# Patient Record
Sex: Female | Born: 1950 | Race: White | Hispanic: No | Marital: Married | Smoking: Never smoker
Health system: Southern US, Community
[De-identification: ages and names within clinical notes are randomized; demographics above are authoritative.]

## PROBLEM LIST (undated history)

## (undated) DIAGNOSIS — Z973 Presence of spectacles and contact lenses: Secondary | ICD-10-CM

## (undated) DIAGNOSIS — Z8489 Family history of other specified conditions: Secondary | ICD-10-CM

## (undated) DIAGNOSIS — M2550 Pain in unspecified joint: Secondary | ICD-10-CM

## (undated) DIAGNOSIS — R0989 Other specified symptoms and signs involving the circulatory and respiratory systems: Secondary | ICD-10-CM

## (undated) DIAGNOSIS — C73 Malignant neoplasm of thyroid gland: Secondary | ICD-10-CM

## (undated) DIAGNOSIS — H269 Unspecified cataract: Secondary | ICD-10-CM

## (undated) DIAGNOSIS — I10 Essential (primary) hypertension: Secondary | ICD-10-CM

## (undated) DIAGNOSIS — E079 Disorder of thyroid, unspecified: Secondary | ICD-10-CM

## (undated) DIAGNOSIS — C55 Malignant neoplasm of uterus, part unspecified: Secondary | ICD-10-CM

## (undated) DIAGNOSIS — E669 Obesity, unspecified: Secondary | ICD-10-CM

## (undated) DIAGNOSIS — K5900 Constipation, unspecified: Secondary | ICD-10-CM

## (undated) DIAGNOSIS — R197 Diarrhea, unspecified: Secondary | ICD-10-CM

## (undated) DIAGNOSIS — R109 Unspecified abdominal pain: Secondary | ICD-10-CM

## (undated) DIAGNOSIS — E785 Hyperlipidemia, unspecified: Secondary | ICD-10-CM

## (undated) DIAGNOSIS — G4730 Sleep apnea, unspecified: Secondary | ICD-10-CM

## (undated) DIAGNOSIS — Z8249 Family history of ischemic heart disease and other diseases of the circulatory system: Secondary | ICD-10-CM

## (undated) DIAGNOSIS — K649 Unspecified hemorrhoids: Secondary | ICD-10-CM

## (undated) DIAGNOSIS — K219 Gastro-esophageal reflux disease without esophagitis: Secondary | ICD-10-CM

## (undated) DIAGNOSIS — Z86718 Personal history of other venous thrombosis and embolism: Secondary | ICD-10-CM

## (undated) HISTORY — DX: Unspecified abdominal pain: R10.9

## (undated) HISTORY — DX: Hyperlipidemia, unspecified: E78.5

## (undated) HISTORY — DX: Unspecified cataract: H26.9

## (undated) HISTORY — PX: OTHER SURGICAL HISTORY: SHX169

## (undated) HISTORY — DX: Unspecified hemorrhoids: K64.9

## (undated) HISTORY — DX: Essential (primary) hypertension: I10

## (undated) HISTORY — DX: Malignant neoplasm of thyroid gland: C73

## (undated) HISTORY — DX: Presence of spectacles and contact lenses: Z97.3

## (undated) HISTORY — DX: Personal history of other venous thrombosis and embolism: Z86.718

## (undated) HISTORY — DX: Constipation, unspecified: K59.00

## (undated) HISTORY — DX: Malignant neoplasm of uterus, part unspecified: C55

## (undated) HISTORY — DX: Family history of ischemic heart disease and other diseases of the circulatory system: Z82.49

## (undated) HISTORY — PX: CHOLECYSTECTOMY: SHX55

## (undated) HISTORY — PX: APPENDECTOMY: SHX54

## (undated) HISTORY — DX: Obesity, unspecified: E66.9

## (undated) HISTORY — PX: HERNIA REPAIR: SHX51

## (undated) HISTORY — DX: Pain in unspecified joint: M25.50

## (undated) HISTORY — DX: Sleep apnea, unspecified: G47.30

## (undated) HISTORY — DX: Disorder of thyroid, unspecified: E07.9

## (undated) HISTORY — DX: Diarrhea, unspecified: R19.7

## (undated) HISTORY — DX: Other specified symptoms and signs involving the circulatory and respiratory systems: R09.89

## (undated) HISTORY — DX: Gastro-esophageal reflux disease without esophagitis: K21.9

## (undated) HISTORY — PX: TONSILLECTOMY AND ADENOIDECTOMY: SUR1326

## (undated) HISTORY — PX: ABDOMINAL HYSTERECTOMY: SHX81

---

## 1997-08-30 ENCOUNTER — Ambulatory Visit: Admission: RE | Admit: 1997-08-30 | Discharge: 1997-08-30 | Payer: Self-pay | Admitting: Internal Medicine

## 1998-08-29 ENCOUNTER — Other Ambulatory Visit: Admission: RE | Admit: 1998-08-29 | Discharge: 1998-08-29 | Payer: Self-pay | Admitting: Obstetrics and Gynecology

## 1999-10-22 ENCOUNTER — Other Ambulatory Visit: Admission: RE | Admit: 1999-10-22 | Discharge: 1999-10-22 | Payer: Self-pay | Admitting: Obstetrics and Gynecology

## 2000-11-11 ENCOUNTER — Other Ambulatory Visit: Admission: RE | Admit: 2000-11-11 | Discharge: 2000-11-11 | Payer: Self-pay | Admitting: Obstetrics and Gynecology

## 2001-02-13 ENCOUNTER — Ambulatory Visit (HOSPITAL_BASED_OUTPATIENT_CLINIC_OR_DEPARTMENT_OTHER): Admission: RE | Admit: 2001-02-13 | Discharge: 2001-02-13 | Payer: Self-pay | Admitting: Internal Medicine

## 2001-02-13 ENCOUNTER — Encounter: Payer: Self-pay | Admitting: Internal Medicine

## 2001-03-01 ENCOUNTER — Ambulatory Visit (HOSPITAL_BASED_OUTPATIENT_CLINIC_OR_DEPARTMENT_OTHER): Admission: RE | Admit: 2001-03-01 | Discharge: 2001-03-01 | Payer: Self-pay | Admitting: Orthopedic Surgery

## 2001-03-01 ENCOUNTER — Encounter (INDEPENDENT_AMBULATORY_CARE_PROVIDER_SITE_OTHER): Payer: Self-pay | Admitting: *Deleted

## 2002-01-11 ENCOUNTER — Other Ambulatory Visit: Admission: RE | Admit: 2002-01-11 | Discharge: 2002-01-11 | Payer: Self-pay | Admitting: Obstetrics and Gynecology

## 2002-01-16 ENCOUNTER — Encounter: Payer: Self-pay | Admitting: Obstetrics and Gynecology

## 2002-01-16 ENCOUNTER — Ambulatory Visit (HOSPITAL_COMMUNITY): Admission: RE | Admit: 2002-01-16 | Discharge: 2002-01-16 | Payer: Self-pay | Admitting: Obstetrics and Gynecology

## 2002-02-13 ENCOUNTER — Ambulatory Visit (HOSPITAL_COMMUNITY): Admission: RE | Admit: 2002-02-13 | Discharge: 2002-02-13 | Payer: Self-pay | Admitting: General Surgery

## 2002-02-13 ENCOUNTER — Encounter: Payer: Self-pay | Admitting: General Surgery

## 2002-04-20 ENCOUNTER — Ambulatory Visit (HOSPITAL_COMMUNITY): Admission: RE | Admit: 2002-04-20 | Discharge: 2002-04-20 | Payer: Self-pay | Admitting: Internal Medicine

## 2002-06-21 ENCOUNTER — Ambulatory Visit (HOSPITAL_COMMUNITY): Admission: RE | Admit: 2002-06-21 | Discharge: 2002-06-21 | Payer: Self-pay | Admitting: Specialist

## 2002-08-29 ENCOUNTER — Ambulatory Visit (HOSPITAL_COMMUNITY): Admission: RE | Admit: 2002-08-29 | Discharge: 2002-08-29 | Payer: Self-pay | Admitting: Specialist

## 2002-12-28 ENCOUNTER — Encounter: Payer: Self-pay | Admitting: Endocrinology

## 2002-12-28 ENCOUNTER — Ambulatory Visit (HOSPITAL_COMMUNITY): Admission: RE | Admit: 2002-12-28 | Discharge: 2002-12-28 | Payer: Self-pay | Admitting: Endocrinology

## 2003-01-04 ENCOUNTER — Encounter: Payer: Self-pay | Admitting: Obstetrics and Gynecology

## 2003-01-04 ENCOUNTER — Encounter: Admission: RE | Admit: 2003-01-04 | Discharge: 2003-01-04 | Payer: Self-pay | Admitting: Obstetrics and Gynecology

## 2003-02-22 ENCOUNTER — Other Ambulatory Visit: Admission: RE | Admit: 2003-02-22 | Discharge: 2003-02-22 | Payer: Self-pay | Admitting: Obstetrics and Gynecology

## 2003-05-03 ENCOUNTER — Encounter: Admission: RE | Admit: 2003-05-03 | Discharge: 2003-05-03 | Payer: Self-pay | Admitting: Surgery

## 2003-05-14 ENCOUNTER — Encounter: Admission: RE | Admit: 2003-05-14 | Discharge: 2003-08-12 | Payer: Self-pay | Admitting: Surgery

## 2003-05-24 ENCOUNTER — Ambulatory Visit (HOSPITAL_COMMUNITY): Admission: RE | Admit: 2003-05-24 | Discharge: 2003-05-24 | Payer: Self-pay | Admitting: Surgery

## 2003-08-13 ENCOUNTER — Encounter: Admission: RE | Admit: 2003-08-13 | Discharge: 2003-11-11 | Payer: Self-pay | Admitting: Surgery

## 2003-10-25 ENCOUNTER — Emergency Department (HOSPITAL_COMMUNITY): Admission: EM | Admit: 2003-10-25 | Discharge: 2003-10-25 | Payer: Self-pay | Admitting: Family Medicine

## 2003-11-11 ENCOUNTER — Encounter: Admission: RE | Admit: 2003-11-11 | Discharge: 2003-11-11 | Payer: Self-pay | Admitting: Surgery

## 2004-01-03 ENCOUNTER — Ambulatory Visit (HOSPITAL_COMMUNITY): Admission: RE | Admit: 2004-01-03 | Discharge: 2004-01-03 | Payer: Self-pay | Admitting: Endocrinology

## 2004-01-23 ENCOUNTER — Ambulatory Visit (HOSPITAL_COMMUNITY): Admission: RE | Admit: 2004-01-23 | Discharge: 2004-01-23 | Payer: Self-pay | Admitting: Endocrinology

## 2004-01-23 ENCOUNTER — Encounter (INDEPENDENT_AMBULATORY_CARE_PROVIDER_SITE_OTHER): Payer: Self-pay | Admitting: Specialist

## 2004-03-13 ENCOUNTER — Encounter: Admission: RE | Admit: 2004-03-13 | Discharge: 2004-03-13 | Payer: Self-pay | Admitting: Obstetrics and Gynecology

## 2004-05-04 ENCOUNTER — Other Ambulatory Visit: Admission: RE | Admit: 2004-05-04 | Discharge: 2004-05-04 | Payer: Self-pay | Admitting: Obstetrics and Gynecology

## 2004-11-06 ENCOUNTER — Ambulatory Visit (HOSPITAL_COMMUNITY): Admission: RE | Admit: 2004-11-06 | Discharge: 2004-11-06 | Payer: Self-pay | Admitting: Endocrinology

## 2004-11-27 ENCOUNTER — Encounter (INDEPENDENT_AMBULATORY_CARE_PROVIDER_SITE_OTHER): Payer: Self-pay | Admitting: *Deleted

## 2004-11-27 ENCOUNTER — Ambulatory Visit (HOSPITAL_COMMUNITY): Admission: RE | Admit: 2004-11-27 | Discharge: 2004-11-27 | Payer: Self-pay | Admitting: Interventional Cardiology

## 2005-04-13 ENCOUNTER — Encounter: Admission: RE | Admit: 2005-04-13 | Discharge: 2005-04-13 | Payer: Self-pay | Admitting: Obstetrics and Gynecology

## 2005-05-23 ENCOUNTER — Encounter: Admission: RE | Admit: 2005-05-23 | Discharge: 2005-05-23 | Payer: Self-pay | Admitting: Specialist

## 2005-08-06 ENCOUNTER — Inpatient Hospital Stay (HOSPITAL_COMMUNITY): Admission: EM | Admit: 2005-08-06 | Discharge: 2005-08-17 | Payer: Self-pay | Admitting: Emergency Medicine

## 2005-08-25 ENCOUNTER — Encounter: Admission: RE | Admit: 2005-08-25 | Discharge: 2005-08-25 | Payer: Self-pay | Admitting: General Surgery

## 2005-08-26 ENCOUNTER — Inpatient Hospital Stay (HOSPITAL_COMMUNITY): Admission: EM | Admit: 2005-08-26 | Discharge: 2005-09-04 | Payer: Self-pay | Admitting: General Surgery

## 2006-02-07 ENCOUNTER — Other Ambulatory Visit: Admission: RE | Admit: 2006-02-07 | Discharge: 2006-02-07 | Payer: Self-pay | Admitting: Obstetrics & Gynecology

## 2006-02-23 ENCOUNTER — Ambulatory Visit: Admission: RE | Admit: 2006-02-23 | Discharge: 2006-02-23 | Payer: Self-pay | Admitting: Gynecologic Oncology

## 2006-03-01 ENCOUNTER — Ambulatory Visit: Admission: RE | Admit: 2006-03-01 | Discharge: 2006-05-30 | Payer: Self-pay | Admitting: Radiation Oncology

## 2006-03-02 ENCOUNTER — Ambulatory Visit (HOSPITAL_COMMUNITY): Admission: RE | Admit: 2006-03-02 | Discharge: 2006-03-02 | Payer: Self-pay | Admitting: Gynecologic Oncology

## 2006-04-15 ENCOUNTER — Encounter: Admission: RE | Admit: 2006-04-15 | Discharge: 2006-04-15 | Payer: Self-pay | Admitting: Obstetrics & Gynecology

## 2006-04-26 ENCOUNTER — Encounter: Payer: Self-pay | Admitting: Internal Medicine

## 2006-05-04 ENCOUNTER — Ambulatory Visit: Payer: Self-pay | Admitting: Internal Medicine

## 2006-05-19 ENCOUNTER — Ambulatory Visit: Payer: Self-pay

## 2006-05-19 ENCOUNTER — Encounter: Payer: Self-pay | Admitting: Cardiology

## 2006-05-20 ENCOUNTER — Ambulatory Visit: Payer: Self-pay

## 2006-05-23 ENCOUNTER — Ambulatory Visit: Payer: Self-pay | Admitting: Internal Medicine

## 2006-05-24 ENCOUNTER — Ambulatory Visit (HOSPITAL_COMMUNITY): Admission: RE | Admit: 2006-05-24 | Discharge: 2006-05-24 | Payer: Self-pay | Admitting: Gynecologic Oncology

## 2006-05-25 ENCOUNTER — Encounter (INDEPENDENT_AMBULATORY_CARE_PROVIDER_SITE_OTHER): Payer: Self-pay | Admitting: Specialist

## 2006-05-25 ENCOUNTER — Ambulatory Visit (HOSPITAL_COMMUNITY): Admission: RE | Admit: 2006-05-25 | Discharge: 2006-05-25 | Payer: Self-pay | Admitting: Gynecologic Oncology

## 2006-05-26 ENCOUNTER — Ambulatory Visit (HOSPITAL_COMMUNITY): Admission: RE | Admit: 2006-05-26 | Discharge: 2006-05-26 | Payer: Self-pay | Admitting: Gynecologic Oncology

## 2006-06-07 ENCOUNTER — Ambulatory Visit (HOSPITAL_COMMUNITY): Admission: RE | Admit: 2006-06-07 | Discharge: 2006-06-07 | Payer: Self-pay | Admitting: Endocrinology

## 2006-06-07 ENCOUNTER — Ambulatory Visit: Payer: Self-pay | Admitting: Vascular Surgery

## 2006-07-05 ENCOUNTER — Inpatient Hospital Stay (HOSPITAL_COMMUNITY): Admission: RE | Admit: 2006-07-05 | Discharge: 2006-07-07 | Payer: Self-pay | Admitting: General Surgery

## 2006-07-05 ENCOUNTER — Encounter (INDEPENDENT_AMBULATORY_CARE_PROVIDER_SITE_OTHER): Payer: Self-pay | Admitting: *Deleted

## 2006-08-30 ENCOUNTER — Ambulatory Visit: Admission: RE | Admit: 2006-08-30 | Discharge: 2006-08-30 | Payer: Self-pay | Admitting: Gynecologic Oncology

## 2006-12-13 ENCOUNTER — Ambulatory Visit (HOSPITAL_COMMUNITY): Admission: RE | Admit: 2006-12-13 | Discharge: 2006-12-13 | Payer: Self-pay | Admitting: General Surgery

## 2007-01-05 ENCOUNTER — Encounter: Admission: RE | Admit: 2007-01-05 | Discharge: 2007-04-05 | Payer: Self-pay | Admitting: General Surgery

## 2007-02-22 ENCOUNTER — Ambulatory Visit: Admission: RE | Admit: 2007-02-22 | Discharge: 2007-02-22 | Payer: Self-pay | Admitting: Gynecologic Oncology

## 2007-02-22 ENCOUNTER — Other Ambulatory Visit: Admission: RE | Admit: 2007-02-22 | Discharge: 2007-02-22 | Payer: Self-pay | Admitting: Gynecologic Oncology

## 2007-02-22 ENCOUNTER — Encounter: Payer: Self-pay | Admitting: Gynecologic Oncology

## 2007-03-20 ENCOUNTER — Ambulatory Visit (HOSPITAL_COMMUNITY): Admission: RE | Admit: 2007-03-20 | Discharge: 2007-03-20 | Payer: Self-pay | Admitting: General Surgery

## 2007-04-04 ENCOUNTER — Ambulatory Visit (HOSPITAL_COMMUNITY): Admission: RE | Admit: 2007-04-04 | Discharge: 2007-04-05 | Payer: Self-pay | Admitting: General Surgery

## 2007-04-04 HISTORY — PX: GASTRIC RESTRICTION SURGERY: SHX653

## 2007-04-18 ENCOUNTER — Encounter: Admission: RE | Admit: 2007-04-18 | Discharge: 2007-04-18 | Payer: Self-pay | Admitting: General Surgery

## 2007-04-19 ENCOUNTER — Encounter: Admission: RE | Admit: 2007-04-19 | Discharge: 2007-04-19 | Payer: Self-pay | Admitting: Obstetrics & Gynecology

## 2007-04-25 ENCOUNTER — Encounter: Admission: RE | Admit: 2007-04-25 | Discharge: 2007-04-25 | Payer: Self-pay | Admitting: Obstetrics & Gynecology

## 2007-07-04 ENCOUNTER — Other Ambulatory Visit: Admission: RE | Admit: 2007-07-04 | Discharge: 2007-07-04 | Payer: Self-pay | Admitting: Obstetrics and Gynecology

## 2007-07-04 ENCOUNTER — Encounter: Admission: RE | Admit: 2007-07-04 | Discharge: 2007-07-04 | Payer: Self-pay | Admitting: General Surgery

## 2007-08-16 ENCOUNTER — Encounter: Admission: RE | Admit: 2007-08-16 | Discharge: 2007-10-03 | Payer: Self-pay | Admitting: General Surgery

## 2007-10-24 ENCOUNTER — Encounter: Payer: Self-pay | Admitting: Gynecologic Oncology

## 2007-10-24 ENCOUNTER — Ambulatory Visit: Admission: RE | Admit: 2007-10-24 | Discharge: 2007-10-24 | Payer: Self-pay | Admitting: Gynecologic Oncology

## 2007-10-24 ENCOUNTER — Other Ambulatory Visit: Admission: RE | Admit: 2007-10-24 | Discharge: 2007-10-24 | Payer: Self-pay | Admitting: Gynecologic Oncology

## 2007-11-23 ENCOUNTER — Encounter: Admission: RE | Admit: 2007-11-23 | Discharge: 2007-11-23 | Payer: Self-pay | Admitting: Endocrinology

## 2008-03-15 ENCOUNTER — Encounter: Admission: RE | Admit: 2008-03-15 | Discharge: 2008-03-15 | Payer: Self-pay | Admitting: General Surgery

## 2008-03-26 ENCOUNTER — Other Ambulatory Visit: Admission: RE | Admit: 2008-03-26 | Discharge: 2008-03-26 | Payer: Self-pay | Admitting: Gynecologic Oncology

## 2008-03-26 ENCOUNTER — Ambulatory Visit: Admission: RE | Admit: 2008-03-26 | Discharge: 2008-03-26 | Payer: Self-pay | Admitting: Gynecologic Oncology

## 2008-05-15 ENCOUNTER — Encounter: Admission: RE | Admit: 2008-05-15 | Discharge: 2008-05-15 | Payer: Self-pay | Admitting: Gynecologic Oncology

## 2008-10-16 ENCOUNTER — Other Ambulatory Visit: Admission: RE | Admit: 2008-10-16 | Discharge: 2008-10-16 | Payer: Self-pay | Admitting: Gynecologic Oncology

## 2008-10-16 ENCOUNTER — Encounter: Payer: Self-pay | Admitting: Gynecologic Oncology

## 2008-10-16 ENCOUNTER — Ambulatory Visit: Admission: RE | Admit: 2008-10-16 | Discharge: 2008-10-16 | Payer: Self-pay | Admitting: Gynecologic Oncology

## 2008-10-16 ENCOUNTER — Encounter: Payer: Self-pay | Admitting: Internal Medicine

## 2009-04-30 ENCOUNTER — Ambulatory Visit: Admission: RE | Admit: 2009-04-30 | Discharge: 2009-04-30 | Payer: Self-pay | Admitting: Gynecologic Oncology

## 2009-04-30 ENCOUNTER — Other Ambulatory Visit: Admission: RE | Admit: 2009-04-30 | Discharge: 2009-04-30 | Payer: Self-pay | Admitting: Gynecologic Oncology

## 2009-04-30 ENCOUNTER — Encounter: Payer: Self-pay | Admitting: Internal Medicine

## 2009-05-27 ENCOUNTER — Encounter: Admission: RE | Admit: 2009-05-27 | Discharge: 2009-05-27 | Payer: Self-pay | Admitting: Gynecologic Oncology

## 2009-06-05 ENCOUNTER — Encounter: Admission: RE | Admit: 2009-06-05 | Discharge: 2009-06-05 | Payer: Self-pay | Admitting: Gynecologic Oncology

## 2009-09-16 ENCOUNTER — Ambulatory Visit (HOSPITAL_COMMUNITY): Admission: RE | Admit: 2009-09-16 | Discharge: 2009-09-16 | Payer: Self-pay | Admitting: General Surgery

## 2009-12-16 ENCOUNTER — Ambulatory Visit: Payer: Self-pay | Admitting: Family Medicine

## 2009-12-16 ENCOUNTER — Encounter: Admission: RE | Admit: 2009-12-16 | Discharge: 2009-12-16 | Payer: Self-pay | Admitting: Gynecologic Oncology

## 2010-01-04 ENCOUNTER — Telehealth: Payer: Self-pay | Admitting: Internal Medicine

## 2010-01-04 DIAGNOSIS — G4733 Obstructive sleep apnea (adult) (pediatric): Secondary | ICD-10-CM | POA: Insufficient documentation

## 2010-01-15 ENCOUNTER — Telehealth (INDEPENDENT_AMBULATORY_CARE_PROVIDER_SITE_OTHER): Payer: Self-pay | Admitting: *Deleted

## 2010-01-29 ENCOUNTER — Encounter: Payer: Self-pay | Admitting: Internal Medicine

## 2010-02-22 ENCOUNTER — Encounter: Payer: Self-pay | Admitting: Internal Medicine

## 2010-03-19 ENCOUNTER — Encounter: Payer: Self-pay | Admitting: Internal Medicine

## 2010-03-20 ENCOUNTER — Ambulatory Visit: Payer: Self-pay | Admitting: Internal Medicine

## 2010-03-26 DIAGNOSIS — E785 Hyperlipidemia, unspecified: Secondary | ICD-10-CM | POA: Insufficient documentation

## 2010-03-26 DIAGNOSIS — E7800 Pure hypercholesterolemia, unspecified: Secondary | ICD-10-CM | POA: Insufficient documentation

## 2010-03-26 DIAGNOSIS — E119 Type 2 diabetes mellitus without complications: Secondary | ICD-10-CM | POA: Insufficient documentation

## 2010-04-25 ENCOUNTER — Encounter: Payer: Self-pay | Admitting: Surgery

## 2010-04-26 ENCOUNTER — Encounter: Payer: Self-pay | Admitting: Gynecologic Oncology

## 2010-04-26 ENCOUNTER — Encounter: Payer: Self-pay | Admitting: General Surgery

## 2010-04-26 ENCOUNTER — Encounter: Payer: Self-pay | Admitting: Endocrinology

## 2010-04-29 ENCOUNTER — Other Ambulatory Visit: Payer: Self-pay | Admitting: Endocrinology

## 2010-04-29 DIAGNOSIS — N6310 Unspecified lump in the right breast, unspecified quadrant: Secondary | ICD-10-CM

## 2010-05-05 NOTE — Progress Notes (Signed)
Summary: Asks replacement CPAP mask; c/o sleepy.  Phone Note Other Incoming   Summary of Call: Mrs Whicker was dx'd with OSA by NPSG 02/13/01. Sh writes now reporting mask is worn out and asking replacement. She has been stil using at pressure 12, but notes that she has been more tired in the afternoon and evening. I will order new mask, suggest autotitration for pressure check, and ask her to make an appointment here so we have a way to follow and help her with this.  Initial call taken by: Waymon Budge MD,  January 04, 2010 4:07 PM  Follow-up for Phone Call        Order sent to [facility] for a replacement cpap mask and supplies. Obtain chart from [facility]. Per Dr Maple Hudson, pt will need to schedule an appt to get established here as a patient.  Called pt to schedule an appt. LMOAM for pt to return my call. Rhonda Cobb  January 05, 2010 9:29 AM  Pt returned my call and advised pt that order has been faxed to [facility] to provide cpap mask, supplies, filters  Pt also scheduled an appt to be seen here 02/18/10 at 9:00. Alfonso Ramus  January 05, 2010 5:18 PM   New Problems: OBSTRUCTIVE SLEEP APNEA (ICD-327.23)   New Problems: OBSTRUCTIVE SLEEP APNEA (ICD-327.23) New/Updated Medications: * CPAP 12 ADVANCED  * REPLACEMENT CPAP MASK OF CHOICE AND SUPPLIES Advanced

## 2010-05-05 NOTE — Progress Notes (Signed)
Summary: cpap not working  Phone Note Call from Patient   Caller: Patient Call For: Dr. Maple Hudson Summary of Call: [phone] Pt scheduled appt to re-establish care here on 02/18/10. Pt went by [facility] to get new cpap mask and supplies. Pt took her cpap in with her. Pt has an old s6 device.  Buford Dresser with [facility] called me as well stating that pt's s6 device was loosing pressure when new cpap mask was applied and machine was turned on, pressure was dropping.  Pt is aware of appt and does plan on keeping but requesting an order for a new machine. Please advise. Initial call taken by: Alfonso Ramus,  January 15, 2010 5:10 PM     Appended Document: cpap not working Order faxed to [facility] to provide pt a replacement CPAP. Reminded to keep upcoming appt in Nov.

## 2010-05-05 NOTE — Consult Note (Signed)
[facility]   Imported By: Roderic Ovens 11/18/2008 12:27:17  _____________________________________________________________________  External Attachment:    Type:   Image     Comment:   External Document

## 2010-05-05 NOTE — Consult Note (Signed)
[facility]   Imported By: Roderic Ovens 05/02/2009 14:03:13  _____________________________________________________________________  External Attachment:    Type:   Image     Comment:   External Document

## 2010-05-05 NOTE — Miscellaneous (Signed)
Summary: CPAP Set Up Info/[facility]  CPAP Set Up Info/[facility]   Imported By: Sherian Rein 02/05/2010 13:47:49  _____________________________________________________________________  External Attachment:    Type:   Image     Comment:   External Document

## 2010-05-05 NOTE — Letter (Signed)
Summary: [facility]  [facility]   Imported By: Lester Carlisle 03/02/2010 09:36:47  _____________________________________________________________________  External Attachment:    Type:   Image     Comment:   External Document

## 2010-05-07 ENCOUNTER — Ambulatory Visit: Payer: Commercial Managed Care - PPO | Admitting: Family Medicine

## 2010-05-07 ENCOUNTER — Encounter: Payer: Self-pay | Admitting: Family Medicine

## 2010-05-07 ENCOUNTER — Ambulatory Visit: Payer: Self-pay | Admitting: Family Medicine

## 2010-05-07 NOTE — Assessment & Plan Note (Signed)
Summary: RE-ESTABLISH/OSA/LAST SEEN 2004/[facility]   Primary Provider/Referring Provider:  Ardyth Harps, MD  CC:  Reestablish sleep apnea; former patient; CPAP machine [facility])..  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 OSA dxd 02/13/01 with excessive somnolence. NPSG RDI 26. Very compliant since then w/ CPAP all night, very night at 12. Much improved snoring and tiredness. This machine is one month old, used w/ nasal mask. Chooses not to use humidifier. Still sometimes tired in boring meetings. She feels more tired lately than usual, w/onoing any specific change. Bedtime 11 PM, short latency, up 630, sleeps late on Saturdays, no waking during night.  We reviewed her old chart, medical hx and meds updated with conversion to EMR.  Preventive Screening-Counseling & Management  Alcohol-Tobacco     Smoking Status: never  Current Medications (verified): 1)  Cpap 12 Advanced 2)  Replacement Cpap Mask of Choice and Supplies .... Advanced 3)  Synthroid 200 Mcg Tabs (Levothyroxine Sodium) .... Take 1 By Mouth Once Daily 4)  Ramipril 10 Mg Caps (Ramipril) .... 2 By Mouth Once Daily 5)  Clonidine Hcl 0.2 Mg Tabs (Clonidine Hcl) .... Take 1po Once Daily 6)  Protonix 40 Mg Solr (Pantoprazole Sodium) .... Take  1po Once Daily 7)  Aspirin 81 Mg Tbec (Aspirin) .... Take 1 By Mouth Once Daily 8)  Lantus Solostar 100 Unit/ml Soln (Insulin Glargine) .Marland Kitchen.. 10-25 Units As Needed 9)  Crestor 20 Mg Tabs (Rosuvastatin Calcium) .... Take 1 By Mouth Once Daily  Allergies (verified): 1)  ! Ampicillin 2)  ! Codeine 3)  ! 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  The patient denies shortness of breath with activity, shortness of breath at rest, productive cough, non-productive cough, coughing up blood, chest pain, irregular heartbeats, indigestion, loss of appetite, weight change, abdominal pain, difficulty swallowing, sore throat, tooth/dental problems, headaches, nasal congestion/difficulty breathing through nose, sneezing, ear ache, anxiety, depression, joint stiffness or pain, change in color of mucus, and fever.    Vital Signs:  Patient profile:   60 year old female Height:      68 inches Weight:      375.50 pounds BMI:     57.30 Cuff size:   large  Vitals Entered By: Reynaldo Minium CMA (March 20, 2010 4:11 PM)  O2 Flow:  Room air CC: Reestablish sleep apnea; former patient; CPAP machine [facility]).   Physical Exam  Additional Exam:  General: A/Ox3; pleasant and cooperative, NAD, alert intelligent, morbidly overweight SKIN: no rash, lesions NODES: no lymphadenopathy HEENT: [place]/AT, EOM- WNL, Conjuctivae- clear,  PERRLA, TM-WNL, Nose- clear, Throat- clear and wnl, Mallampati  III NECK: Supple w/ fair ROM, JVD- none, normal carotid impulses w/o bruits Thyroid- normal to palpation CHEST: Clear to P&A HEART: RRR, no m/g/r  heard ABDOMEN: significant pannus EAV:WUJW, nl pulses, no edema  NEURO: Grossly intact to observation       Impression & Recommendations:  Problem # 1:  OBSTRUCTIVE SLEEP APNEA (ICD-327.23)  Great compliance and control Not clear why she seems to be feeling sleepier in the mornings now despite apparently enough sleep. We did suggest she consider caffeine tab occasionally. She doesnt feel she needs more help than that. Nap when able. We discussed markers that might call for sleep testing.   Medications Added to Medication List This Visit: 1)  Synthroid 200 Mcg Tabs (Levothyroxine sodium) .... Take 1 by mouth once daily 2)  Ramipril 10 Mg Caps (Ramipril) .... 2 by mouth once daily 3)  Clonidine Hcl 0.2 Mg Tabs (Clonidine hcl) .... Take 1po once daily 4)  Protonix 40 Mg Solr (Pantoprazole sodium) .... Take  1po once daily 5)  Aspirin 81 Mg Tbec (Aspirin) .... Take 1 by mouth once daily 6)  Lantus Solostar 100 Unit/ml Soln (Insulin glargine) .Marland Kitchen.. 10-25 units as needed 7)  Crestor 20 Mg Tabs (Rosuvastatin calcium) .... Take 1 by mouth once daily  Other Orders: Est. Patient Level IV (11914)  Patient Instructions: 1)  Please schedule a follow-up appointment in 1 year. 2)  Continue CPAP at 12. Please call as needed if anything is uncomfortable.

## 2010-05-13 NOTE — Assessment & Plan Note (Signed)
Summary: nutr. appt/eo   Vital Signs:  Patient profile:   60 year old female Height:      68 inches  Vitals Entered By: Wyona Almas PHD (May 07, 2010 4:16 PM)  Primary Care Provider:  Ardyth Harps, MD   History of Present Illness: Assessment:  Spent 60 min w/ pt.   No wt chk today per pt request.  24-hr recall suggests intake of 1300-1400 kcal: B (7 AM)- 8-11 oz Advantage Shake (17 pro, 1 CHO); Snk (AM)- 4 c coffee w/ Splenda, each w/ 3 tsp half & half; L (PM)- 1 1/2  c crm of broc soup, 3 club crackers; Snk (3 PM)- 1 banana, 4:30- small butterfinger; napped 7-8:30; D (PM)- 1 c chili, 3 saltines, 1 small Frosty; 1 more Advantage shake before bed.  FBG has been running 120s to 140. Bedtime (2-3 hr post-prand) BG has usually been  ~150.  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  She usually does not have problems with F & Vs or with carb's.  [name] continues to walk a bit more, but has not been tracking progress on this or other goals.    Nutrition Diagnosis:  Physical inactivity (NB-2.1) related to poor fitness and SOB as evidenced by exercise limited to walking from parking lot at work and occasional other short walks.  Inappropriate intake of types of carbohydrate (NI-53.3) related to sweets as evidenced by  ~daily intake of sweets.    Intervention:  See Patient Instructions.    Monitoring/Eval:  Dietary intake, body weight, and exercise at 2-4-wk F/U.    Allergies: 1)  ! Ampicillin 2)  ! Codeine 3)  ! Daypro   Complete Medication List: 1)  Cpap 12 Advanced  2)  Replacement Cpap Mask of Choice and Supplies  .... Advanced 3)  Synthroid 200 Mcg Tabs (Levothyroxine sodium) .... Take 1 by mouth once daily 4)  Ramipril 10 Mg Caps  (Ramipril) .... 2 by mouth once daily 5)  Clonidine Hcl 0.2 Mg Tabs (Clonidine hcl) .... Take 1po once daily 6)  Protonix 40 Mg Solr (Pantoprazole sodium) .... Take  1po once daily 7)  Aspirin 81 Mg Tbec (Aspirin) .... Take 1 by mouth once daily 8)  Lantus Solostar 100 Unit/ml Soln (Insulin glargine) .Marland Kitchen.. 10-25 units as needed 9)  Crestor 20 Mg Tabs (Rosuvastatin calcium) .... Take 1 by mouth once daily  Other Orders: Inital Assessment Each - [facility] [phone])  Patient Instructions: 1)  Cereal:  Choose ones with at least 5 grams of fiber per serving.  And have lots of milk with your cereal.   2)  Track breakfast:  what, how much, and what time; and write down time at which you first notice hunger.   3)  Advance planning:  Protein at each meal.  Veg's 2 X day.   4)  Sweets:  Having alternatives on hand will be essential, i.e., fruit, Advantage shake, string cheese, homemade trail mix.   5)  Check out [place].com re. diet for diabetes.   6)  Explore further [employer] [facility] [employer].   7)  Journal on each of these goals discussed today.  Bring to follow-up appt.     Orders Added: 1)  Inital Assessment  Each [facility] [[id number]]

## 2010-05-28 ENCOUNTER — Ambulatory Visit (INDEPENDENT_AMBULATORY_CARE_PROVIDER_SITE_OTHER): Payer: Commercial Managed Care - PPO | Admitting: Family Medicine

## 2010-05-28 ENCOUNTER — Other Ambulatory Visit: Payer: Self-pay | Admitting: Endocrinology

## 2010-05-28 ENCOUNTER — Ambulatory Visit
Admission: RE | Admit: 2010-05-28 | Discharge: 2010-05-28 | Disposition: A | Discharge status: Home / Self Care | Payer: Commercial Managed Care - PPO | Source: Ambulatory Visit | Attending: Endocrinology | Admitting: Endocrinology

## 2010-05-28 DIAGNOSIS — E119 Type 2 diabetes mellitus without complications: Secondary | ICD-10-CM

## 2010-05-28 DIAGNOSIS — N6310 Unspecified lump in the right breast, unspecified quadrant: Secondary | ICD-10-CM

## 2010-05-28 NOTE — Progress Notes (Signed)
Medical Nutrition Therapy:  Appt start time: 1600 end time:  1700.  Assessment:  Primary concerns today: Weight management and Blood sugar control. We spoke at length today re. Chaniyah's need for weight loss, and obstacles to making progress in this effort. Identified obstacles include the perception that her weight loss needs are so great as to be overwhelming, possibly a lack of vision of being successful in these efforts, and poor support at home (i.e., family members not on board) and at work (frequent baked goods provided at work), and not making self-care a high priority (caring for others tends to come ahead of self).  Shalona has looked into [employer] at [employer], but has not explored possibility of insurance coverage, etc.    Progress Towards Goal(s):  In progress.   Nutrition Diagnosis: Physical inactivity (NB-2.1) related to poor fitness and SOB as evidenced by exercise limited to walking from parking lot at work and occasional other short walks. Inappropriate intake of types of carbohydrate (NI-53.3) related to sweets as evidenced by ~daily intake of sweets.     Intervention:  Nutrition counseling.   Monitoring/Evaluation:  Dietary intake in 3 week(s).

## 2010-05-28 NOTE — Patient Instructions (Addendum)
-   Focus on increments of 50-lb weight loss.   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   - Google Lafonda Mosses and check out the site I email you for a Learnitlive session.   - Mission, Vision, Values statements for YOU, along with a plan.   - Email [name] your plan no later than Monday.  (Consider sugar-related goal.)

## 2010-06-11 ENCOUNTER — Ambulatory Visit: Payer: Commercial Managed Care - PPO | Admitting: Family Medicine

## 2010-06-16 ENCOUNTER — Ambulatory Visit (INDEPENDENT_AMBULATORY_CARE_PROVIDER_SITE_OTHER): Payer: Commercial Managed Care - PPO | Admitting: Family Medicine

## 2010-06-16 DIAGNOSIS — E119 Type 2 diabetes mellitus without complications: Secondary | ICD-10-CM

## 2010-06-16 NOTE — Patient Instructions (Addendum)
-   Look up theory re. "wheat belly" AND investigate Lafonda Mosses (listen to one of his videos). - Sports nutrition protein drinks without wheat (I think) or whey:  Regen ([place]); ProStat 101.  - Put to use your new diet and activity journal, recording # minutes of walking as well as food and beverages daily.   - Plan ahead for days when you have meetings with meals; bring your own/part of the meal (fruit or veg's).  Also bring snacks to work.   - Develop a list of foods that predictably work well for you AND that meet your nutritional needs.  - Experiment with the Eat Right for Your Type diet.   - Reminder:  Adequate protein is going to be essential to your appetite control: Protein at every meal.

## 2010-06-16 NOTE — Progress Notes (Signed)
Medical Nutrition Therapy: Appt start time: 0900 end time: 1000.   Assessment: Primary concerns today: Blood sugar control.  [name] emailed me her Mission, Vision, Values strategies, outlining behavioral goals she feels will help her to stay on track. Unfortunately, her husband has not been especially supportive in this process, but [name] realizes she needs to do this for herself. She has not paid close attn to her goals so far, and I think more frequent accountability will help in that. [name] has been doing more research into dietary strategies and treatment regimens. She is intrigued by the Eat Right 4 Your Type diet, which indicates that she (Type O) should avoid basically all the foods she currently eats and likes, i.e., sugar, refined starches, eggs, dairy, and pork.  [name] has been walking further from the parking lot, and walking in the hospital more.   Nutrition Diagnosis: Some progress on physical inactivity (NB-2.1) related to poor fitness and SOB as evidenced by increased walking at work.  No change in inappropriate intake of types of carbohydrate (NI-53.3) related to sweets as evidenced by ~daily intake of sweets.   Intervention: Nutrition counseling.   Monitoring/Evaluation: Dietary intake in 3 week(s).

## 2010-07-09 ENCOUNTER — Ambulatory Visit (INDEPENDENT_AMBULATORY_CARE_PROVIDER_SITE_OTHER): Payer: Commercial Managed Care - PPO | Admitting: Family Medicine

## 2010-07-09 DIAGNOSIS — E119 Type 2 diabetes mellitus without complications: Secondary | ICD-10-CM

## 2010-07-09 NOTE — Progress Notes (Signed)
Medical Nutrition Therapy: Appt start time: 1600 end time: 1700.   Assessment: Primary concerns today: Blood sugar control.  Changes [name] has made:  She's continued to walk more at work and also sometimes shopping in the evenings; has been doing better at getting protein, but still not at every meal; still relying on Advantage protein drink ~2 X day; eating more fruit & veg's; has not kept a food record.  [name] has come to realize the value of self-acceptance to her behavior change efforts.  She has not yet researched information on sugar and [name], as suggested at last appt, but [name] said she has become much more aware of some of her choices and thinking, and for the first time in a long time, she is optimistic about making changes she needs to make.  She is going to  have her lap band checked, which probably won't happen till July b/c of the need to be at work for [employer] prior to then.  [name] is just now getting fully into contemplation stage in her readiness to change in terms of diet.  I would like to see her do some more exploration of feelings and motivation behind her food choices before recommending specific food goals.    Nutrition Diagnosis:  Stable progress on physical inactivity (NB-2.1) related to poor fitness and SOB as evidenced by increased walking at work.  No change in inappropriate intake of types of carbohydrate (NI-53.3) related to sweets as evidenced by ~daily intake of sweets.   Intervention: Nutrition counseling.   Monitoring/Evaluation: Dietary intake in 4 weeks.

## 2010-07-09 NOTE — Patient Instructions (Addendum)
-   Call to check on insurance coverage for getting your lap band checked.   - Google [name] and his info on sugar.   - Conscious food decisions:  Think ahead about your options, i.e., should I bring cake for Easter?  - Three Qs of a good food decision:  What am I in the mood for?; How hungry am I?; What's good for me? - Journal re. food decisions:  What are the antecedents to poor or good food choices?  Look for trends.   - Also include in your journal:  What are the obstacles to continuing to journal. - Continue to do as much walking as you can.

## 2010-08-04 ENCOUNTER — Ambulatory Visit: Payer: Commercial Managed Care - PPO | Admitting: Family Medicine

## 2010-08-18 ENCOUNTER — Ambulatory Visit (INDEPENDENT_AMBULATORY_CARE_PROVIDER_SITE_OTHER): Payer: Commercial Managed Care - PPO | Admitting: Family Medicine

## 2010-08-18 DIAGNOSIS — E119 Type 2 diabetes mellitus without complications: Secondary | ICD-10-CM

## 2010-08-18 NOTE — Progress Notes (Signed)
Medical Nutrition Therapy: Appt start time: 1600 end time: 1700.   Assessment: Primary concerns today: Blood sugar control.  Dareth said she has thought about all of these recommendations "constantly," but has not managed to make changes other than walking more.  Tyreanna is drinking an Atkins protein shake for breakfast (17 g protein) and at bedtime, and sometimes at mid-morning.  We talked about whether or not Saraiah really believes success at weight loss is possible, and that this sense of empowerment is likely crucial to success.  In addition, I brought up the possibility that fear of failure has been keeping Ritta from getting started on these changes in a meaningful way.  To address both of these concerns, I suggested Jasiah focus more narrowly on changes, which will make all of this effort less daunting, and Aviendha will start keeping a success journal.   Nutrition Diagnosis:  Stable progress on physical inactivity (NB-2.1) related to poor fitness and SOB as evidenced by increased walking at work.  No change in inappropriate intake of types of carbohydrate (NI-53.3) related to sweets as evidenced by ~daily intake of sweets.   Intervention: Nutrition counseling.   Monitoring/Evaluation: Dietary intake in 4 weeks.

## 2010-08-18 NOTE — Consult Note (Signed)
NAMEDORISANN, [name]                ACCOUNT NO.:  [id number]   MEDICAL RECORD NO.:  [id number]          PATIENT TYPE:  OUT   LOCATION:  GYN                          FACILITY[facility]  [facility]   PHYSICIAN:  Paola A. Duard Brady, MD    DATE OF BIRTH:  02-13-1951   DATE OF CONSULTATION:  10/24/2007  DATE OF DISCHARGE:                                 CONSULTATION   [name] is a very pleasant 60 year old with a history of a stage IA,  grade 1 endometrial carcinoma who underwent exploratory laparotomy, TAH-  BSO and partial ventral hernia repair November 04, 2006.  She had been on  progestational agents which failed.  Her pathology did reveal a grade 1  endometrial carcinoma with no invasion.  Cervix and bilateral adnexa  were negative.  Lymph nodes were not performed secondary to physical  limitations.  She has been doing well and has had no evidence of  recurrent disease since that time.  She did undergo gastric bypass  surgery consisting of lap band in December 2008 has lost approximately  40 pounds since that time.  She does admit to not being as compliant as  she could be as she will eat some sweets and desserts, but she is  overall feeling much better and is walking.  She recently had a  mammogram that showed questionable breast cancer involving lymph nodes  in the bilateral axillae.  She was seen by Dr. Isaiah Serge and had biopsies  on the left side of the axillary lymph nodes which were negative.  It is  unclear what the followup is going to be for that.  She states that she  is due for repeat mammography.  The only change in her medication is  that she is now on vitamin D 5000 units per week, and her vitamin D  level has risen from the mid 20s to 65.  From a gynecologic standpoint,  she denies any bleeding, any change in bowel or bladder habits.  She  does complain of some urinary incontinence.  She denies any nausea,  vomiting, fevers, chills, headaches or visual changes.   PHYSICAL EXAMINATION:   VITAL SIGNS:  Weight 338 pounds which is down  from 374 pounds in November 2008.  GENERAL:  A well-nourished, well-developed female in no acute distress.  NECK:  Supple.  There is no lymphadenopathy, no thyromegaly.  LUNGS:  Clear to auscultation with distant breath sounds.  CARDIOVASCULAR:  Regular rate and rhythm.  ABDOMEN:  Notable for a large pannus.  There is a large midline incision  with multiple hernias palpable.  There is some eschar overlying the  incision.  There is no purulence.  There is no erythema.  Groins are  negative for adenopathy, but the exam is limited.  EXTREMITIES:  She has  1+ pitting edema equal bilaterally with some chronic venous stasis  changes.  PELVIC:  External genitalia is within normal limits.  The vagina is  markedly atrophic.  The vaginal cuff is visualized.  There are no  visible lesions.  ThinPrep Pap smear was submitted without difficulty.  Bimanual  examination is limited by the patient's habitus.  However, on  rectovaginal examination there is no nodularity or masses.  Abdominal  examination is noncontributory.   ASSESSMENT:  A 60 year old with clinical stage IA, grade 1 endometrial  carcinoma diagnosed and treated in April 2008 with no evidence of  recurrent disease.   PLAN:  1. We will follow up on the results of her Pap smear from today.  She      will see Dr. Hyacinth Meeker in 4 months return to      see [place] in 8 months.  2. She will take the responsibility of following up with her mammogram      to elucidate the axillary lymph nodes.  3. She was encouraged and congratulated on her weight loss efforts.      Paola A. Duard Brady, MD  Electronically Signed     PAG/MEDQ  D:  10/24/2007  T:  10/24/2007  Job:  [id number]   cc:   M. Leda Quail, MD  Fax: [phone]   Telford Nab, R.N.  [address]  [place], [place] [address]   Bevelyn Buckles. Bensimhon, MD  [address], [place] [address]   Lorne Skeens. Hoxworth, M.D.  [address], [address]  [place]  [place] [address]   Tera Mater. Evlyn Kanner, M.D.  Fax: [phone]

## 2010-08-18 NOTE — Consult Note (Signed)
NAMEJADIA, [name]                ACCOUNT NO.:  [id number]   MEDICAL RECORD NO.:  [id number]          PATIENT TYPE:  OUT   LOCATION:  GYN                          FACILITY:  [facility]   PHYSICIAN:  Paola A. Duard Brady, MD    DATE OF BIRTH:  1950/08/13   DATE OF CONSULTATION:  02/22/2007  DATE OF DISCHARGE:                                 CONSULTATION   [name] is a 60 year old with history of stage IA, grade 1  endometrial carcinoma who underwent exploratory laparotomy, TAH-BSO, and  partial ventral hernia repair July 05, 2006.  The patient had been on  progestational therapy which she had failed.  Her pathology did reveal a  well-differentiated carcinoma with therapy-related changes, marked  decidualized endometrial stromal, consistent with exogenous progestin.  There was no invasion.  Cervix, bilateral tubes, and ovaries were  negative.  Lymph nodes were not performed, secondary to physical  limitations.  She has been doing fairly well, since that time.  She has  been following up with Dr. Johna Sheriff fairly regularly.  She does have  persistent hernias, but attempts at repair will be deferred until she  undergoes hopefully lap band surgery and significant weight loss.  From  a gynecologic oncology standpoint, she really has no complaint.  She  denies any bleeding, any significant change in bowel or bladder habits.  She has been found to be anemic.  She has been having issues taking iron  in terms of bowel-related complications from iron therapy, so they may  be considering some IV iron for her.  She follows up with Dr. Evlyn Zavala  fairly regularly.  She is occasionally lightheaded but attributes that  more to her blood sugars.  She does have some poor exercise tolerance,  and it is difficult for her to ascertain whether it is due to her  obesity or her anemia.   PHYSICAL EXAMINATION:  VITAL SIGNS:  Weight 374 pounds, which is up 6  pounds from her last visit.  GENERAL:  Well-nourished,  well-developed female in no acute distress.  NECK:  Supple.  There is no lymphadenopathy, no thyromegaly.  LUNGS:  Clear to auscultation with distant breath sounds.  CARDIOVASCULAR:  Regular rate and rhythm with distant heart sounds.  ABDOMEN:  She has a vertical midline incision with some flaking skin and  white lesions on it that is difficult to ascertain, if it is scaly skin  versus suture material.  There are palpable hernias.  There is a large  overhanging pannus.  Groins are negative for adenopathy but exam is  somewhat limited.  EXTREMITIES:  She has 2+ pitting edema equal bilaterally.  PELVIC:  External genitalia is within normal limits.  The vagina is  markedly atrophic.  The vaginal cuff is seen with some difficulty, due  to the redundant vaginal tissue.  There is no gross visible lesions.  Thin prep pap was somewhat without difficulty.  Bimanual examination  reveals no masses or nodularity.  However, it is difficult for the  examiner's hand to reach the top of the vaginal cuff.  RECTOVAGINAL:  Exam is somewhat  limited, but to the extent of the examining fingers,  there is no masses or nodularity.   ASSESSMENT:  A 60 year old with stage IA, grade 1 endometrioid  adenocarcinoma, with no evidence of recurrent disease.   PLAN:  1. We will get her back on a routine follow-up schedule.  She will see      Dr. Leda Zavala in 4 months and return      to see [place] in 8 months.  She will call Dr. Hyacinth Meeker and schedule that      appointment.  2. She will keep up-to-date with her routine mammography.  3. We wish her the best of luck with regards to her potential lap band      surgery and weight loss efforts.      Paola A. Duard Brady, MD  Electronically Signed     PAG/MEDQ  D:  02/22/2007  T:  02/22/2007  Job:  [id number]   cc:   [name], R.N.  [address]  [place], [place] [address]   Victoria Buckles. Bensimhon, MD  [address], [place] [address]   Victoria Skeens.  Zavala, VictoriaD.  [address]  [place]  [place] [address]   [name], VictoriaD.  Fax: [phone]   M. Leda Quail, MD  Fax: [phone]

## 2010-08-18 NOTE — Consult Note (Signed)
NAMESTAPHANIE, [name]                ACCOUNT NO.:  [id number]   MEDICAL RECORD NO.:  [id number]          PATIENT TYPE:  OUT   LOCATION:  GYN                          FACILITY:  [facility]   PHYSICIAN:  Paola A. Duard Brady, MD    DATE OF BIRTH:  1951-01-22   DATE OF CONSULTATION:  10/16/2008  DATE OF DISCHARGE:                                 CONSULTATION   HISTORY:  The patient is a 60 year old with history of 1A grade 1  endometrial carcinoma who underwent exploratory laparotomy, TAH-BSO and  removal of ventral hernia with partial repair in August 2008 with myself  and Dr. Johna Sheriff.  I most recently saw her in December 2009 at which  time her exam was unremarkable and her Pap smear was negative.  She  comes in today for followup.  She is overall doing fairly well.  She is  complaining of some vulvar itching in the more superior aspect.  She has  scratched herself at night to the point of seeing some blood.  She does  not believe that there has been any vaginal bleeding.  She denies any  change in her bowel or bladder habits.  She does continue to have pain  in her right breast.  She most recently had a mammogram at the 68-month  interval which was a digital diagnostic in February 2010 that was  negative by RADS category 1.  She states her weight has been holding  steady.  She is going to be starting a liquid diet in an attempt to lose  50 pounds to see if she could ultimately get her hernia repair as well  as consideration for Roux-en-Y under the care of Dr. Johna Sheriff.   MEDICATIONS:  List is reviewed and is unchanged.   PHYSICAL EXAMINATION:  VITAL SIGNS:  Blood pressure 140/70, pulse 80.  GENERAL:  Well-nourished, well-developed female in no acute distress.  NECK:  Supple.  There is no lymphadenopathy, no thyromegaly.  LUNGS:  Distant heart sounds, but otherwise clear.  CARDIOVASCULAR:  Regular rate and rhythm.  ABDOMEN:  Morbidly obese with a midline incision.  There are some  scabbed areas  over the midline incision where the skin has broken down.  There are no masses.  However, exam is significantly limited by the  patient's habitus as well as the large hernias.  The pannus is clean.  There is no Candida in the intertriginous zones.  Groins are negative  for adenopathy, though exam is again limited.  EXTREMITIES:  She has 1+ pitting edema equal bilaterally with some  chronic  venostasis changes.  PELVIC:  External genitalia has some erythema around the labia minora  consistent with a monilial infection.  The vagina is without lesions or  discharge.  The vaginal cuff is visualized.  There are no visible  lesions.  ThinPrep Pap smear was submitted without difficulty.  Bimanual  examination is limited by habitus.  However, there are no masses or  nodularity.  Rectal confirms.   ASSESSMENT:  A 60 year old with stage 1A grade 1 endometrioid  adenocarcinoma diagnosed and treated almost 2 years  ago who clinically  has no evidence of recurrent disease.   PLAN:  We will follow up the results of her Pap smear from today.  She  will return to see [place] in 6 months.  She will continue to follow up with  Dr. Johna Sheriff per her weight loss.      Paola A. Duard Brady, MD  Electronically Signed     PAG/MEDQ  D:  10/16/2008  T:  10/16/2008  Job:  [id number]   cc:   M. Leda Quail, MD  Fax: [phone]   Telford Nab, R.N.  [address]  [place], [place] [address]   Bevelyn Buckles. Bensimhon, MD  [address], [place] [address]   Lorne Skeens. Hoxworth, M.D.  [address], [address]  [place]  [place] [address]   Tera Mater. Evlyn Kanner, M.D.  Fax: [phone]

## 2010-08-18 NOTE — Op Note (Signed)
[name], [name]                ACCOUNT NO.:  [id number]   MEDICAL RECORD NO.:  [id number]          PATIENT TYPE:  OIB   LOCATION:  1225                         FACILITY:  [facility]   PHYSICIAN:  Sharlet Salina T. Hoxworth, M.D.DATE OF BIRTH:  Feb 14, 1951   DATE OF PROCEDURE:  04/04/2007  DATE OF DISCHARGE:                               OPERATIVE REPORT   PREOPERATIVE DIAGNOSES:  Morbid obesity.   POSTOPERATIVE DIAGNOSES:  Morbid obesity.   SURGICAL PROCEDURE:  Placement of laparoscopic adjustable gastric band.   SURGEON:  Dr. Johna Sheriff   ASSISTANT:  Dr. Luretha Murphy.   ANESTHESIA:  General.   BRIEF HISTORY:  [name] is a 60 year old female with a long history  of morbid obesity unresponsive to medical management and multiple  comorbidities including hypertension, diabetes and sleep apnea.  After  an extensive discussion and workup preoperatively detailed elsewhere, we  have elected to proceed with placement of laparoscopic adjustable  gastric band.   DESCRIPTION OF OPERATION:  The patient was brought to the operating  room, placed in supine position on the operating table and general  endotracheal anesthesia was induced.  She had received Lovenox  subcutaneously and preoperative IV antibiotics.  PAS were placed.  The  abdomen was widely sterilely prepped and draped.  The correct patient  and procedure were verified.  Abdominal access was obtained in the left  subcostal space mid clavicular line with an OptiVu trocar and  pneumoperitoneum established. The patient had had extensive previous  abdominal surgery mostly in the mid to lower abdomen.  The left upper  quadrant was free of adhesions.  There were adhesions of the stomach and  omentum up around the falciform ligament.  Under direct vision, a 5-mm  trocar was placed in the left flank.  Through this using the harmonic  scalpel, I then took down completely the falciform ligament and  adhesions to the anterior abdominal wall  in the upper abdomen creating  plenty of room for trocar placement.  The liver was mildly enlarged with  some fatty infiltration but not markedly so.  Following this under  direct vision, an 11-mm trocar was placed above and just to the left the  umbilicus for the camera port.  A 12-mm trocar placed in the right  subcostal position and an 11-mm trocar in the right upper mid abdomen.  Through a 5-mm site the patient in steep reverse Trendelenburg, the  [facility] liver retractor was replaced and the left lobe of the liver  elevated with good exposure of the hiatus and upper stomach.  The angle  of hiss was exposed and the peritoneum incised over the left crus and  dissection carried back down along the left crus toward the retrogastric  space bluntly with the finger dissector.  Following this, the pars  flaccid was opened and an avascular place.  This was a somewhat thick  fatty structure.  It was divided with  the harmonic scalpel up toward  the hiatus and then the right crus partially obscured by fat was  identified and the anterior border traced down to the area of  crossing  fat for the point of dissection.  The peritoneum here was incised with  the harmonic scalpel.  As the patient had had a small sliding hiatal  hernia seen on her upper GI series., we then placed the sizing catheter  into the stomach and the balloon was inflated to 15 mL and then pulled  back snugly against the hiatus and there was not seen to be any  significant hernia.  The sizing tube was then pulled back into the  esophagus.  The finger dissector was then passed into the dissected area  anterior to the right crus, passed without difficulty up to the angle of  hiss and then deployed up through here with no difficulty.  There was a  lot of fat again around the EG junction and pars flaccid and I elected  to use an AP large band.  The flushed band was then inserted into the  abdomen and the tubing placed in the finger  dissector which was then  brought back around retrogastric dissection and the band brought back  around posteriorly without difficulty.  The sizing tube was reintroduced  in the stomach.  The tubing was threaded and then the band clipped into  place without difficulty.  The sizing tube was removed. The fundus  beginning up near the angle of hiss was then imbricated up over the band  to the small gastric pouch with interrupted 2-0 Ethibond sutures.  The  band lay in a nice position.  Complete hemostasis was assured.  The  liver retractor was removed and the tubing brought out through the right  mid abdominal trocar site and then all CO2 evacuated and trocars  removed.  This wound to the right mid abdomen was enlarged slightly for  port placement.  At this point, I noted that the incision was directly  over and area of incisional hernia in the right upper quadrant and I did  not want to place the port here.  I therefore dissected out some intact  anterior fascia below the incision in more inferior pocket and four 2-0  Prolene stay sutures were placed in the fascia.  The tubing was trimmed  and attached to the port and then the port sutured to the anterior  fascia with the previously placed sutures.  It appeared to be well  anchored and below the area of incisional hernia.  This placed the port  about 2 cm below the lateral end of the incision and the tubing is  directed superiorly toward the incision so port should be approached  from its inferior aspect. Following this, port site subcu was closed  with running 2-0 Vicryl and skin incisions were closed with running  subcuticular 4-0 Monocryl and Dermabond. Sponge, needle and instrument  counts correct.  The patient was taken to recovery in good condition.      Lorne Skeens. Hoxworth, M.D.  Electronically Signed     BTH/MEDQ  D:  04/04/2007  T:  04/05/2007  Job:  [id number]   cc:   Tera Mater. Evlyn Kanner, M.D.  Fax: [phone]

## 2010-08-18 NOTE — Consult Note (Signed)
NAMEJOLIN, [name]                ACCOUNT NO.:  [id number]   MEDICAL RECORD NO.:  [id number]          PATIENT TYPE:  OUT   LOCATION:  GYN                          FACILITY:  [facility]   PHYSICIAN:  Paola A. Duard Brady, MD    DATE OF BIRTH:  Jan 23, 1951   DATE OF CONSULTATION:  08/30/2006  DATE OF DISCHARGE:                                 CONSULTATION   [name] is a 60 year old with multiple medical problems who  underwent a D&C in February 2008 that revealed grade 1 endometrioid  adenocarcinoma.  After extensive discussion with [place] as well as Dr.  Johna Sheriff, her primary general surgeon, we opted for exploratory  laparotomy, TAH-BSO and partial ventral hernia repair by Dr. Johna Sheriff.  Final pathology was consistent with a stage I-A. grade 1 endometrioid  adenocarcinoma.  She had a well-differentiated endometrioid  adenocarcinoma with therapy-related changes.  She had decidualized  endometrial stroma consistent with exogenous progestin.  There is no  invasion noted.  She had extensive adenomyosis with no cancer.  Cervix,  bilateral tubes and ovaries were negative, as were her washings.  She  comes in today for a postoperative check.  She has been seen by Dr.  Johna Sheriff several times for management of a small superficial wound  separation.  She is due to see him back in July for discussion regarding  lap band surgery.  She is overall doing quite well.  Since she no longer  has any malignancy, per her primary care physician she could stop her  Coumadin, which she is very happy with.  She otherwise denies any  significant complaints.  She denies any pain.  She has had no vaginal  bleeding.   PHYSICAL EXAMINATION:  VITAL SIGNS:  Weight 368 pounds.  GENERAL:  Well-nourished, alert female in no acute distress.  ABDOMEN:  She has a very well-healing vertical midline incision.  She  does have a large palpable hernia, which is reducible and nontender.  She has been wearing an abdominal binder.  PELVIC:  External genitalia is within normal limits.  The vagina is  atrophic.  The vaginal cuff is visualized.  It is well-healed.  Bimanual  examination reveals no masses.  No nodularity.  Exam is limited by  habitus.   ASSESSMENT:  A 60 year old with stage I-A, grade 1 endometrioid  adenocarcinoma, who is doing well from a postoperative standpoint.   PLAN:  She will continue her follow-up with Dr. Johna Sheriff as previously  scheduled as well as with her other primary physicians, of Dr. Evlyn Kanner and  Dr. Gala Romney.  She return to see me in 6 months for cancer  surveillance.  She is very pleased with her pathology report.      Paola A. Duard Brady, MD  Electronically Signed     PAG/MEDQ  D:  08/30/2006  T:  08/30/2006  Job:  [id number]   cc:   Telford Nab, R.N.  [address]  [place], [place] [address]   Bevelyn Buckles. Bensimhon, MD  [address], [place] [address]   Lorne Skeens. Hoxworth, M.D.  [address]  [place]  [place] [address]   Tera Mater. Evlyn Kanner, M.D.  Fax: [phone]   M. Leda Quail, MD  Fax: [phone]

## 2010-08-18 NOTE — Patient Instructions (Addendum)
-   Appt with Dr. Cheryle Horsfall for evaluation.   - Insurance questions:  (a) lap band review and (b) [employer] coverage?  - [employer] late-afternoon snacking:  Substitute a protein shake for other snacks, to be consumed no later than 4:30 PM.   - Write in your journal your successes ONLY.   - Follow-up appt is at 8:30 on June 5 (not 9 AM).

## 2010-08-18 NOTE — Consult Note (Signed)
[name], [name]                ACCOUNT NO.:  [id number]   MEDICAL RECORD NO.:  [id number]          PATIENT TYPE:  OUT   LOCATION:  GYN                          FACILITY:  [facility]   PHYSICIAN:  Paola A. Duard Brady, MD    DATE OF BIRTH:  [birth year]   DATE OF CONSULTATION:  03/26/2008  DATE OF DISCHARGE:                                 CONSULTATION   The patient is a very pleasant 60 year old with history of a stage IA,  grade 1 endometrial carcinoma who underwent exploratory laparotomy,  total abdominal hysterectomy/bilateral salpingo-oophorectomy, partial  ventral hernia in August 2008.  She had been on progestational agent  that failed.  I most recently saw her in July 2009, at which time her  exam was unremarkable, and her Pap smear was negative.  She comes in  today for followup.  At that time, her weight was down to 338 pounds  which is down from 374 pounds.  She states that her lap band was  released somewhat and is not as tight as it was.  As a result of that,  she has gained weight and is up to 352 pounds.  She is overall doing  quite well.  She has not seen Dr. Hyacinth Meeker since we last saw her.  She  states she will see her in the next 6 months.  She has had recurrent  mammograms, most recently having had one in August 2005 which revealed  some benign findings on bilateral breast ultrasound, and they  recommended followup mammography in January 2010; she has an appointment  for April 10, 2008.   REVIEW OF SYSTEMS:  She denies any bleeding or any chest pain, shortness  of breath, nausea, vomiting, fevers, chills, headaches, change in bowel  or bladder habits.   PHYSICAL EXAMINATION:  VITAL SIGNS:  Weight 352 pounds which is up from  338 pounds, blood pressure 144/82.  GENERAL:  Well-nourished, well-developed female in no acute distress.  NECK:  Supple.  There is no lymphadenopathy, no thyromegaly.  LUNGS:  Clear to auscultation with distant breath sounds.  CARDIOVASCULAR:   Regular rate and rhythm.  ABDOMEN:  Morbidly obese.  There is a large midline hernia.  There are  no palpable masses.  There is no hepatosplenomegaly, although exam is  limited by habitus.  Groins are negative for adenopathy.  EXTREMITIES:  She has 1-2+ nonpitting edema equal bilaterally.  PELVIC:  External genitalia are within normal limits.  The vagina is  slightly atrophic.  The vaginal cuff is visualized.  There are no  visible lesions.  Exam is somewhat limited by habitus.  ThinPrep Pap was  submitted without difficulty.  Bimanual examination again was limited,  but there are no obvious masses or nodularity.  Rectal confirms.   ASSESSMENT:  A 60 year old with stage IA, grade 1 endometrial  adenocarcinoma who is 1 year out from her diagnosis with no evidence of  recurrent disease.   PLAN:  1. Will follow up on results of her Pap smear from today.  She will      return to see [place] in  1 year.  In the interim, she      will see Dr. Hyacinth Meeker in 6 months.  2. She will keep her mammogram appointment.  3. She has an appointment to see Dr. Johna Sheriff tomorrow to discuss her      gastric band.      Paola A. Duard Brady, MD  Electronically Signed     PAG/MEDQ  D:  03/26/2008  T:  03/26/2008  Job:  [id number]   cc:   M. Leda Quail, MD  Fax: [phone]   Telford Nab, R.N.  [address]  [place], [place] [address]   Bevelyn Buckles. Bensimhon, MD  [address], [place] [address]   Lorne Skeens. Hoxworth, M.D.  [address], [address]  [place]  [place] [address]   Tera Mater. Evlyn Kanner, M.D.  Fax: [phone]

## 2010-08-21 NOTE — Assessment & Plan Note (Signed)
[facility]                            CARDIOLOGY OFFICE NOTE   [name], [name]                       MRN:          [id number]  DATE:05/23/2006                            DOB:          10-05-1950    Primary care physician:  Tera Mater. Evlyn Kanner, M.D.   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  I  recently saw her in consultation about 2 weeks ago for chronic  exertional dyspnea, which was somewhat progressive.  Since that time she  has undergone a 2 D echocardiogram which showed an EF of 60% with no  significant valvular disease.  There was mild LVH.  There was no comment  about the diastolic dysfunction.  She also had an adenosine Myoview  which showed an EF of 70% with no ischemia or infarct.  She continues to  have some mild chronic shortness of breath but no chest pain.  She feels  that this is mostly her weight and is quite relieved with the results of  her testing.  She is pending a D&C for endometrial carcinoma later this  week.  We have cleared her for that.   CURRENT MEDICATIONS:  1. Synthroid 200 mg a day.  2. Altace 20 mg a day.  3. Crestor 10 mg a day.  4. Lantus 55 units b.i.d.  5. Protonix 40 mg a day.  6. Megace.  7. Warfarin.  8. Zinc.  9. Iron.  10.Multivitamin.  11.Clonidine patch 3 TTS.   Also a history of a right upper extremity DVT.   PHYSICAL EXAMINATION:  GENERAL:  She ambulates around the clinic without  any respiratory difficulty.  VITAL SIGNS:  Blood pressure is 194/60, which she attributes to her  nerves, and heart rate is 140.  HEENT:  Sclerae anicteric, EOMI.  There are no xanthelasmas.  Mucous  membranes are moist.  Oropharynx is clear.  NECK:  Supple with no JVD.  Carotids are 2+ without any bruits.  There  is no lymphadenopathy or thyromegaly.  CARDIAC:  She is mildly tachycardic with a 2/6 systolic ejection murmur  along the LV outflow tract.  There is no rub or gallop.  LUNGS:  Clear.  ABDOMEN:  Obese.  Nontender.  Nondistended.  I am unable to appreciate  any hepatosplenomegaly.  There are no bruits or masses.  EXTREMITIES:  Warm with no cyanosis, clubbing or edema.  No rashes.  NEUROLOGIC:  Alert and oriented x3.  Cranial nerves II-XII intact.  Moves all four extremities without difficulty.  Affect is bright.   ASSESSMENT AND PLAN:  Chronic exertional dyspnea.  I suspect this is  mostly related to her weight.  We had a talk about this and we reviewed  her studies in depth.  There is no evidence of pulmonary hypertension on  her echocardiogram as well.  At this point I have suggested that she  just proceed with a weight loss program.  Should her symptoms continue  after successful weight loss, then I think she would need further  evaluation and a possible invasive workup.  We will see her back on a  p.r.n. basis.  I wish her the best of luck with her weight loss program  and also her upcoming D&C.     Bevelyn Buckles. Bensimhon, MD  Electronically Signed    DRB/MedQ  DD: 05/23/2006  DT: 05/24/2006  Job #: [id number]   cc:   Jeannett Senior A. Evlyn Kanner, M.D.

## 2010-08-21 NOTE — H&P (Signed)
NAME:  [name], [name]                ACCOUNT NO.:  [id number]   MEDICAL RECORD NO.:  [id number]          PATIENT TYPE:  OUT   LOCATION:  VASC                         FACILITY:  [facility]   PHYSICIAN:  Paola A. Duard Brady, MD    DATE OF BIRTH:  25-Mar-1951   DATE OF ADMISSION:  06/07/2006  DATE OF DISCHARGE:  06/07/2006                              HISTORY & PHYSICAL   DATE OF SERVICE:  June 21, 2006.   DATE OF SURGERY:  Tentative date of surgery July 05, 2006.   HISTORY OF PRESENT ILLNESS:  The patient is a 60 year old morbidly obese  female with multiple other medical co-morbidities that underwent an  endometrial biopsy November 2007 that revealed grade 1 endometrioid  adenocarcinoma.  She was placed on Megace 40 mg daily to decrease her  bleeding.  She was seen by radiation oncology and was not deemed an  appropriate radiation candidate; given her exam they felt that  brachytherapy, which would be an integral component of her treatment,  would be difficult based on her clinical examination.  Therefore she was  maintained on Megace and subsequently underwent an endometrial biopsy on  May 25, 2006.  Biopsy revealed focal residual grade 1 endometrioid  adenocarcinoma associated with abundant decidualized endometrium.  The  patient wished to have definitive surgery and at this point is  tentatively scheduled for exploratory laparotomy, TAH, BSO and complex  wound closure on July 05, 2006 by our service with the assistance of Dr.  Johna Sheriff.  She has a history of chronic exertional dyspnea and has  undergone an echocardiogram which showed an ejection fraction of 60%  with no significant valvular disease.  She had an adenosine Myoview  which showed an ejection fraction of 70% with no ischemia or infarction.   This is an H&P being dictated for surgery on July 05, 2006.   MEDICATIONS:  Medication include Synthroid 200 mcg a day, Altace 20 mg a  day, Crestor 10 mg daily, Lantus 55 units  b.i.d., Protonix 40 mg a day,  Coumadin.   PHYSICAL EXAMINATION:  GENERAL APPEARANCE:  She is an obese female in no  acute distress.  ABDOMEN:  Is notable for multiple surgical incisions.  PELVIC:  External genitalia is within normal limits.  The cervix was  difficult to visualize but the anterior lip of the cervix had no gross  visible lesions.  Bimanual examination was limited by habitus.   ASSESSMENT:  Patient is a 60 year old morbidly obese female with  multiple medical problems with endometrial carcinoma.  She is not a  candidate for vaginal hysterectomy and similarly she is not a candidate  for radiation therapy secondary to her morbid obesity.  She is scheduled  for exploratory laparotomy, TAH-BSO with complex wound  closure on July 05, 2006.  Risks of surgery including bleeding,  infection, injury to surrounding organs, the risks of deep venous  thromboses have been discussed with the patient.  She wished to proceed.  Surgery will be done in conjunction with Dr. Johna Sheriff and this has been  arranged on his schedule as well.  Paola A. Duard Brady, MD  Electronically Signed     PAG/MEDQ  D:  06/21/2006  T:  06/21/2006  Job:  [id number]   cc:   Lorne Skeens. Hoxworth, M.D.  [address], [address]  [place]  [place] [address]   Telford Nab, R.N.  [phone] [address]  [place], [place] [address]   Tera Mater. Evlyn Kanner, M.D.  Fax: [phone]   Bevelyn Buckles. Bensimhon, MD  [address], [place] [address]   M. Leda Quail, MD  Fax: [phone]

## 2010-08-21 NOTE — Discharge Summary (Signed)
NAMESYNIA, DOUGLASS                ACCOUNT NO.:  [id number]   MEDICAL RECORD NO.:  [id number]          PATIENT TYPE:  INP   LOCATION:  1616                         FACILITY:  [facility]   PHYSICIAN:  Sharlet Salina T. Hoxworth, M.D.DATE OF BIRTH:  [birth year]   DATE OF ADMISSION:  07/05/2006  DATE OF DISCHARGE:  07/07/2006                               DISCHARGE SUMMARY   DISCHARGE DIAGNOSIS:  Well-differentiated endometrial carcinoma, grade  1, without invasion.   OPERATIONS AND PROCEDURES:  Total abdominal hysterectomy-bilateral  salpingo-oophorectomy performed by Rejeana Brock A. Duard Brady, MD, with abdominal  access and management, Dr. Johna Sheriff.   HISTORY OF PRESENT ILLNESS:  [name] is a 60 year old female with  morbid obesity and multiple comorbidities, who on D&C has been found to  have a grade 1 endometrial adenocarcinoma.  She has not responded to  Megace, and TAH-BSO has been recommended and accepted.  She has a  history of multiple previous ventral hernias with recurrence and mesh in  her abdomen and morbid obesity.  Abdominal axis is a concern.  She was  admitted to my service for her hysterectomy with Dr. Duard Brady and we have  been asked to manage abdominal access and wound issues.   PAST MEDICAL HISTORY:  1. Morbid obesity.  2. Hyperthyroidism.  3. GERD.  4. Hypercholesterolemia.  5. Sleep apnea.  6. Adult-onset diabetes mellitus.   PAST SURGERIES:  1. C-section.  2. Ventral hernia repair with mesh in 1999.  3. Recurrent hernia repair in 2021m Dr. Abbey Chatters.  4. Laparotomy in May 2007 for incarcerated hernia with small bowel      obstruction, by me, with mesh placement and subsequent wound      infection, recurrent hernia.  5. Has a history of DVT in her right arm.   MEDICATIONS:  Synthroid, Altace, Colace, Protonix, vitamin K, Coumadin  held for surgery, and NovoLog, Crestor.   ALLERGIES:  AMPICILLIN, CODEINE, DAYPRO,   PHYSICAL EXAMINATION:  VITAL SIGNS:  She is  afebrile.  Vital signs  within normal limits.  GENERAL:  Morbidly obese.  HEENT: Negative.  CHEST:  Clear.  CARDIAC:  Regular rate and rhythm.  ABDOMEN:  Obese.  Large midline incision extending to just below the  umbilicus.  Large pannus and upper abdominal and midabdominal hernia.   HOSPITAL COURSE:  The patient was admitted on the morning of her  procedure and through a low midline incision underwent a TAH-BSO.  Access was not unusually difficult.  The incision was closed using a  piece of absorbable Surgisis mesh intraperitoneally to reinforce the  upper part of the wound, which was essentially the lower part of a large  upper abdominal hernia, which was not addressed at this time.  Her  postoperative course was quite smooth.  She was up and ambulatory on the  day of surgery.  Tolerated diet, which was advanced.  Her wound is  healing primarily without complication.  Lajoyce Corners is in place.  She is  discharged home on July 07, 2006.  Follow-up is to be in my office in 1  week.      Sharlet Salina  Lurlean Leyden, M.D.  Electronically Signed     BTH/MEDQ  D:  08/01/2006  T:  08/01/2006  Job:  [id number]   cc:   Bevelyn Buckles. Bensimhon, MD  [address] [place] [address]   M. Leda Quail, MD  Fax: [phone]   Rejeana Brock A. Duard Brady, MD  [address]  [place], [place] [address]   Tera Mater. Evlyn Kanner, M.D.  Fax: [phone]

## 2010-09-08 ENCOUNTER — Ambulatory Visit (INDEPENDENT_AMBULATORY_CARE_PROVIDER_SITE_OTHER): Payer: Commercial Managed Care - PPO | Admitting: Family Medicine

## 2010-09-08 DIAGNOSIS — E119 Type 2 diabetes mellitus without complications: Secondary | ICD-10-CM

## 2010-09-08 NOTE — Patient Instructions (Addendum)
-   Success Journal!  Consider this a week-long experiment.  Review daily.  Write about how you feel as you look over your successes.   - Food goals:    1. Plan grocery list for the week, and shop.   2. At least 3 home-cooked meals per week.   3. Fruit snacks at least once daily.  - Continue walking as you've been doing.   - Call insurance company:  Lap band repair, etc.  Also ask re. residential treatment [employer]).   - Scientist, research (physical sciences) re. insurance company.

## 2010-09-08 NOTE — Progress Notes (Signed)
Medical Nutrition Therapy: Appt start time: 1600 end time: 1700.   Assessment: Primary concerns today: Blood sugar control.  Daney said she didn't write down any of her successes b/c she didn't feel that avoiding ice cream on some days or better BG levels were significant enough to write down.  As we talked, it became clear tht there were several choices she made that represented significant victories, i.e., cleaning up after her aunt's b'day party, and not bringing home any of the desserts.  24-hr recall suggests intake of ~1900 kcal: B (7 AM)- protein shake (EAS Advantage; 17 g pro, 1.5 g CHO), 2 c coffee w/ 3 creams & 3-4 Splenda/c; L (12 PM)- potato bacon soup, 8 crackers, Jell-O; Snk (6 PM)- 1 c coffee w/ 3 creams & 3-4 Splenda/c, Klondike Bar; D (7:30 PM)- lasagna, 1 bread w/ olive oil, 24 oz unswt tea, 1 c ice cream.  Yesterday was [place]'s b'day.  Her son and husband gave her Klondike bars for her b'day, and although she'd not planned on dessert at dinner, the waitress brought a complimentary dish of ice cream.  We talked of the importance of Madisin's gaining some confidence that she can make these changes, and I pointed out to her that she has lost ~15 lb since Dec, which she was unaware of.    Nutrition Diagnosis:  Stable progress on physical inactivity (NB-2.1) related to poor fitness and SOB as evidenced by continued frequent walking at work.  No change in inappropriate intake of types of carbohydrate (NI-53.3) related to sweets as evidenced by near-daily intake of sweets.   Intervention: Nutrition counseling.   Monitoring/Evaluation: Dietary intake in 4 weeks.

## 2010-09-29 ENCOUNTER — Ambulatory Visit (INDEPENDENT_AMBULATORY_CARE_PROVIDER_SITE_OTHER): Payer: Commercial Managed Care - PPO | Admitting: Family Medicine

## 2010-09-29 DIAGNOSIS — E119 Type 2 diabetes mellitus without complications: Secondary | ICD-10-CM

## 2010-09-29 NOTE — Progress Notes (Signed)
Medical Nutrition Therapy: Appt start time: 0830 end time: 0915.   Assessment: Primary concerns today: Blood sugar control.  [name] again did not write down any "successes," saying she didn't feel she had any successes worth noting.  Upon talking with her, it was apparent, however, that she has made some good choices, i.e.,  she has rarely stopped for ice cream on the way home from work; lunch has usually been from the cafeteria; soup & veg or entree or just veg's.  [name] has been on her feet a lot more b/c of changes in work responsibilities, usually working 8 AM to 6 PM.  Dinner is often 8 or 8:30 now b/c she naps when getting home from work, and she has frequently eaten just [place] w/ 1% milk rather than cooking.  She is usually getting 2-3 protein drinks (17 g protein) per day.  FBG have been low-100s (up to 130).     Nutrition Diagnosis:  Stable progress on physical inactivity (NB-2.1) related to poor fitness and SOB as evidenced by continued frequent walking at work.  Progress noted in inappropriate intake of types of carbohydrate (NI-53.3) related to sweets as evidenced by sweets consumed 3-4 X wk.   Intervention: Nutrition counseling.   Monitoring/Evaluation: Dietary intake in late July.

## 2010-09-29 NOTE — Patient Instructions (Signed)
-   Success journal:  Practice this as you would brainstorming; no judgment.   - Food goal:  Significant protein source at lunch; bring a protein shake as back-up if no good food options.   - Give some thought to how you can navigate the lounge without falling prey to sweets in there.   - Develop this vision of going thru the lounge without eating sweets, and WRITE about it.   - Call [employer] to ask if they have had success with getting reimbursed from [facility].

## 2010-10-09 ENCOUNTER — Ambulatory Visit (INDEPENDENT_AMBULATORY_CARE_PROVIDER_SITE_OTHER): Payer: Self-pay | Admitting: Surgery

## 2010-10-30 ENCOUNTER — Encounter (INDEPENDENT_AMBULATORY_CARE_PROVIDER_SITE_OTHER): Payer: Self-pay

## 2010-10-30 ENCOUNTER — Ambulatory Visit (INDEPENDENT_AMBULATORY_CARE_PROVIDER_SITE_OTHER): Payer: Commercial Managed Care - PPO | Admitting: Physician Assistant

## 2010-10-30 VITALS — BP 140/80 | Ht 68.0 in | Wt 365.8 lb

## 2010-10-30 DIAGNOSIS — Z4651 Encounter for fitting and adjustment of gastric lap band: Secondary | ICD-10-CM

## 2010-10-30 NOTE — Patient Instructions (Signed)
Follow-up as needed. Return if you have persistent over-restrictive symptoms.

## 2010-10-30 NOTE — Progress Notes (Signed)
  HISTORY: [name] is a 60 y.o.female who received an AP-Large lap-band in December 2008 by Dr. Johna Sheriff. She was last seen in September 2011 and since then has been having persistent issues with swallowing solid foods. She continues to rely on soups and ice cream as a mainstay of her diet and is frustrated with not being able to eat other foods. She voices overall disappointment with her weight loss with the band and wants fluid removed so that she can stop reaching for sweet foods.  VITAL SIGNS: Filed Vitals:   10/30/10 1111  BP: 140/80    PHYSICAL EXAM: Physical exam reveals a very well-appearing 60 y.o.female in no apparent distress Neurologic: Awake, alert, oriented Psych: Bright affect, conversant Respiratory: Breathing even and unlabored. No stridor or wheezing Abdomen: Soft, nontender, nondistended to palpation. Incisions well-healed. No incisional hernias. Port easily palpated. Extremities: Atraumatic, good range of motion.  ASSESMENT: 60 y.o.  female  s/p AP-Large lap-band. We will remove some fluid today.  PLAN: The port was accessed with ease and 2.0 mL of clear fluid was removed leaving 7 mL total. She will return PRN>

## 2010-11-02 ENCOUNTER — Ambulatory Visit: Payer: Commercial Managed Care - PPO | Admitting: Family Medicine

## 2010-12-03 ENCOUNTER — Ambulatory Visit (INDEPENDENT_AMBULATORY_CARE_PROVIDER_SITE_OTHER): Payer: Commercial Managed Care - PPO | Admitting: General Surgery

## 2011-01-08 LAB — DIFFERENTIAL
Basophils Absolute: 0
Basophils Absolute: 0
Basophils Relative: 0
Basophils Relative: 0
Eosinophils Absolute: 0.1
Eosinophils Absolute: 0.2
Eosinophils Relative: 1
Eosinophils Relative: 3
Lymphocytes Relative: 17
Lymphocytes Relative: 20
Lymphs Abs: 1.7
Lymphs Abs: 2.2
Monocytes Absolute: 0.5
Monocytes Absolute: 0.8
Monocytes Relative: 6
Monocytes Relative: 6
Neutro Abs: 6.1
Neutro Abs: 9.9 — ABNORMAL HIGH
Neutrophils Relative %: 72
Neutrophils Relative %: 76

## 2011-01-08 LAB — BASIC METABOLIC PANEL
BUN: 16
CO2: 29
Calcium: 9.1
Chloride: 103
Creatinine, Ser: 0.81
GFR calc Af Amer: >60
GFR calc non Af Amer: >60
Glucose, Bld: 124 — ABNORMAL HIGH
Potassium: 4.1
Sodium: 140

## 2011-01-08 LAB — CBC
HCT: 34.9 — ABNORMAL LOW
HCT: 35.1 — ABNORMAL LOW
Hemoglobin: 11.1 — ABNORMAL LOW
Hemoglobin: 11.1 — ABNORMAL LOW
MCHC: 31.6
MCHC: 31.9
MCV: 64.6 — ABNORMAL LOW
MCV: 65.3 — ABNORMAL LOW
Platelets: 304
Platelets: 350
RBC: 5.38 — ABNORMAL HIGH
RBC: 5.39 — ABNORMAL HIGH
RDW: 19.8 — ABNORMAL HIGH
RDW: 19.9 — ABNORMAL HIGH
WBC: 13 — ABNORMAL HIGH
WBC: 8.5

## 2011-01-08 LAB — URINALYSIS, ROUTINE W REFLEX MICROSCOPIC
Bilirubin Urine: NEGATIVE
Glucose, UA: NEGATIVE
Hgb urine dipstick: NEGATIVE
Ketones, ur: NEGATIVE
Nitrite: NEGATIVE
Protein, ur: NEGATIVE
Specific Gravity, Urine: 1.009
Urobilinogen, UA: 0.2
pH: 6

## 2011-01-14 ENCOUNTER — Ambulatory Visit (INDEPENDENT_AMBULATORY_CARE_PROVIDER_SITE_OTHER): Payer: Commercial Managed Care - PPO | Admitting: General Surgery

## 2011-01-14 ENCOUNTER — Encounter (INDEPENDENT_AMBULATORY_CARE_PROVIDER_SITE_OTHER): Payer: Self-pay | Admitting: General Surgery

## 2011-01-14 DIAGNOSIS — K469 Unspecified abdominal hernia without obstruction or gangrene: Secondary | ICD-10-CM

## 2011-01-14 DIAGNOSIS — K432 Incisional hernia without obstruction or gangrene: Secondary | ICD-10-CM

## 2011-01-14 NOTE — Progress Notes (Signed)
History: Patient returns to the office for followup of her morbid obesity and lap band as well as her large recurrent ventral incisional hernia. She is status post lap band placement April 04, 2007. She unfortunately has had essentially no weight loss despite regular followup and adjustments. She has been unable to avoid sweet liquids. When her band gets tight enough to result in significant restriction she tends to fall back to ice cream. She had 2 cc removed in July to try to lower teeth solid food and she was unable to tolerate anything but liquids and now she is eating large amounts of solid food and has gained about 13 pounds from 365 the current 378.  She feels that her large ventral hernia has gotten gradually a little bit bigger but she has not had any pain or significant GI symptoms. She has a history of ventral hernia repair with mesh in 1999, repair of a recurrent hernia with mesh in 2003 by Dr. Abbey Chatters, emergency laparotomy for incarcerated ventral hernia with small bowel obstruction by me in 2007 at which time she had replacement of her mesh but subsequent wound infection and recurrent hernia. She had a prolonged open wound which eventually healed appear  Physical exam General: Morbidly obese female in no acute distress Abdomen: Burt exam is limited to the abdomen. There is a massive ventral hernia encompassing essentially the entire abdomen. She had a small amount of granulation with a small amount of exposed mesh in the midline which I debrided. Her abdomen is soft and nontender.  Assessment and plan: #1 morbid obesity status post lap band with no weight loss. She like to continue to work with this as much as possible. She has minimal restriction at this point and we added one cc to her band to try to get her restricted but able to eat solid foods. #2 massive multiply recurrent ventral incisional hernia. I believe that her weight elective repair is very likely to result in complications  and very unlikely to have a long-term successful result. We briefly discussed the possibility of converting her to sleeve gastrectomy. With her multiple severe small bowel adhesions I do not believe a gastric bypass is a reasonable choice. We discussed a second opinion and I'm going to ask for opinion from Dr. Marylene Land to see if he has any other thoughts. She return in 3 months.

## 2011-01-14 NOTE — Patient Instructions (Signed)
Dr. Cresenciano Genre office and should call you for appointment.

## 2011-03-02 ENCOUNTER — Other Ambulatory Visit (HOSPITAL_COMMUNITY): Payer: Self-pay | Admitting: Surgery

## 2011-03-03 ENCOUNTER — Other Ambulatory Visit (HOSPITAL_COMMUNITY): Payer: Self-pay | Admitting: Surgery

## 2011-03-03 DIAGNOSIS — K439 Ventral hernia without obstruction or gangrene: Secondary | ICD-10-CM

## 2011-03-05 ENCOUNTER — Ambulatory Visit (HOSPITAL_COMMUNITY)
Admission: RE | Admit: 2011-03-05 | Discharge: 2011-03-05 | Disposition: A | Discharge status: Home / Self Care | Payer: 59 | Source: Ambulatory Visit | Attending: Surgery | Admitting: Surgery

## 2011-03-05 DIAGNOSIS — N200 Calculus of kidney: Secondary | ICD-10-CM | POA: Insufficient documentation

## 2011-03-05 DIAGNOSIS — R109 Unspecified abdominal pain: Secondary | ICD-10-CM | POA: Insufficient documentation

## 2011-03-05 DIAGNOSIS — K439 Ventral hernia without obstruction or gangrene: Secondary | ICD-10-CM | POA: Insufficient documentation

## 2011-03-05 DIAGNOSIS — Q619 Cystic kidney disease, unspecified: Secondary | ICD-10-CM | POA: Insufficient documentation

## 2011-03-18 ENCOUNTER — Encounter (INDEPENDENT_AMBULATORY_CARE_PROVIDER_SITE_OTHER): Payer: Self-pay | Admitting: General Surgery

## 2011-03-18 ENCOUNTER — Encounter: Payer: Self-pay | Admitting: Internal Medicine

## 2011-03-19 ENCOUNTER — Ambulatory Visit: Payer: Self-pay | Admitting: Internal Medicine

## 2011-04-27 ENCOUNTER — Ambulatory Visit: Payer: Self-pay | Admitting: Internal Medicine

## 2011-04-29 ENCOUNTER — Ambulatory Visit: Payer: Self-pay | Admitting: Internal Medicine

## 2011-05-13 ENCOUNTER — Telehealth: Payer: Self-pay | Admitting: Internal Medicine

## 2011-05-13 DIAGNOSIS — Z1211 Encounter for screening for malignant neoplasm of colon: Secondary | ICD-10-CM

## 2011-05-13 NOTE — Telephone Encounter (Signed)
Left a message for patient to call me. 

## 2011-05-13 NOTE — Telephone Encounter (Signed)
Spoke with patient (cell- [phone]) and she is meeting with a surgeon tomorrow about having gastric sleeve/hernia repair. She has been told she needs a screening colonoscopy. Her recall is due 04/2012. She wants to know if she can have the colonoscopy early. Please, advise.

## 2011-05-13 NOTE — Telephone Encounter (Signed)
Yes we can schedule colonoscopy as per surgeon's request.

## 2011-05-14 ENCOUNTER — Ambulatory Visit (INDEPENDENT_AMBULATORY_CARE_PROVIDER_SITE_OTHER): Payer: Self-pay | Admitting: General Surgery

## 2011-05-14 ENCOUNTER — Encounter (INDEPENDENT_AMBULATORY_CARE_PROVIDER_SITE_OTHER): Payer: Self-pay | Admitting: General Surgery

## 2011-05-14 DIAGNOSIS — K439 Ventral hernia without obstruction or gangrene: Secondary | ICD-10-CM | POA: Insufficient documentation

## 2011-05-14 NOTE — Progress Notes (Signed)
History: Patient returns for followup for her morbid obesity status post lap band placement and for her large ventral incisional hernia. Since her last visit she has seen Dr. Anda Kraft in [place] and also Dr. Cheryle Horsfall in [place]. They have both indicated that sleeve gastrectomy would be the next logical step for and I certainly agree with this. We discussed that we have just begun to offer sleeve gastrectomy here and that with her particular set of very complex problems that I believe she would be best served at a Center with a longer experience.She is planning to retire she feels she is just not able to be on her feet and be as mobile as is necessary to do her job.  At her last visit we had re\re added 1 cc to her band. She really has not felt much restriction but her weight is down 10 pounds to 369. She feels her hernia is sagging somewhat lower and she feels some occasional pulling or tearing sensations in her flanks.  Exam:  Abdomen: She has had some worsening of her hernia with some increased weakness in the left flank. It is massive but the skin remains intact and her abdomen is soft and nontender.  As she has had some weight loss with reentering fluid and is not feeling significant restriction we went ahead and added a half cc to her band. This brings her to within a half cc of where she felt she was over restrictive and vomiting. Hopefully this can produce at least a little bit of weight loss heading into her sleeve surgery.  She would like to go ahead and pursue sleeve gastrectomy in [place] which I believe would be her safest and best course. We will await this plan

## 2011-05-14 NOTE — Telephone Encounter (Signed)
Left a message for patient to call me. 

## 2011-05-17 ENCOUNTER — Other Ambulatory Visit: Payer: Self-pay | Admitting: *Deleted

## 2011-05-17 NOTE — Telephone Encounter (Signed)
Patient will need Colonoscopy to be done at hospital due to weight. Dr. Juanda Chance, can you do it next Monday AM or on 06/07/11? Please, advise.

## 2011-05-17 NOTE — Telephone Encounter (Signed)
Scheduled colonoscopy at [facility] endo([place]) on 05/24/11 at 10:30 with 9:30 AM arrival. Case number [id number]. Scheduled pre visit on 05/19/11 at 4:30 PM. Patient aware.

## 2011-05-17 NOTE — Telephone Encounter (Signed)
I will be happy to do it next Monday 2/?18/2013 at 10.30 am

## 2011-05-19 ENCOUNTER — Encounter (INDEPENDENT_AMBULATORY_CARE_PROVIDER_SITE_OTHER): Payer: Self-pay | Admitting: General Surgery

## 2011-05-19 ENCOUNTER — Telehealth: Payer: Self-pay | Admitting: Internal Medicine

## 2011-05-19 NOTE — Telephone Encounter (Signed)
Left a message for patient to call me. 

## 2011-05-19 NOTE — Telephone Encounter (Signed)
OK 

## 2011-05-19 NOTE — Telephone Encounter (Signed)
Patient called to cancel pre visit and colonoscopy that was scheduled on 05/24/11. States her sister has been admitted to the hospital out of state and it is not looking good so she must go out of town. Cancelled appointments,

## 2011-05-24 ENCOUNTER — Ambulatory Visit (HOSPITAL_COMMUNITY): Admission: RE | Admit: 2011-05-24 | Payer: 59 | Source: Ambulatory Visit | Admitting: Internal Medicine

## 2011-05-24 ENCOUNTER — Encounter (HOSPITAL_COMMUNITY): Admission: RE | Payer: Self-pay | Source: Ambulatory Visit

## 2011-05-24 SURGERY — COLONOSCOPY
Anesthesia: Moderate Sedation

## 2011-05-26 ENCOUNTER — Encounter: Payer: Self-pay | Admitting: Gynecologic Oncology

## 2011-05-27 ENCOUNTER — Other Ambulatory Visit (HOSPITAL_COMMUNITY)
Admission: RE | Admit: 2011-05-27 | Discharge: 2011-05-27 | Disposition: A | Discharge status: Home / Self Care | Payer: 59 | Source: Ambulatory Visit | Attending: Gynecologic Oncology | Admitting: Gynecologic Oncology

## 2011-05-27 ENCOUNTER — Ambulatory Visit (INDEPENDENT_AMBULATORY_CARE_PROVIDER_SITE_OTHER): Payer: 59 | Admitting: Internal Medicine

## 2011-05-27 ENCOUNTER — Encounter: Payer: Self-pay | Admitting: Internal Medicine

## 2011-05-27 ENCOUNTER — Ambulatory Visit: Payer: 59 | Attending: Gynecologic Oncology | Admitting: Gynecologic Oncology

## 2011-05-27 ENCOUNTER — Encounter: Payer: Self-pay | Admitting: Gynecologic Oncology

## 2011-05-27 ENCOUNTER — Ambulatory Visit: Payer: 59 | Admitting: Internal Medicine

## 2011-05-27 VITALS — BP 130/72 | HR 92 | Ht 68.0 in | Wt 364.8 lb

## 2011-05-27 DIAGNOSIS — Z888 Allergy status to other drugs, medicaments and biological substances status: Secondary | ICD-10-CM | POA: Insufficient documentation

## 2011-05-27 DIAGNOSIS — Z124 Encounter for screening for malignant neoplasm of cervix: Secondary | ICD-10-CM | POA: Insufficient documentation

## 2011-05-27 DIAGNOSIS — Z9089 Acquired absence of other organs: Secondary | ICD-10-CM | POA: Insufficient documentation

## 2011-05-27 DIAGNOSIS — Z885 Allergy status to narcotic agent status: Secondary | ICD-10-CM | POA: Insufficient documentation

## 2011-05-27 DIAGNOSIS — E079 Disorder of thyroid, unspecified: Secondary | ICD-10-CM | POA: Insufficient documentation

## 2011-05-27 DIAGNOSIS — E119 Type 2 diabetes mellitus without complications: Secondary | ICD-10-CM | POA: Insufficient documentation

## 2011-05-27 DIAGNOSIS — Z90710 Acquired absence of both cervix and uterus: Secondary | ICD-10-CM | POA: Insufficient documentation

## 2011-05-27 DIAGNOSIS — E785 Hyperlipidemia, unspecified: Secondary | ICD-10-CM | POA: Insufficient documentation

## 2011-05-27 DIAGNOSIS — Z794 Long term (current) use of insulin: Secondary | ICD-10-CM | POA: Insufficient documentation

## 2011-05-27 DIAGNOSIS — Z881 Allergy status to other antibiotic agents status: Secondary | ICD-10-CM | POA: Insufficient documentation

## 2011-05-27 DIAGNOSIS — Z01419 Encounter for gynecological examination (general) (routine) without abnormal findings: Secondary | ICD-10-CM | POA: Insufficient documentation

## 2011-05-27 DIAGNOSIS — Z7982 Long term (current) use of aspirin: Secondary | ICD-10-CM | POA: Insufficient documentation

## 2011-05-27 DIAGNOSIS — K458 Other specified abdominal hernia without obstruction or gangrene: Secondary | ICD-10-CM | POA: Insufficient documentation

## 2011-05-27 DIAGNOSIS — Z9884 Bariatric surgery status: Secondary | ICD-10-CM | POA: Insufficient documentation

## 2011-05-27 DIAGNOSIS — I10 Essential (primary) hypertension: Secondary | ICD-10-CM | POA: Insufficient documentation

## 2011-05-27 DIAGNOSIS — G4733 Obstructive sleep apnea (adult) (pediatric): Secondary | ICD-10-CM

## 2011-05-27 DIAGNOSIS — C541 Malignant neoplasm of endometrium: Secondary | ICD-10-CM

## 2011-05-27 DIAGNOSIS — C801 Malignant (primary) neoplasm, unspecified: Secondary | ICD-10-CM | POA: Insufficient documentation

## 2011-05-27 DIAGNOSIS — Z8542 Personal history of malignant neoplasm of other parts of uterus: Secondary | ICD-10-CM | POA: Insufficient documentation

## 2011-05-27 NOTE — Progress Notes (Signed)
Consult Note: Gyn-Onc  [name] 61 y.o. female  CC:  Chief Complaint  Patient presents with  . Cancer    HPI: 61 year old with a history of a stage IA grade 1 endometrial carcinoma who underwent exploratory laparotomy TAH/BSO and repair of ventral hernia in August of 2008 a with myself and Dr. Johna Sheriff. I last saw her in January of 2011 which time her exam was limited but was negative and her Pap smear was negative.  She has been following with Dr. Johna Sheriff with regards to her lap band. She has had additional fluid added to it. She has lost about 12 pounds in the last 5 weeks. She is going to be retiring March 6 secondary to her health. She feels very much that she needs to have her hernias repaired and she's been seen by specialist in [place]. The specialist in [place] recommended that she loose weight and she has also had a gastric sleeve in [place]. She does have occasional emesis with her lap band. It's better when she does not eat quickly and when she limits her fluid intake around the time of eating. She denies any vaginal bleeding any change in her bowel bladder habits. She has occasional abdominal pain but does not requiring medications. She denies any headaches. She fell off a chair at work and occasionally has some low back pain secondary to this. There's no numbness with regards to this and the pain does not radiate.  She is due for a mammogram. She is also due for colonoscopy in 2014. She had a CT scan of the abdomen and pelvis November 30 of year 2012. Reveals no evidence of recurrent disease. She is a large anterior abdominal wall hernia containing small bowel and colon but no evidence of an obstruction.  Interval History:  As above  Review of Systems: As above. Otherwise 10 point review of systems is negative.  Current Meds:  Outpatient Encounter Prescriptions as of 05/27/2011  Medication Sig Dispense Refill  . aspirin 81 MG tablet Take 81 mg by mouth daily.          . Calcium Carbonate-Vitamin D (CALCIUM-VITAMIN D) 500-200 MG-UNIT per tablet Take 1 tablet by mouth 2 (two) times daily with a meal.        . cloNIDine (CATAPRES) 0.1 MG tablet Take 0.1 mg by mouth daily.      Marland Kitchen docusate sodium (COLACE) 100 MG capsule Take 100 mg by mouth 2 (two) times daily.       . insulin aspart (NOVOLOG) 100 UNIT/ML injection Inject into the skin as needed.        . insulin glargine (LANTUS) 100 UNIT/ML injection Inject 80 Units into the skin AC breakfast.        . levothyroxine (SYNTHROID, LEVOTHROID) 200 MCG tablet Take 200 mcg by mouth daily.        . metFORMIN (GLUCOPHAGE) 500 MG tablet Take 500 mg by mouth 2 (two) times daily with a meal.        . olmesartan-hydrochlorothiazide (BENICAR HCT) 40-25 MG per tablet Take 1 tablet by mouth daily.      . rosuvastatin (CRESTOR) 20 MG tablet Take 20 mg by mouth daily.          Allergy:  Allergies  Allergen Reactions  . Oxaprozin Other (See Comments)    Causes skin to slough off.  . Ampicillin Itching and Rash    Starts on torso and hands. Spreads up to neck.  . Codeine Itching and Rash  Starts at trunk and hands. Spreads up to the neck.    Social Hx:   History   Social History  . Marital Status: Married    Spouse Name: N/A    Number of Children: N/A  . Years of Education: N/A   Occupational History  . Not on file.   Social History Main Topics  . Smoking status: Never Smoker   . Smokeless tobacco: Never Used  . Alcohol Use: No  . Drug Use: No  . Sexually Active: Not on file   Other Topics Concern  . Not on file   Social History Narrative  . No narrative on file    Past Surgical Hx:  Past Surgical History  Procedure Date  . Appendectomy   . Cesarean section   . Hernia repair   . Abdominal hysterectomy   . Gastric restriction surgery 04/04/2007    lap band  . Tonsillectomy and adenoidectomy     Past Medical Hx:  Past Medical History  Diagnosis Date  . Hypertension   . Thyroid disease    . Diabetes mellitus   . Hyperlipidemia   . Uterine cancer   . Sinus complaint   . Wears glasses   . Joint pain   . Obesity     Family Hx:  Family History  Problem Relation Age of Onset  . Heart disease Mother   . COPD Mother   . Prostate cancer Father   . Diabetes Sister   . Asthma Sister   . Fibromyalgia Sister     Vitals:  Blood pressure 164/70, pulse 88, temperature 97.8 F (36.6 C), temperature source Oral, resp. rate 22, height 5\' 7"  (1.702 m), weight 364 lb (165.109 kg).  Physical Exam: Well-nourished well-developed female in no acute distress.  Neck: Supple no lymphadenopathy no thyromegaly.   Lungs: Clear to auscultation bilaterally   Cardiovascular: Regular rate and rhythm.  Abdomen: Morbidly obese. Very large hernia that precludes any type of abdominal examination for her recurrent disease. Her abdomen and pannus extending over the anterior thigh. Movement of the abdominal wall reveals that the area is clean. There is evidence of some midline breakdown which is healed. There is no obvious groin adenopathy.  Extremities: 1+ nonpitting edema equal bilaterally. Chronic venous stasis changes.  Pelvic: External genitalia within normal limits. Vagina is atrophic. The exam is limited by habitus. The vaginal cuff cannot be completely seen. ThinPrep Pap was submitted without difficulty. Bimanual examination was limited however there is no palpable mass at the vaginal cuff.  Assessment/Plan: 61 year old with stage IA grade 1 endometrioid adenocarcinoma diagnosed in August of 2008 with no evidence of recurrent disease.  Plan, we followup in results of the Pap smear notify her of the results. She will take responsibility for scheduling her mammogram. She'll need to return to see [place] in one year or when necessary.  Cleda Mccreedy A., MD 05/27/2011, 4:27 PM

## 2011-05-27 NOTE — Patient Instructions (Signed)
Order- [facility]- increase CPAP to 13. DX- OSA  Consider the otc caffeine caplets for occasional use.

## 2011-05-27 NOTE — Patient Instructions (Signed)
Return in one year.

## 2011-05-27 NOTE — Progress Notes (Signed)
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 Has noticed she gets sleepy during the day(in hot room). She remains fully compliant with CPAP 12 CWP/Advanced, nasal mask. She has had lap band surgery and is working on weight loss so that she can have abdominal hernia repairs by a Careers adviser in [place]. She is planning to retire so that she can devote full attention to her weight loss effort. 2 cups of coffee in the morning. Admits sleepiness sitting in warm room.  ROS-see HPI Constitutional:   Limited  weight loss, No- night sweats, fevers, chills, fatigue, lassitude. HEENT:   No-  headaches, difficulty swallowing, tooth/dental problems, sore throat,       No-  sneezing, itching, ear ache, nasal congestion, post nasal drip,  CV:  No-   chest pain, orthopnea, PND, swelling in lower extremities, anasarca,  dizziness, palpitations Resp: No- acute  shortness of breath with exertion or at rest.              No-   productive cough,  No non-productive cough,  No- coughing up of blood.              No-   change in color of mucus.  No- wheezing.   Skin: No-   rash or lesions. GI:  No-   heartburn, indigestion, abdominal pain, nausea, vomiting, diarrhea,                 change in bowel habits, loss of appetite GU: MS:  No-   joint pain or swelling.  No- decreased range of motion.  No- back pain. Neuro-     nothing unusual Psych:  No- change in mood or affect. No depression or anxiety.  No memory loss.  OBJ- Physical Exam General- Alert, Oriented, Affect-appropriate, Distress- none acute, morbid obesity Skin- rash-none, lesions- none, excoriation- none Lymphadenopathy- none Head- atraumatic            Eyes- Gross vision intact, PERRLA, conjunctivae and secretions clear            Ears- Hearing, canals-normal            Nose- Clear, no-Septal dev, + clear mucus, No-polyps, erosion,  perforation             Throat- Mallampati IV , mucosa clear , drainage- none, tonsils- atrophic Neck- flexible , trachea midline, no stridor , thyroid nl, carotid no bruit Chest - symmetrical excursion , unlabored           Heart/CV- RRR , 1/6 AS murmur , no gallop  , no rub, nl s1 s2                           - JVD- none , edema- none, stasis changes- none, varices- none           Lung- clear to P&A, wheeze- none, cough- none , dullness-none, rub- none           Chest wall-  Abd-  Br/ Gen/ Rectal- Not done, not indicated Extrem- cyanosis- none, clubbing, none, atrophy- none, strength- nl Neuro- grossly intact to observation

## 2011-05-30 NOTE — Assessment & Plan Note (Signed)
Good compliance and control with CPAP. Notices some daytime sleepiness sitting quietly in a warm room. We discussed increasing pressure one step for observation.

## 2011-06-01 ENCOUNTER — Encounter: Payer: Self-pay | Admitting: Family Medicine

## 2011-06-01 ENCOUNTER — Ambulatory Visit (INDEPENDENT_AMBULATORY_CARE_PROVIDER_SITE_OTHER): Payer: 59 | Admitting: Family Medicine

## 2011-06-01 VITALS — Ht 67.25 in | Wt 359.1 lb

## 2011-06-01 DIAGNOSIS — E119 Type 2 diabetes mellitus without complications: Secondary | ICD-10-CM

## 2011-06-01 NOTE — Patient Instructions (Signed)
-   Explore pool options for water exercise, and bring information to follow-up appt.  Check out:  [facility] [employer], [employer], [employer], [employer], [employer] ([employer]   - Food goals:  - Liquids separate from eating.   - Protein with each meal.    - Non-starchy vegetables with both lunch and dinner.    - At least 2 fruits a day.  (When you eat bananas, choose 1/2 at a time or a junior size.)  - Eat at least 3 meals and 1-2 snacks per day.  Aim for no more than 5 hours between eating. - Weight Watchers membership.

## 2011-06-01 NOTE — Progress Notes (Signed)
Medical Nutrition Therapy:  Appt start time: 1000 end time:  1100.  Assessment:  Primary concerns today: Weight management and Blood sugar control.  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  It has been a difficult decision for her, but she wants to focus on her health.  She is waiting to hear back from a physician in [place] re. gastric sleeve surgery, and she is now very committed to losing weight.  She had another cc of saline in her lapband recently, and is instructed to not drink at the same time as eating.   24-hr recall: B (7 AM)- Advantage protein shake (170 kcal); Snk (9 AM)- Advantage protein shake, (170 kcal), 3 c coffee; L (12 PM)- 5-10 bites of: bbq, pork & beans, mac & cheese; Snk (3 PM)- 3 cupcakes; D (8:30 PM)- 2 c pasta w/ meat sauce; Snk (10 PM)- Splenda tea; Snk (11 PM)- Advantage protein shake (170 kcal).  Recent physical activity includes walking from the parking lot to work.  Exercise is still limited b/c of lower back pain.    Progress Towards Goal(s):  In progress.   Nutritional Diagnosis:  NB-2.1 Physical inactivity As related to bakc pain and deconditioning.  As evidenced by very limited PA right now. NI-5.8.3 Inappropriate intake of types of carbohydrates (specify): sugars As related to sweets/desserts.  As evidenced by cupcakes consumed yesterday, in part resulting from inadequate lunch (over-hungry).    Intervention:  Nutrition counseling.  Monitoring/Evaluation:  Dietary intake, exercise, BG, and body weight in 2 weeks.

## 2011-06-10 ENCOUNTER — Telehealth: Payer: Self-pay | Admitting: Gynecologic Oncology

## 2011-06-10 NOTE — Telephone Encounter (Signed)
Spoke with patient about pap results.  No questions or concerns voiced.

## 2011-06-14 ENCOUNTER — Encounter: Payer: Self-pay | Admitting: Family Medicine

## 2011-06-14 ENCOUNTER — Ambulatory Visit (INDEPENDENT_AMBULATORY_CARE_PROVIDER_SITE_OTHER): Payer: 59 | Admitting: Family Medicine

## 2011-06-14 VITALS — Ht 67.25 in | Wt 357.5 lb

## 2011-06-14 DIAGNOSIS — E119 Type 2 diabetes mellitus without complications: Secondary | ICD-10-CM

## 2011-06-14 NOTE — Progress Notes (Signed)
Medical Nutrition Therapy:  Appt start time: 0900 end time:  1000.  Assessment:  Primary concerns today: Weight management and Blood sugar control.  Rilynne has been trying to follow recommendations from last visit.  She still finds it challenging to eat adequate protein with each meal; sometimes it is just 5 bites.  Another especially challenging aspect of appetite mgmt is limiting sweets, but Anmol has done pretty well with this in the past couple of weeks.  She has kept sweets out of the house.  Keller has been eating more veg's, and she is using fruit for snacks usually.  She has completed paperwork for the exercise program at [facility], BELT, and hopes to be able to start that this week.  Emmalee said FBG has been more consistently closer to 120.    Progress Towards Goal(s):  In progress.   Nutritional Diagnosis:  NB-2.1 Physical inactivity as related to back pain and deconditioning as evidenced by very limited PA right now.  Some progress noted on NI-5.8.3 Inappropriate intake of types of carbohydrates (sugars) as related to sweets/desserts as evidenced by self-report of reduced consumption of such foods.    Intervention:  Nutrition counseling.  Monitoring/Evaluation:  Dietary intake, exercise, BG, and body weight in 2 weeks.

## 2011-06-14 NOTE — Patient Instructions (Addendum)
-   Google "Lafonda Mosses and sugar" and read article sent by email re. blood sugar and self-discipline.  - Let me know what the medication Dr. Evlyn Kanner is recommending for you.   - Daily food records in a dedicated notebook.  Also use this notebook for exercise logging (keep track of progress).   - Last visit's recommendations: - Explore pool options for water exercise, and bring information to follow-up appt. Check out: [employer], [employer] (Look on [employer] for info on this church pool), [employer], [employer], [employer] ([employer]  - Food goals:   - Liquids separate from eating.   - Protein with each meal.   - Non-starchy vegetables with both lunch and dinner.   - At least 2 fruits a day. (When you eat bananas, choose 1/2 at a time or a junior size.)   - Eat at least 3 meals and 1-2 snacks per day. Aim for no more than 5 hours between eating.  - Weight Watchers membership.

## 2011-06-24 ENCOUNTER — Encounter: Payer: Self-pay | Admitting: Family Medicine

## 2011-06-24 ENCOUNTER — Ambulatory Visit: Payer: 59 | Admitting: Family Medicine

## 2011-06-24 NOTE — Progress Notes (Signed)
Medical Nutrition Therapy:  Appt start time: 0900 end time:  0930.  Assessment:  Primary concerns today: Weight management and Blood sugar control.  [name] has been working on her dietary goals, and was surprised to see her weight go up, which may be related to a high-Na meal last night ([place]).  On Wed she started with [facility]'s BELT exercise program for wt mgmt, for which she is eligible for 12 weeks.  She was disappointed to find that she was able to walk only 9 minutes on the TM, but knows she will see improvement if she sticks with it.  There was another participant she met who had lost >100 lb, which was encouraging for [name].  Food record started on Tue showed some lunches/dinners that did not include veg's.  [name] has been more consistent in getting protein at each meal, however, and she is doing well separating foods and liquids.  She also has viewed some of the content on [name] and sugar on the internet, and she has researched water exercise in town, and will be going to the [employer] today.    Progress Towards Goal(s):  In progress.   Nutritional Diagnosis:  NB-2.1 Physical inactivity as related to back pain and deconditioning as evidenced by very limited PA right now.  Some progress noted on NI-5.8.3 Inappropriate intake of types of carbohydrates (sugars) as related to sweets/desserts as evidenced by self-report of reduced consumption of such foods.    Intervention:  Nutrition counseling.  Monitoring/Evaluation:  Dietary intake, exercise, BG, and body weight in 2 weeks.

## 2011-06-24 NOTE — Patient Instructions (Addendum)
-   Vegetables with both lunch and dinner.   - Check for Weight Watchers coupon, and consider this membership.  - Continue to record intake, and review daily.   - Based on your review, set a goal for the next day and/or a self-affirmation.   - Write your goal/affirmation in your purple book.   - Congrat's on getting started with the [employer] exercise and [facility] pool use!    - AT F/U LET'S BE SURE TO REVIEW RECENT BG READINGS.

## 2011-06-28 ENCOUNTER — Encounter: Payer: Self-pay | Admitting: Family Medicine

## 2011-06-28 ENCOUNTER — Ambulatory Visit (INDEPENDENT_AMBULATORY_CARE_PROVIDER_SITE_OTHER): Payer: 59 | Admitting: Family Medicine

## 2011-06-28 DIAGNOSIS — E119 Type 2 diabetes mellitus without complications: Secondary | ICD-10-CM

## 2011-06-28 NOTE — Progress Notes (Signed)
Medical Nutrition Therapy:  Appt start time: 0900 end time:  0930.  Assessment:  Primary concerns today: Weight management and Blood sugar control.  [name] went the [facility] for her first class of water aerobics, which she enjoyed.  She plans to go to the water ex class 4 X wk, and the BELT class at [facility] MWF.  We talked of two incidents last week when [name] ate 2 chocolate bars.  Analyzing these behaviors, she realized it was a response to feeling anxious, as well as simple habit, and there was an element of feeling like this was something she chose to do rather than adhering to what she's told to do.  [name]'s husband likes dessert each night, so [name] has been experimenting with Splenda in homemade desserts.   BG have been lower; highest FBG has been 134; more often 120's.    Progress Towards Goal(s):  In progress.   Nutritional Diagnosis:  NB-2.1 Significant progress on Physical inactivity as related to back pain and deconditioning as evidenced by attendance in water aerobics class once last week, and [facility] BELT class 3 X wk.  Additional progress noted on NI-5.8.3 Inappropriate intake of types of carbohydrates (sugars) as related to sweets/desserts as evidenced by food record showing reduced consumption of such foods.    Intervention:  Nutrition counseling.  Monitoring/Evaluation:  Dietary intake, exercise, BG, and body weight in 1 week.

## 2011-06-28 NOTE — Patient Instructions (Signed)
-   Increase NON-starchy veg's to twice a day.   - Candy:  Journal about what's behind those choices AND consider alternatives to food for achieving the same objectives (rewards).   - Keep up the exercise.  Great job! - Daily food records with daily review.    - SET NEXT DAY'S GOAL AND/OR AFFIRMATION.

## 2011-07-05 ENCOUNTER — Encounter: Payer: Self-pay | Admitting: Family Medicine

## 2011-07-05 ENCOUNTER — Ambulatory Visit (INDEPENDENT_AMBULATORY_CARE_PROVIDER_SITE_OTHER): Payer: 59 | Admitting: Family Medicine

## 2011-07-05 DIAGNOSIS — E119 Type 2 diabetes mellitus without complications: Secondary | ICD-10-CM

## 2011-07-05 NOTE — Patient Instructions (Addendum)
-   Check out [facility] for water exercise [facility]).   - Cereal:  Limit serving size to <1 cup; consider eating with yogurt instead of milk.   - High-quality fats:  XV olive oil, avocado, unsalted nuts & seeds (1-2 tbsp = serving), fish  (1 cup of most nuts = at least 800 kcal.) - Continue keeping a food & exercise record with daily goal and/or affirmation (envision). - Food goal:  Non-starchy veg's twice a day.  (Beans are starchy; peas are "half-starchy," green beans are NOT starchy.) - Check out [employer]in [employer]:  Find non-candy check-out lanes.  Write this in your journal.

## 2011-07-05 NOTE — Progress Notes (Signed)
Medical Nutrition Therapy:  Appt start time: 0900 end time:  1000.  Assessment:  Primary concerns today: Weight management and Blood sugar control.  [name] did water exercise four days last week, and she is continuing BELT program at [employer].  She did well recording food intake, exercise, and BG.   FBG this morning was 118.  Highest FBG was 154 last week, probably related to Novalog dosing the night before.     24-hr recall included Easter dinner at her mother-in-law's.  Estimated intake was ~1300-1400 kcal:  B (10 AM)-  Advantage shake 170 kcal, 17 g pro, 1 g CHO) Snk (11 AM)-  12 oz Coke, peanuts, unsweet tea L (1:30 PM)-  1 c butter beans, 1-2 oz ham, 1 deviled egg, 1 c sweet potato casserole, 1/2 pc choc pie  Snk ( PM)-  None D ( PM)-  Advantage shake, 2 oz 1 % milk Snk ( PM)-  None  [name] said she will consider diet drinks in future.   [name] was dizzy today after her exercise class, and upon leaving.  She plans to take her insulin post-exercise next time.    Progress Towards Goal(s):  In progress.   Nutritional Diagnosis:  NB-2.1 Significant progress on Physical inactivity as related to back pain and deconditioning as evidenced by attendance in water aerobics class 4 X last week, and [facility] BELT class 3 X wk.  Stable progress noted on NI-5.8.3 Inappropriate intake of types of carbohydrates (sugars) as related to sweets/desserts as evidenced by food record showing reduced consumption of such foods.    Intervention:  Nutrition counseling.  Monitoring/Evaluation:  Dietary intake, exercise, BG, and body weight in 2 weeks.

## 2011-07-09 ENCOUNTER — Ambulatory Visit (INDEPENDENT_AMBULATORY_CARE_PROVIDER_SITE_OTHER): Payer: Commercial Managed Care - PPO | Admitting: General Surgery

## 2011-07-09 ENCOUNTER — Encounter (INDEPENDENT_AMBULATORY_CARE_PROVIDER_SITE_OTHER): Payer: Self-pay | Admitting: General Surgery

## 2011-07-09 DIAGNOSIS — C801 Malignant (primary) neoplasm, unspecified: Secondary | ICD-10-CM

## 2011-07-09 NOTE — Progress Notes (Signed)
Chief complaint: Followup lap band and ventral hernia  History: [name] returns for followup of her morbid obesity status post lap band and large recurrent ventral hernia. She is now retired and is dedicating herself to getting regular exercise. She is in the BELT program at [facility]. Also doing water aerobics other days. Estimated previously she had seen Dr. Anda Kraft in [place] he felt she needed weight loss before attempting hernia repair and had recommended a sleeve gastrectomy. Unfortunately her [employer] insurance does not cover sleeve apparently anywhere and this is very discouraging both of [place]. We did a small fill of her lap band at her last visit and she feels she is experiencing appropriate restriction being able to eat very little in the last solid food at breakfast and lunch and more appropriate amounts later in the day. She is staying away from suite liquids which had been a problem for her previously. Her weight overall is down slightly since November but up 3 pounds since her last visit in December.   BP 118/74  Pulse 76  Temp(Src) 97.3 F (36.3 C) (Temporal)  Resp 16  Ht 5\' 8"  (1.727 m)  Wt 353 lb 6.4 oz (160.301 kg)  BMI 53.73 kg/m2 Total weight loss 19 pounds Assessment plan: Mark morbid obesity and extremely large multiply recurrent ventral hernia. She is really trying hard with exercise. We both feel her LAP-BAND is adjusted appropriately. She is also starting on a medication for obesity prescribed by Dr. Evlyn Kanner. She will investigate what the cash price for her sleeve would be in [place]. Will return here for recheck in 2-3 months.

## 2011-07-09 NOTE — Progress Notes (Deleted)
Chief complaint: Followup lap band  History: Patient returns for follow up of lab and placed January 05, 2011. We lasted is still about 6 weeks ago. He may have noted some slight increase in restriction but still feels hungry all the time and is eating almost unlimited. His weight is however down 3-1/2 pounds from his last visit.  Exam: BP 118/74  Pulse 76  Temp(Src) 97.3 F (36.3 C) (Temporal)  Resp 16  Ht 5' 8" (1.727 m)  Wt 353 lb 6.4 oz (160.301 kg)  BMI 53.73 kg/m2 Total weight  Loss 28 pounds General: Appears well Abdomen: Soft nontender port site looks fine.  With this information we went ahead with a 1 cc fill to bring up to 7.5 cc. He was able to tolerate water well. Will return in 5 or 6 weeks. 

## 2011-07-20 ENCOUNTER — Encounter: Payer: Self-pay | Admitting: Family Medicine

## 2011-07-20 ENCOUNTER — Ambulatory Visit (INDEPENDENT_AMBULATORY_CARE_PROVIDER_SITE_OTHER): Payer: 59 | Admitting: Family Medicine

## 2011-07-20 DIAGNOSIS — E119 Type 2 diabetes mellitus without complications: Secondary | ICD-10-CM

## 2011-07-20 NOTE — Patient Instructions (Addendum)
-   Check out [employer] (Food and [employer] [facility]).   - Label leftovers you want for lunch the next day.   - Carry a water bottle with you most of the time.   - Try some cereal with yogurt.  - Affirmations are not directives, but statements, i.e., "I prepare my foods the night before, which helps me make healthy choices the next day." - Google Amy Cuddy and body language TED talk  - Food goals:    1. Veg's twice a day.   2. Protein with each meal.    1. Make a list of protein foods to keep on hand.     2. LABEL those foods that are to be saved for YOU, not for other family members.    3. Reduce cream in coffee to 5; each month, decrease by 1.    4. ANY snacks:  Put on a plate or napkin or in a bowl.

## 2011-07-20 NOTE — Progress Notes (Signed)
Medical Nutrition Therapy:  Appt start time: 0900 end time:  1000.  Assessment:  Primary concerns today: Weight management and Blood sugar control.  [name] tried the elliptical machine at the [employer] yesterday.  She only stayed on a minute b/c she was afraid of falling.  She also purchased a TM for home use, which she has been using at home on weekends for a few minutes at a time.  [name] has still been going to the BELT program each MWF, and to water exercise M thru [place].  She walked away from buying both a drink and candy at the checkout yesterday.  [name] started Qsymia (phentermine and topiramate) ~10 days ago, which she feels has helped manage her appetite.  She will ask Dr. Evlyn Kanner if she can change this to generic phentermine 15, topiramate 25 to save a significant amount of money.  FBG 134 yesterday; usually 126-134 (two @ 118).   An obstacle to better lunch choices is having foods readily available when [name] is tired and hungry after AM exercise.  Her son and husband have usually consumed leftovers from the previous night's dinner, sometimes even when [name] has asked that they save some for her.    24-hr recall:  B (6 AM)-  protein shake (17 g pro, 170 kcal) 6:30 AM-  [facility] BELT program  (8:15 AM) 16 oz [employer] coffee w/ 7 Spenda & 7 creams     (9:30) 1/2 pack graham cracker rectangles Snk (2 PM)-  Protein shake L ( PM)-  none Snk (5 PM)-  Protein shake D (9 PM)-  2 oz pork chops, 1 c peeled sweet potato w/ Splenda, 3 baby carrots Snk ( PM)-  9 PM to bed   Progress Towards Goal(s):  In progress.   Nutritional Diagnosis:  NB-2.1 Significant progress on Physical inactivity as related to back pain and deconditioning as evidenced by attendance in water aerobics class 4 X last week, and [facility] BELT class 3 X wk.  Stable progress noted on NI-5.8.3 Inappropriate intake of types of carbohydrates (sugars) as related to sweets/desserts as evidenced by food record showing reduced consumption of such  foods.    Intervention:  Nutrition counseling.  Monitoring/Evaluation:  Dietary intake, exercise, BG, and body weight in 1 week.

## 2011-07-26 ENCOUNTER — Ambulatory Visit (INDEPENDENT_AMBULATORY_CARE_PROVIDER_SITE_OTHER): Payer: 59 | Admitting: Family Medicine

## 2011-07-26 ENCOUNTER — Encounter: Payer: Self-pay | Admitting: Family Medicine

## 2011-07-26 ENCOUNTER — Ambulatory Visit: Payer: 59 | Admitting: Family Medicine

## 2011-07-26 DIAGNOSIS — E119 Type 2 diabetes mellitus without complications: Secondary | ICD-10-CM

## 2011-07-26 NOTE — Progress Notes (Signed)
Medical Nutrition Therapy:  Appt start time: 1430 end time:  1530.  Assessment:  Primary concerns today: Weight management and Blood sugar control.  [name] continues her BELT program MWF, and water exercise a total of 5 X last week.  Dr. Reine Just of the BELT program suggested [name] skip her water class on Wed due to knee pain, which has improved, but she still went to the pool on Thur, Fri, and Sat.  [name] said food is still the biggest struggle.  She has cut down to 5 creamers in her coffee.  We talked about ways in which [name] could have improved yesterday's intake (add veg's).  She will be accompanying her sister to [place] next week (Sun thru Sat) for a medical procedure, which will mean restaurant meals all week.  She anticipates significant walking, and will plan to use the hotel's pool as well.    24-hr recall suggests intake of ~1000 kcal:  B (10:30 AM)-  Protein shake (170 kcal, 17 g pro) Snk (11:15)-  12 oz coffee, 2 T cream L (2 PM)-  2 hot dogs w/ 2 T chili (no bun) Snk (8 PM)-  Diet sweet tea 3-8 PM-  napped D (8:30 PM)-  1 1/2 chx crescent roll: chx, butter, cream cheese, S&P in crescent roll, strawberries, Redi-Whip Snk ( PM)-  none  Progress Towards Goal(s):  In progress.   Nutritional Diagnosis:  NB-2.1 Significant progress on Physical inactivity as related to back pain and deconditioning as evidenced by attendance in water aerobics class 4 X last week, and [facility] BELT class 3 X wk.  Stable progress noted on NI-5.8.3 Inappropriate intake of types of carbohydrates (sugars) as related to sweets/desserts as evidenced by food record showing reduced consumption of such foods.    Intervention:  Nutrition counseling.  Monitoring/Evaluation:  Dietary intake, exercise, BG, and body weight in 1 week.

## 2011-07-26 NOTE — Patient Instructions (Addendum)
-   Meals that include at least 3 components are more satisfying than those with fewer.    - Think about how you can add to a one-component meal without adding a lot of kcal.  - Reminder:  ALL dried beans and peas as well as lentils are starchy.   - Travel next week:    - Breakfast in room, i.e., yogurt, protein shake, fruit, protein bars, string cheese.   - Call to ask if there's a microwave and refrigerator in the room, and ask re. yogurt and fresh fruit availability.      - Attention to your GOALS SHEET & JOURNAL.    - Come up with a reminder phrase to go to [place] with you, and email it to [place] no later than Friday; read your phrase once daily while away.   - Bring along your "reminder shell" as well.   NOTE:  YOUR APPT ON MAY 13 IS REALLY AT 12 (NOT 11).

## 2011-08-02 ENCOUNTER — Ambulatory Visit: Payer: 59 | Admitting: Family Medicine

## 2011-08-09 ENCOUNTER — Ambulatory Visit: Payer: 59 | Admitting: Family Medicine

## 2011-08-16 ENCOUNTER — Ambulatory Visit: Payer: 59 | Admitting: Family Medicine

## 2011-08-17 ENCOUNTER — Ambulatory Visit: Payer: 59 | Admitting: Family Medicine

## 2011-08-23 ENCOUNTER — Ambulatory Visit: Payer: 59 | Admitting: Family Medicine

## 2011-09-06 ENCOUNTER — Ambulatory Visit (INDEPENDENT_AMBULATORY_CARE_PROVIDER_SITE_OTHER): Payer: 59 | Admitting: Family Medicine

## 2011-09-06 ENCOUNTER — Encounter: Payer: Self-pay | Admitting: Family Medicine

## 2011-09-06 VITALS — Ht 67.25 in | Wt 338.5 lb

## 2011-09-06 DIAGNOSIS — E119 Type 2 diabetes mellitus without complications: Secondary | ICD-10-CM

## 2011-09-06 NOTE — Patient Instructions (Addendum)
-   Continue exercise classes.    - In addition to exercise, be conscious of, and try to increase activities through the day.    - Google Casimiro Needle Mosley's "The Truth about Exercise."   - Re-start journaling.    "Homework" from previous visit:  - Check out [employer] (Food and [employer] [facility]).   - Google Amy Cuddy and body language TED talk.

## 2011-09-06 NOTE — Progress Notes (Signed)
Medical Nutrition Therapy:  Appt start time: 1430 end time:  1530.  Assessment:  Primary concerns today: Weight management and Blood sugar control.  [name] is down from 100 to 50 units of Lantus, and is seldom using sliding scale Novalog now.  FBG has been 100-110.  [name] did well during her 3 1/2 weeks in [place], but has started to feel drawn to sweets again.  She got home 2 wk ago today, and has had sweets 4 times since being home.  [name] did journal and keep track of her goals while in [place].  [name] is back to BELT program 3 X wk, and she is doing water exercise 3 X wk as well.  Today at BELT, she did 12 min TM, then exercise ball, then 20 min TM at the end, w/ HR running ~124.  She is eating veg's 2 X day, protein with both lunch and dinner, and is getting a protein shake usually 3 X day.    Progress Towards Goal(s):  In progress.   Nutritional Diagnosis:  NB-2.1 Continued progress on Physical inactivity as related to back pain and deconditioning as evidenced by attendance in water aerobics class 3 X week, and [facility] BELT class 3 X wk.  Stable progress noted on NI-5.8.3 Inappropriate intake of types of carbohydrates (sugars) as related to sweets/desserts as evidenced by reported consumption of sweets 4 X in 2 wks.    Intervention:  Nutrition counseling.  Monitoring/Evaluation:  Dietary intake, exercise, BG, and body weight in 1 week.

## 2011-09-13 ENCOUNTER — Ambulatory Visit (INDEPENDENT_AMBULATORY_CARE_PROVIDER_SITE_OTHER): Payer: 59 | Admitting: Family Medicine

## 2011-09-13 ENCOUNTER — Encounter: Payer: Self-pay | Admitting: Family Medicine

## 2011-09-13 DIAGNOSIS — E119 Type 2 diabetes mellitus without complications: Secondary | ICD-10-CM

## 2011-09-13 NOTE — Patient Instructions (Addendum)
-   TASTE PREFERENCES ARE LEARNED.  This means that it will get easier to choose foods you know are good for you if you are exposed to them enough.   - Journal this week, including food record AND reflection on emotions.   - Continue to work on the same goals:  1. By Mon, 22, lose at least 15 more lb (appts / Drs Evlyn Kanner & Hoxworth).   2. Get protein with each meal;veg's at least 2 X day; activity throughout the day; journal daily.  "Homework" from previous visit:  - Check out [employer] (Food and [employer] [facility]).  - Google Amy Cuddy and body language TED talk.  Appts (Mondays @ 2:30):   June 24 July 1 July15 July 22 July 29

## 2011-09-13 NOTE — Progress Notes (Signed)
Medical Nutrition Therapy:  Appt start time: 1430 end time:  1530.  Assessment:  Primary concerns today: Weight management and Blood sugar control.  [name] has been back in town for ~3 wks now, and she feels she has started to catch up on her fitness level again.  Did 30 min on the TM this AM, despite disliking the TM severely).  She has been doing an arthritis class at [facility] 3 X week, and water aerobics 2 X wk.  She is relying on protein shakes for a lot of her protein, but she did have 2 oz of beef a couple of times last week.  [name]'s husband and son are starting to get better at leaving the leftovers for her lunch the next day.   [name] has been taking Qsymia for ~2 wks, which she thinks has taken the edge off her appetite.  [name] has 37 more lb to lose before she can get the bariatric surgery from Dr. Marvene Staff in [place].  On Friday, at [facility] pharmacy appt, her A1C was 6.2.  Although [name] has moments of difficulty (never fast enough loss), she is doing very well overall, and remains motivated.    24-hr recall:  B (9 AM)-  EAS protein shake (17 g pro, 1 g CHO, 100 kcal) [place] Snk (11 AM)- 12 oz Coke, 2 small pks peanuts   L (1 PM)-  Diet lemonade, 1 oz veg-hamburger soup, 3 meat balls Snk ( PM)-  water D (7 PM)-  Diet lemonade, >1 c veg-hamburger soup Snk ( PM)-  none   We talked about [name]'s choice while driving of the Coke and peanuts, an established habit she enjoys.  Discussed "taste preferences are learned."  Progress Towards Goal(s):  In progress.   Nutritional Diagnosis:  NB-2.1 Continued progress on Physical inactivity as related to back pain and deconditioning as evidenced by attendance in water aerobics class 2 X week, arthritis water class 3 X wk, and [facility] BELT class 3 X wk.  Stable progress noted on NI-5.8.3 Inappropriate intake of types of carbohydrates (sugars) as related to sweets/desserts as evidenced by reported consumption of no sweets.    Intervention:  Nutrition  counseling.  Monitoring/Evaluation:  Dietary intake, exercise, BG, and body weight in 2 weeks.

## 2011-09-27 ENCOUNTER — Ambulatory Visit (INDEPENDENT_AMBULATORY_CARE_PROVIDER_SITE_OTHER): Payer: 59 | Admitting: Family Medicine

## 2011-09-27 ENCOUNTER — Encounter: Payer: Self-pay | Admitting: Family Medicine

## 2011-09-27 NOTE — Patient Instructions (Addendum)
-   Journal more about thoughts and feelings.  Aim for daily.  - Travel or busy days:  PLAN AHEAD for foods.  Remember to fit in your veg's and fruit.    - Think about the when, where, and what of meals:   - When will I be able to eat [lunch]?   - Where will I be at [lunch]time?   - What will I eat? - Impulse buys:  Think twice about your choice, and ask:  Is there a version of this meal that is healthier?  And do I want to have this food in my house, under my nose?   - Your boys can fend for themselves.  You do not have to cater to their culinary whims.  Think instead about what is in your best interest (as well as theirs).  - Lynnell Grain book:  Email [name] if you want to borrow his older one.  - Same goals:    - protein with each meal  - veg's at least twice a day  - journal daily  - increased daily activity - Google Leland Johns video The Truth about Exercise.

## 2011-09-27 NOTE — Progress Notes (Signed)
Medical Nutrition Therapy:  Appt start time: 1430 end time:  1515.  Assessment:  Primary concerns today: Weight management and Blood sugar control.  [name]'s sister had her surgery last Thur to attach her esophagus to her jejunum, so [name] has spent a lot of time at the hospital in [place].  [name] feels she has become more reflective recently, including making more conscious choices, although she has had some setbacks as well (see 24-hr recall).  No weight loss this week.   Highest BG has been 143 (last night); FBG have been 11-128.    24-hr recall:  B (10:30 AM)-  11 oz protein shake Snk (10:45)-  1 c coffee w/ Splenda, 1 T cream L (12:30 PM)-  [place] pimiento cheese & bacon biscuit Snk ( PM)-  none D ( PM)-  1 slc sausage & pepperoni pizza, Splenda tea Snk ( PM)-  none  Progress Towards Goal(s):  In progress.   Nutritional Diagnosis:  NB-2.1 Continued progress on Physical inactivity as related to back pain and deconditioning as evidenced by continued attendance in water aerobics class 2 X week, arthritis water class 3 X wk, and [facility] BELT class 3 X wk.  Stable progress noted on NI-5.8.3 Inappropriate intake of types of carbohydrates (sugars) as related to sweets/desserts as evidenced by reported consumption of no sweets.    Intervention:  Nutrition counseling.  Monitoring/Evaluation:  Dietary intake, exercise, BG, and body weight in 1 week.

## 2011-10-04 ENCOUNTER — Ambulatory Visit: Payer: 59 | Admitting: Family Medicine

## 2011-10-06 ENCOUNTER — Encounter: Payer: Self-pay | Admitting: Family Medicine

## 2011-10-06 ENCOUNTER — Ambulatory Visit (INDEPENDENT_AMBULATORY_CARE_PROVIDER_SITE_OTHER): Payer: 59 | Admitting: Family Medicine

## 2011-10-06 DIAGNOSIS — E119 Type 2 diabetes mellitus without complications: Secondary | ICD-10-CM

## 2011-10-06 NOTE — Progress Notes (Signed)
Medical Nutrition Therapy:  Appt start time: 1430 end time:  1515.  Assessment:  Primary concerns today: Weight management and Blood sugar control.  [name] has been doing her usual exercise at the BELT program (now doing 30 min on the elliptical) and her water arthritis class as well as 3 X wk and another water aerobics class 4 times a week.  A big difference from several weeks ago is that [name] is not now feeling exhausted after her class.  [name] said she has been getting protein each meal, and getting veg's twice a day.  She has also been slowing down her eating and chewing well.  She has thrown up only once last week (previously had been vomiting ~twice a week).  [name]'s sister became septic last week, and had an 8-hr emergency surgery, and her life is at risk at this time.  It has been, consequently, an extremely stressful week; yet, [name] has managed very well in light of all that is happening, including driving to [place] where her sister is.  Currently, [name]'s exercise is helping her cope with all the stress.     Progress Towards Goal(s):  In progress.   Nutritional Diagnosis:  NB-2.1 Continued progress on Physical inactivity as related to back pain and deconditioning as evidenced by continued attendance in water aerobics class 2 X week, arthritis water class 3 X wk, and [facility] BELT class 3 X wk.  Stable progress noted on NI-5.8.3 Inappropriate intake of types of carbohydrates (sugars) as related to sweets/desserts as evidenced by reported consumption of no sweets.    Intervention:  Nutrition counseling.  Monitoring/Evaluation:  Dietary intake, exercise, BG, and body weight in 1 week.

## 2011-10-06 NOTE — Patient Instructions (Addendum)
Same goals as last week: - protein with each meal  - veg's at least twice a day  - journal daily  - increased daily activity  - Google Leland Johns video "The Truth about Exercise" - Denyce Robert "[facility]" from last Saturday [facility]) re. pt safety  Next appts: July 15 & 29 at 2:30 PM.

## 2011-10-18 ENCOUNTER — Ambulatory Visit (INDEPENDENT_AMBULATORY_CARE_PROVIDER_SITE_OTHER): Payer: 59 | Admitting: Family Medicine

## 2011-10-18 ENCOUNTER — Encounter: Payer: Self-pay | Admitting: Family Medicine

## 2011-10-18 DIAGNOSIS — E119 Type 2 diabetes mellitus without complications: Secondary | ICD-10-CM

## 2011-10-18 NOTE — Patient Instructions (Addendum)
-   Email [name] to request article re. sweeteners.  - Continue to challenge yourself with the exercise intensity.   - Take an inventory of your usual intake of artificial sweeteners.    - Try to cut back by half either by dilution or by less frequent consumption.   - You may want to try mixing some (flat) seltzer with a sweet drink.  - Journal by computer.  (Give it a try.) - Cliffton Asters video:  Http://vimeo.NFA/21308657  - Food goals remain the same:  Protein with each meal; 3 meals a day (+ snacks as appropriate); veg's 2 X day.

## 2011-10-18 NOTE — Progress Notes (Signed)
Medical Nutrition Therapy:  Appt start time: 1430 end time:  1530.  Assessment:  Primary concerns today: Weight management and Blood sugar control.  [name] is very frustrated that her weight has not gone down more than 2 lb in 2 wks b/c she has been exercising a lot and making careful food choices.  She had only two incidents of choices that were less than ideal, which she described, and I would consider minor indulgences (both were eating out with her family).   [name]'s exercise routine the past two weeks:   Mon & Wed:  BELT program (34 min elliptical following 8 min on TM, 20 min resistance training); arthritis water class, & water aerobics class  Tue & Thur:  AM & PM water aerobics classes Fri:  BELT class & arthritis water class  Sat:  Water Zumba class We discussed [name]'s high use of artificial sweeteners today, in light of recent speculation about their potential role in obesity and metabolic dysfunction.   BGs have remained in good control.     24-hr recall suggests intake of <700 kcal:  UP at 9 AM B (9:30 AM)-  8 oz EAS protein shake (17 g protein, 1 g CHO, 100 kcal) Snk ( AM)-  none L (2 PM)-  (hospital caf) 1 c veg soup Snk (304 PM)- Diet Snapple Tea D (7:15 PM)-  (Ruby Tuesday) 3 oz sirloin, butternut squash, zucchini, 1/2 small biscuit, diet tea Snk ( PM)-  none  Today:    B (6 AM)-  8 oz EAS protein shake (17 g protein, 1 g CHO),  Snk ( AM)-  16 oz decaf coffee w/ 7 Splendas & milk  L (12 PM)-  8 oz EAS protein shake (17 g protein, 1 g CHO) Snk ( PM)-   D ( PM)-   Snk ( PM)-    Progress Towards Goal(s):  In progress.   Nutritional Diagnosis:  NB-2.1 Continued progress on Physical inactivity as related to back pain and deconditioning as evidenced by continued attendance in water aerobics class 6 X week, arthritis water class 2 X wk, and [facility] BELT class 3 X wk.  Stable progress noted on NI-5.8.3 Inappropriate intake of types of carbohydrates (sugars) as related to  sweets/desserts as evidenced by reported consumption of no caloric sweets.    Intervention:  Nutrition counseling.  Monitoring/Evaluation:  Dietary intake, exercise, BG, and body weight in 1 week.

## 2011-10-25 ENCOUNTER — Encounter (INDEPENDENT_AMBULATORY_CARE_PROVIDER_SITE_OTHER): Payer: Self-pay | Admitting: General Surgery

## 2011-10-25 ENCOUNTER — Ambulatory Visit (INDEPENDENT_AMBULATORY_CARE_PROVIDER_SITE_OTHER): Payer: Commercial Managed Care - PPO | Admitting: General Surgery

## 2011-10-25 NOTE — Progress Notes (Signed)
Chief complaint: Followup of morbid obesity, LAP-BAND, recurrent ventral hernia. History: Patient returns for followup of above. She is now retired and has been able to defecate herself to working on her health in routine exercise. She has been diligent about daily exercise. She is doing water fitness on a daily basis and also participating in the BELT program. She has done very well over the last 10 weeks losing 28 pounds for now total weight loss of 46 pounds. She is on reduced doses of insulin.  She feels some slight easing of restriction in terms of her lap band being able to be a little bit more at a time. She has no symptoms of over restriction.  Exam: BP 130/80  Pulse 94  Temp 98 F (36.7 C) (Temporal)  Resp 20  Ht 5\' 8"  (1.727 m)  Wt 325 lb 4 oz (147.532 kg)  BMI 49.45 kg/m2 Weight lost 28 pounds since last visit and total weight loss since surgery 04/04/2007 46 pounds General: Morbidly obese but otherwise well-appearing Abdomen: Massive ventral hernia soft and nontender and unchanged from previous exams  Assessment and plan: I congratulated her on her success. I think this is a very good start on her rededicated efforts. We elected to go ahead with a fill today and I added one half cc to her band which should bring her to 9.5 cc total. She was able to tolerate water well. She is to return in 3 months. I told her that she should be reevaluated in [place] for possible hernia repair when she is down below 300 pounds.

## 2011-11-01 ENCOUNTER — Ambulatory Visit (INDEPENDENT_AMBULATORY_CARE_PROVIDER_SITE_OTHER): Payer: 59 | Admitting: Family Medicine

## 2011-11-01 ENCOUNTER — Encounter: Payer: Self-pay | Admitting: Family Medicine

## 2011-11-01 DIAGNOSIS — E119 Type 2 diabetes mellitus without complications: Secondary | ICD-10-CM

## 2011-11-01 NOTE — Progress Notes (Signed)
Medical Nutrition Therapy:  Appt start time: 1430 end time:  1530.  Assessment:  Primary concerns today: Weight management and Blood sugar control.  [name] had her band filled a week ago (1/2 cc).  She has done ok with it, but vomited after last night's dinner, which probably included too much volume w/ soup.  [name] has continued her exercise routine of 9 water workouts and the BELT exercise 3 X wk, and she has pushed the intensity.  She did not watch the [facility] exercise video yet, but did read [employer] book, "Mindless Eating," which she hopes to re-read.  She journaled some, but not consistently this week.  She has also had some dialogue with her husband and son re. coming with her at least one time to one [facility]   [name] has been meeting her food goals of eating at least 3 times per day, veg's twice a day, and protein at each meal.  There have been a few incidents of sweets consumption, i.e., after the funeral Saturday, [name] had a few bites of cake, but stopped after deciding it was not that good.  This represents a victory of sorts.    24-hr recall was atypical, seeping until 1 PM, following very late bedtime Sat night; a full day with her aunt's funeral, and driving home late): B ( AM)-  Slept Snk ( AM)-  Slept L (1:30 PM)-  Protein shake (17 g pro, 100 kcal) Snk ( PM)-  Protein shake, 1 c Splenda tea  D (8 PM)-  1 crab ragout, wonton soup, 3-4 wontons, 1 fortune cookie (vomited after meal) Snk ( PM)-  1 c Splenda tea  Progress Towards Goal(s):  In progress.   Nutritional Diagnosis:  NB-2.1 Continued progress on Physical inactivity as related to back pain and deconditioning as evidenced by continued attendance in water aerobics class 6 X week, arthritis water class 2 X wk, and [facility] BELT class 3 X wk.  Stable progress noted on NI-5.8.3 Inappropriate intake of types of carbohydrates (sugars) as related to sweets/desserts as evidenced by reported consumption of  no caloric sweets.    Intervention:  Nutrition counseling.  Monitoring/Evaluation:  Dietary intake, exercise, BG, and body weight in 3 weeks.

## 2011-11-01 NOTE — Patient Instructions (Addendum)
-   Look at your most recent lipid profile, and compare it to the previous.   - Artificial sweeteners:  Still a lot we don't know.  In light of this, it's probably prudent to aim for reducing intake.    - This is all about shaping taste preferences.    - e.g., cut down to 6 pkts vs. 7 in a large coffee.   - Food goals remain the same:    1. Eat at least 3 meals and 1-2 snacks per day.   2. Vegetables twice a day.   3. Protein with each meal.   - Journal:  Write at least once a week about an incident where you made a very conscious (mindful) food choice.  - Write also each day:  Your goal/focus for tomorrow.   - Watch [employer].

## 2011-11-22 ENCOUNTER — Encounter: Payer: Self-pay | Admitting: Family Medicine

## 2011-11-22 ENCOUNTER — Ambulatory Visit (INDEPENDENT_AMBULATORY_CARE_PROVIDER_SITE_OTHER): Payer: 59 | Admitting: Family Medicine

## 2011-11-22 DIAGNOSIS — E119 Type 2 diabetes mellitus without complications: Secondary | ICD-10-CM

## 2011-11-22 NOTE — Patient Instructions (Addendum)
-   Snack alternatives:    Roasted, unsalted nuts ([employer]).  Limit serving size to 2 tbsp.  (Remember 1 cup = 800 kcal.)  Yogurt  String cheese  Fruit - Snack bar rule:  No more than one package of almonds at a time.   - This week's priority:  Meeting about your help in [place], and your need for a place for you.  - Journal daily:  Tomorrow's focus or goal.    - (And journal as you feel the desire.)

## 2011-11-22 NOTE — Progress Notes (Signed)
Medical Nutrition Therapy:  Appt start time: 1430 end time:  1530.  Assessment:  Primary concerns today: Weight management and Blood sugar control.  [name] has continued to exercise consistently.  She said she rarely feels hungry, but is sometimes tempted by sweets, i.e., the choc-covered almonds at the hospital snack bar after visiting her sister.  She has figured out this is difficult to resist for 2 reasons:  [name] is often somewhat hungry after few good choices in the hospital cafeteria, and the stress of dealing with her sister's health situation prompts her wanting comfort food.  [name] did journal some in the past couple weeks, and she has realized through doing so that she is unhappy with her life right now.  She is losing weight consistently, but not as fast as she'd like, and caring for her sister (necessitating a 2-hr drive each way 3 X wk) is very stressful.  [name] has no real fun in her life right now, and much drudgery.  Once she reaches 300 lb, she may be able to get the hernia surgery she's aiming for, however, and this will be a milestone for her.  In addn. [name] does acknowledge some signs of progress:  BG is running even lower, 119-140, and [name] has lowered her Lantus to 30-40 units in the am.  [name]'s lipids are improved, as shown by her last 3 Lipid Profiles: 11-05-10   05-17-11  10-25-11 TC 143  114   110 TG 132  197   149 HDL   32   37     35 LDL   85     38     46  24-hr recall:  Up at 10 AM B (10 AM)-  EAS Protein shake (17 g protein)  Snk (11:15)-  2 c Cheerios, 1/2 c 1% milk L (1:45 PM)-  8 corn chips Snk ( PM)-  none D (7:30 PM)-  2 fried drumsticks (skin pulled off), 1 tbsp slaw, 1 tbsp mac & chs (threw up)  Snk ( PM)-  Protein shake   Progress Towards Goal(s):  In progress.   Nutritional Diagnosis:  NB-2.1 Continued progress on Physical inactivity as related to back pain and deconditioning as evidenced by continued attendance in water aerobics class 6 X week, arthritis water  class 2 X wk, and [facility] BELT class 3 X wk.  Stable progress noted on NI-5.8.3 Inappropriate intake of types of carbohydrates (sugars) as related to sweets/desserts as evidenced by reported consumption of no caloric sweets.    Intervention:  Nutrition counseling.  Monitoring/Evaluation:  Dietary intake, exercise, BG, and body weight in 2 weeks.

## 2011-11-29 ENCOUNTER — Ambulatory Visit: Payer: 59 | Admitting: Family Medicine

## 2011-12-08 ENCOUNTER — Ambulatory Visit: Payer: 59 | Admitting: Family Medicine

## 2011-12-13 ENCOUNTER — Ambulatory Visit: Payer: 59 | Admitting: Family Medicine

## 2011-12-20 ENCOUNTER — Ambulatory Visit: Payer: 59 | Admitting: Family Medicine

## 2011-12-23 ENCOUNTER — Encounter: Payer: Self-pay | Admitting: Family Medicine

## 2011-12-27 ENCOUNTER — Encounter: Payer: Self-pay | Admitting: Family Medicine

## 2011-12-27 ENCOUNTER — Ambulatory Visit: Payer: 59 | Admitting: Family Medicine

## 2011-12-27 ENCOUNTER — Ambulatory Visit (INDEPENDENT_AMBULATORY_CARE_PROVIDER_SITE_OTHER): Payer: 59 | Admitting: Family Medicine

## 2011-12-27 DIAGNOSIS — E119 Type 2 diabetes mellitus without complications: Secondary | ICD-10-CM

## 2011-12-27 NOTE — Patient Instructions (Addendum)
-   Try to maintain SOME activity on non-exercise days.  Aim for early-in-the-day activity.    - Remember principles of NEAT (non-exercise activity thermogenesis).   - Daily food record for the next two weeks.   - Emphasis on whole, real food, with less processed food.

## 2011-12-27 NOTE — Progress Notes (Signed)
Medical Nutrition Therapy:  Appt start time: 1100 end time:  1200.  Assessment:  Primary concerns today: Weight management and Blood sugar control.  [name] did a total of 42 min on the elliptical this morning at a resistance of 3 and incline of 3, w/ a HR of 127 (has started arriving at 5:50 AM to make sure she gets a machine).  She feels she needs to increase her exercise intensity, and plans to make sure her HR gets up to 133 (but not above).  She is not sure why no wt loss this week, but thinks it is likely food-related, e.g., recently bought some [employer] bars that she assumed were low in both carb's and kcal, but had more of both, discovered after eating them.   24-hr recall suggests intake of <550 kcal:  B (10:30 AM)-  Atkins Shake (17 g pro, 100 kcal) Snk ( AM)-  none L (2:30 PM)-  Atkins Shake (17 g pro, 100 kcal) Snk (3:30)-  1 string cheese, tea Snk (5 PM)-  1 pkg Slim Fast low-CHO nacho chips (150 kcal) D (8 PM)-  3 oz beef ribs Snk (9 PM)-  tea NO activity yesterday.  Progress Towards Goal(s):  In progress.   Nutritional Diagnosis:  NB-2.1 Continued progress on Physical inactivity as related to back pain and deconditioning as evidenced by continued attendance in water aerobics class 6 X week, arthritis water class 2 X wk, and [facility] BELT class 3 X wk.  Stable progress noted on NI-5.8.3 Inappropriate intake of types of carbohydrates (sugars) as related to sweets/desserts as evidenced by reported consumption of no caloric sweets.    Intervention:  Nutrition counseling.  Monitoring/Evaluation:  Dietary intake, exercise, BG, and body weight in 2 weeks.

## 2012-01-03 ENCOUNTER — Ambulatory Visit (INDEPENDENT_AMBULATORY_CARE_PROVIDER_SITE_OTHER): Payer: 59 | Admitting: Family Medicine

## 2012-01-03 ENCOUNTER — Encounter: Payer: Self-pay | Admitting: Family Medicine

## 2012-01-03 DIAGNOSIS — E119 Type 2 diabetes mellitus without complications: Secondary | ICD-10-CM

## 2012-01-03 NOTE — Patient Instructions (Addendum)
-   Possible reasons for plateauing:  1. Sugar  2. Inadequate exercise intensity  3. Not tracking intake  4. Increased intake of processed foods - Goals:  1. Daily food records; email [name] at least 3 days of food records in the next week.    2. Continue to increase exercise intensity.  Consult with BELT staff about some interval work.    3. Find a handful of recipes from internet or books lent today.  Bring these to follow-up appt.    - Check out recipes on [email or web address]

## 2012-01-03 NOTE — Progress Notes (Signed)
Medical Nutrition Therapy:  Appt start time: 1330 end time:  1430.  Assessment:  Primary concerns today: Weight management and Blood sugar control.  Galena went to her husband's family reunion last weekend, and although she started out with moderate choices, she feels she went overboard with sweets.  She monitored BG several times that day, however, and never had a reading >170s.  Last night she went out to eat, and ate two rolls with dinner, which is also an anomaly for recent food choices.  This AM she did both the BELT program (44 min on the elliptical at resistance & incline of 5 & 5) as well TWO water ex classes.  In addn to food choices mentioned above, Ariauna wonders if recent increase in use of processed foods in cooking may be contributing to no weight loss in the past couple of weeks.  She has been experimenting with some new recipes after her family has been complaining of the same meals repeated too often.    Progress Towards Goal(s):  In progress.   Nutritional Diagnosis:  NB-2.1 Continued progress on Physical inactivity as related to back pain and deconditioning as evidenced by continued attendance in water aerobics class 6 X week, arthritis water class 2 X wk, and [employer] BELT class 3 X wk.  Regression on NI-5.8.3 Inappropriate intake of types of carbohydrates (sugars) as related to sweets/desserts as evidenced by reported consumption of several sweets last weekend.    Intervention:  Nutrition counseling.  Monitoring/Evaluation:  Dietary intake, exercise, BG, and body weight in 2 weeks.

## 2012-01-10 ENCOUNTER — Encounter: Payer: Self-pay | Admitting: Family Medicine

## 2012-01-10 ENCOUNTER — Ambulatory Visit (INDEPENDENT_AMBULATORY_CARE_PROVIDER_SITE_OTHER): Payer: 59 | Admitting: Family Medicine

## 2012-01-10 DIAGNOSIS — E119 Type 2 diabetes mellitus without complications: Secondary | ICD-10-CM

## 2012-01-10 NOTE — Progress Notes (Signed)
Medical Nutrition Therapy:  Appt start time: 1330 end time:  1430.  Assessment:  Primary concerns today: Weight management and Blood sugar control.  Avonlea had a family reunion yesterday, and feels she made better choices this time.  She feels part of the wt loss this week is attributable to more physical activity, i.e., yard work and not napping on most days.    She emailed me three days of food records last week, which revealed a very minimal vegetable intake, some delayed meals, and some sweets - usually in response to getting over-hungry.  We reviewed the food records together, and determined the main areas of improvement involved making veg's happen as well as planning ahead.  Exercise continues to be excellent.  . BG continues to be well controlled.      Progress Towards Goal(s):  In progress.   Nutritional Diagnosis:  NB-2.1 Continued progress on Physical inactivity as related to back pain and deconditioning as evidenced by continued attendance in water aerobics class 6 X week, arthritis water class 2 X wk, and [employer] BELT class 3 X wk.  No further progress on NI-5.8.3 Inappropriate intake of types of carbohydrates (sugars) as related to sweets/desserts as evidenced by reported consumption of several sweets last week.    Intervention:  Nutrition counseling.  Monitoring/Evaluation:  Dietary intake, exercise, BG, and body weight in 2 weeks.

## 2012-01-10 NOTE — Patient Instructions (Addendum)
-   Great job doing the food record last week.   - Switch second shake of the day to post-exercise.   - Goals:  1. Vegetables at least twice a day.   2. PLAN AHEAD, especially for travel or on days you are running errands.    3. Start your recipe book.    4.  At least a 3-day food record; email [name].

## 2012-01-17 ENCOUNTER — Ambulatory Visit: Payer: 59 | Admitting: Family Medicine

## 2012-01-20 ENCOUNTER — Ambulatory Visit (INDEPENDENT_AMBULATORY_CARE_PROVIDER_SITE_OTHER): Payer: 59 | Admitting: Family Medicine

## 2012-01-20 ENCOUNTER — Encounter: Payer: Self-pay | Admitting: Family Medicine

## 2012-01-20 DIAGNOSIS — E119 Type 2 diabetes mellitus without complications: Secondary | ICD-10-CM

## 2012-01-20 NOTE — Patient Instructions (Addendum)
-   Personal Trainer Allene Dillon ([email or web address]):  [email or web address]; [phone].  - Building more muscle may help boost your metabolism.  Ask at the BELT program if you can mix up your routine some.    1. MORE veg's!  Plan some veg's for mid-day in addn to dinner.   2. Continue to plan ahead for meals:  At least every 24 hrs in advance. 3. Lunch options (all entrees to accompany a veg):  Lettuce, meat, cheese wraps; soups w/ added frozen veg's; cereal (w/ veg's as 1st course); crackers (Triscuits, Ak-Mak, Ryvita, or other whole-grain) & cheese/meat; cottage cheese & salad veg's (w/ dressing); beans with cooked veg's ([employer] Foods no-salt-added beans).   4. Record intake, and review daily:  Did I meet my goals of protein each meal & veg's 2 X day? 5. Explore frequency and quantity of artificial sweetener use:  Establish baseline.   6. Send 3-day food record to [facility].  - Oatmeal:  Try making with extra water, then add some All Bran cereal,  - 1/3 c powdered milk (plus some nuts/seeds/fruit & small amount of sweetener).   - Check out recipes on http://[email or web address].com/real-food-resources/recipe-index/

## 2012-01-20 NOTE — Progress Notes (Signed)
Medical Nutrition Therapy:  Appt start time: 1530 end time:  1630.  Assessment:  Primary concerns today: Weight management and Blood sugar control.  Marlyne is getting bored with her exercise and same foods.  She has a Technical brewer for MWF water exercise, and she is not challenged as much as she would like.  She lost track of the recommended website for meals and menus, so I provided that again today.  Eily also wonders if there is more she can do to help increase her RMR (to which I responded that building more muscle is likely to help).  BG remains in good control.    Progress Towards Goal(s):  In progress.   Nutritional Diagnosis:  NB-2.1 Continued progress on Physical inactivity as related to back pain and deconditioning as evidenced by continued attendance in water aerobics class 6 X week, arthritis water class 2 X wk, and [employer] BELT class 3 X wk.  Progress noted on NI-5.8.3 Inappropriate intake of types of carbohydrates (sugars) as related to sweets/desserts as evidenced by no reported consumption of sweets on food record emailed this week.     Intervention:  Nutrition counseling.  Monitoring/Evaluation:  Dietary intake, exercise, BG, and body weight in 2 weeks.

## 2012-01-24 ENCOUNTER — Ambulatory Visit: Payer: 59 | Admitting: Family Medicine

## 2012-01-27 ENCOUNTER — Ambulatory Visit (INDEPENDENT_AMBULATORY_CARE_PROVIDER_SITE_OTHER): Payer: Commercial Managed Care - PPO | Admitting: General Surgery

## 2012-01-27 DIAGNOSIS — Z6841 Body Mass Index (BMI) 40.0 and over, adult: Secondary | ICD-10-CM

## 2012-01-27 DIAGNOSIS — K439 Ventral hernia without obstruction or gangrene: Secondary | ICD-10-CM

## 2012-01-27 NOTE — Progress Notes (Signed)
History: Patient returns for routine followup with history of lap band placement December of 2008 as well as large multiply recurrent ventral hernia. She had minimal success with her lap band for several years but now that she is retired and is trying to get below 300 pounds and she can have her large hernia repaired in [place] she has dedicated herself and is actually having quite good success. She states she is depressed that her weight loss has plateaued but as below she is down another 15 pounds from her last visit at approaching her goal. In terms of restriction she still is filling up with an appropriate amount of food, having to chew a lot and eat very slowly with rare episodes of regurgitation  Exam: BP 134/78  Pulse 72  Temp 96.6 F (35.9 C) (Temporal)  Resp 18  Ht 5\' 8"  (1.727 m)  Wt 310 lb 9.6 oz (140.887 kg)  BMI 47.23 kg/m2 Total weight loss 62 pounds, 14 pounds from last visit  General: No distress, she clearly looks thinner in her face Abdomen: Again noted this very large diffuse ventral hernia but this is softer with her weight loss. No skin breakdown.  Assessment and plan: She is making good progress toward her goal to get below 300 pounds she can have her hernia fixed. I think her band is doing its job currently. We discussed other strategies regarding exercise and diet. She is working on an almost daily basis with her dietitian. She remains very motivated. She will return in 2-3 months.

## 2012-01-31 ENCOUNTER — Encounter: Payer: Self-pay | Admitting: Family Medicine

## 2012-01-31 ENCOUNTER — Ambulatory Visit (INDEPENDENT_AMBULATORY_CARE_PROVIDER_SITE_OTHER): Payer: Commercial Managed Care - PPO | Admitting: Family Medicine

## 2012-01-31 DIAGNOSIS — E119 Type 2 diabetes mellitus without complications: Secondary | ICD-10-CM

## 2012-01-31 NOTE — Progress Notes (Signed)
Medical Nutrition Therapy:  Appt start time: 1530 end time:  1630.  Assessment:  Primary concerns today: Weight management and Blood sugar control.  Grindle had two days of the state pharmacy mtg last week (where she was awarded the [employer] award), and although she is disappointed she didn't make better choices, she made mindful choices, and did much better than ever previously in a similar situation, i.e., took the cookie home, and had only a small piece she shared with her sister and brother-in-law.  Samina continues with her exercise routine.  She has been increasing her intensity on the elliptical at the BELT program MWF.  She plans to go to the [facility] to explore that workout center, still looking for a way to introduce some variety in her exercise routine.  Blood glucose remains in good control.    Progress Towards Goal(s):  In progress.   Nutritional Diagnosis:  NB-2.1 Continued progress on Physical inactivity as related to back pain and deconditioning as evidenced by continued attendance in water aerobics class 6 X week, arthritis water class 2 X wk, and [employer] BELT class 3 X wk.  Stable progress  noted on NI-5.8.3 Inappropriate intake of types of carbohydrates (sugars) as related to sweets/desserts as evidenced by consumption of sweets only 2 X last week.       Intervention:  Nutrition counseling.  Monitoring/Evaluation:  Dietary intake, exercise, BG, and body weight in 2 weeks.

## 2012-01-31 NOTE — Patient Instructions (Addendum)
-   Keep working on higher intensity workouts at [employer].   - Continue to build your recipe repetoir.  Create a shopping list accordingly, and try to keep most of these foods on hand so you can prepare most of the items most of the time.  Haze Rushing:  http://www.self-compassion.org/. - Daily food record.

## 2012-02-07 ENCOUNTER — Ambulatory Visit (INDEPENDENT_AMBULATORY_CARE_PROVIDER_SITE_OTHER): Payer: Commercial Managed Care - PPO | Admitting: Family Medicine

## 2012-02-07 ENCOUNTER — Encounter: Payer: Self-pay | Admitting: Family Medicine

## 2012-02-07 DIAGNOSIS — E119 Type 2 diabetes mellitus without complications: Secondary | ICD-10-CM

## 2012-02-07 NOTE — Progress Notes (Signed)
Medical Nutrition Therapy:  Appt start time: 1430 end time:  1530.  Assessment:  Primary concerns today: Weight management and Blood sugar control.  Thomas has significantly increased her intensity at the BELT program, but she said she has been hungry this week, and she has not been able to do her water aerobics b/c of the pool being closed last week.  She has been eating Halloween candy as well.  She did make some good choices at her last family reunion of the year last weekend, however.  Food records show veg's only once a day, and lunch was often more of a snack than a meal, i.e., protein bar or even just M&Ms one day.   We discussed what the obstacles might be to Beyounce's continuing to make good choices in her best interest.  Ladaysha looked at the self-compassion.org website, including taking the questionnaire and watching 3 of the short videos.  She was a bit surprised at the low level of self-compassion the test suggested she has.    Progress Towards Goal(s):  In progress.   Nutritional Diagnosis:  Regression on NB-2.1 Physical inactivity as related to back pain and deconditioning as evidenced by [employer] BELT class 3 X wk, but no water exercise classes this week (vs. usual of 6 X week & arthritis water class 2 X wk) .  Regression noted on NI-5.8.3 Inappropriate intake of types of carbohydrates (sugars) as related to sweets/desserts as evidenced by consumption of sweets multiple times last week.       Intervention:  Nutrition counseling.  Monitoring/Evaluation:  Dietary intake, exercise, BG, and body weight in 2 weeks.

## 2012-02-07 NOTE — Patient Instructions (Addendum)
-   Travel:  Keep your own food on hand, and in sight!  Before starting out, place your cash someplace outside of your purse.   - Quick, easy lunches:  Microwaved veg's and beans or soup; veg's with 1/2 pimiento cheese sandwich on whole wheat or pita or a whole-wheat wrap; cereal with fresh or frozen fruit.  - Self-compassion.org:  Write down a specific plan for how you will use some of what you learn at this site.   - Daily food record:  Bring or eamil to [place].

## 2012-02-14 ENCOUNTER — Ambulatory Visit (INDEPENDENT_AMBULATORY_CARE_PROVIDER_SITE_OTHER): Payer: Commercial Managed Care - PPO | Admitting: Family Medicine

## 2012-02-14 ENCOUNTER — Encounter: Payer: Self-pay | Admitting: Family Medicine

## 2012-02-14 DIAGNOSIS — E119 Type 2 diabetes mellitus without complications: Secondary | ICD-10-CM

## 2012-02-14 NOTE — Patient Instructions (Addendum)
-   Continue to experiment decreasing your Lantus, and tracking your BG, using Novolog as needed.   - New food routine:  6 AM - Shake  8 AM - coffee  11 AM - veg serving:  NON-starchy, and as low-fat seasoned as possible.   (Aim for progressively and slowly decreasing the amount of butter added to veg's [measure]).   - Shake  3 or 4 PM - (Fresh) fruit serving, followed by a shake.  6:30 or 7 PM - dinner (protein, minimal starch, veg). - Occasional substitution of small amount of cereal or other food at 11 AM or afternoon.   - Daily food record, and email [name] at least 3 days of food intake.    - Get back to the self-compassion site, and watch remaining videos.

## 2012-02-14 NOTE — Progress Notes (Signed)
Medical Nutrition Therapy:  Appt start time: 1430 end time:  1530.  Assessment:  Primary concerns today: Weight management and Blood sugar control.  Jeselle is even more frustrated that her weight has gone up slightly since last week.  She feels like she has been trying very hard at both diet and exercise, which is confirmed by food records emailed this week (most days kcal intake [phone]).   FBG have been running 90s to 105 most days.  Eshal again reports being very hungry last week, despite still taking Qsymia, which she will not refill when it runs out this week.    Progress Towards Goal(s):  In progress.   Nutritional Diagnosis:  Progress on NB-2.1 Physical inactivity as related to back pain and deconditioning as evidenced by back to usual routine of [employer] BELT class 3 X wk & water aerobic 3 X week & arthritis water class 2 X wk) .  Progress noted on NI-5.8.3 Inappropriate intake of types of carbohydrates (sugars) as related to sweets/desserts as evidenced by consumption of a sweet food only once last week, which was pudding, half artificially sweetened.        Intervention:  Nutrition counseling.  Monitoring/Evaluation:  Dietary intake, exercise, BG, and body weight in 1 week.

## 2012-02-21 ENCOUNTER — Encounter: Payer: Self-pay | Admitting: Family Medicine

## 2012-02-21 ENCOUNTER — Ambulatory Visit (INDEPENDENT_AMBULATORY_CARE_PROVIDER_SITE_OTHER): Payer: Commercial Managed Care - PPO | Admitting: Family Medicine

## 2012-02-21 DIAGNOSIS — E119 Type 2 diabetes mellitus without complications: Secondary | ICD-10-CM

## 2012-02-21 NOTE — Patient Instructions (Addendum)
-   Prepare [place] breast before you go.   - Thanksgiving:  1. Bring vegetables dishes.   2. Be sure you eat before you go, and bring a small snack for the ride there.    3. Look at everything, and decide what you most want.   - While at the beach:    - Walk daily.  - Daily food records.   - Mindful choices.    - Keep in mind:   Veg's and/or fruit at least twice a day.      Protein with each meal.     Water!!

## 2012-02-21 NOTE — Progress Notes (Signed)
Medical Nutrition Therapy:  Appt start time: 1430 end time:  1530.  Assessment:  Primary concerns today: Weight management and Blood sugar control.  Nastassja has discontinued her Lantus completely, and she took Novolog only 2 X last week, and BG levels have been well controlled.  Three-day food records suggest an intake of [phone] kcal, and veg's 2 X day on two of three days.  She said she has had urinary frequency, and since her BG is well controlled, she wonders if she may be somewhat ketotic.  She continues to increase her exercise intensity at [employer], and was challenged by the director last week to start using the stairs instead of the elevator.  She has handled the walking up fine, but feels some tenderness in her knee from the walking down, so will likely discontinue that.    Progress Towards Goal(s):  In progress.   Nutritional Diagnosis:  Progress on NB-2.1 Physical inactivity as related to back pain and deconditioning as evidenced by back to usual routine of [employer] BELT class 3 X wk & water aerobic 3 X week & arthritis water class 2 X wk) .  Progress noted on NI-5.8.3 Inappropriate intake of types of carbohydrates (sugars) as related to sweets/desserts as evidenced by consumption of no sweets last week.    Intervention:  Nutrition counseling.  Monitoring/Evaluation:  Dietary intake, exercise, BG, and body weight in 1 week.

## 2012-02-28 ENCOUNTER — Encounter: Payer: Self-pay | Admitting: Family Medicine

## 2012-02-28 ENCOUNTER — Ambulatory Visit (INDEPENDENT_AMBULATORY_CARE_PROVIDER_SITE_OTHER): Payer: Commercial Managed Care - PPO | Admitting: Family Medicine

## 2012-02-28 DIAGNOSIS — E119 Type 2 diabetes mellitus without complications: Secondary | ICD-10-CM

## 2012-02-28 NOTE — Patient Instructions (Addendum)
-   Try to find all your fasting BG's from the past two weeks (since 12/11).  - Continue to maintain current eating and exercise patterns.   - Goal:  Walk at least 15 min twice from Thursday thru Sunday.

## 2012-02-28 NOTE — Progress Notes (Signed)
Medical Nutrition Therapy:  Appt start time: 1430 end time:  1530.  Assessment:  Primary concerns today: Weight management and Blood sugar control.  [name] is still not taking any lantus, although her FBG have been 120's.  She has taken some Novolog about 3-4 X week as needed.  She continues to keep up the intensity at the BELT program MWF.   [name] said she has been getting veg's twice a day most days last week.  She plans to bring some vegetable dishes for Thanksgiving dinner at her in-laws.   24-hr recall:  (Up 11:10 AM) B (11:30 AM)-  Protein shake, 3/4 c All Bran, 1/2 c 1% milk Snk ( AM)-  none L ( PM)-  Protein shake, 1/2 banana Snk ( PM)-  none D (6:30 PM)-  1 oz steak, 1/3 c sweet potato, 1/2 c roasted squash, diet tea Snk (12 AM)-  Protein shake Yesterday was atypical due to over-sleeping in the AM.    Progress Towards Goal(s):  In progress.   Nutritional Diagnosis:  Progress on NB-2.1 Physical inactivity as related to back pain and deconditioning as evidenced by back to usual routine of [employer] BELT class 3 X wk & water aerobic 3 X week & arthritis water class 2 X wk) .  Progress noted on NI-5.8.3 Inappropriate intake of types of carbohydrates (sugars) as related to sweets/desserts as evidenced by consumption of no sweets last week.    Intervention:  Nutrition counseling.  Monitoring/Evaluation:  Dietary intake, exercise, BG, and body weight in 1 week.

## 2012-03-06 ENCOUNTER — Encounter: Payer: Self-pay | Admitting: Family Medicine

## 2012-03-06 ENCOUNTER — Ambulatory Visit (INDEPENDENT_AMBULATORY_CARE_PROVIDER_SITE_OTHER): Payer: Commercial Managed Care - PPO | Admitting: Family Medicine

## 2012-03-06 DIAGNOSIS — E119 Type 2 diabetes mellitus without complications: Secondary | ICD-10-CM

## 2012-03-06 NOTE — Patient Instructions (Addendum)
-   Have a plan for the next two weeks of when the [facility] is closed.    - Goals:  BELT MWF, and water exercise and/or 24/7 workout 5 X wk.     - Fallback:  Walk.   - Food goals:    1. Protein with each meal.    2. Veg's at least twice a day.    3. Food record daily.

## 2012-03-06 NOTE — Progress Notes (Signed)
Medical Nutrition Therapy:  Appt start time: 1430 end time:  1530.  Assessment:  Primary concerns today: Weight management and Blood sugar control.  [name] felt she did "ok" for Thanksgiving, given her choices.  She stayed with her sister, who did not have many options for other meals either.  In addn, she had a family funeral to attend on Friday, which included some family drama and tensions.  [name] recognized that a year ago, her response to such stressors would have been much different than now, and this is cause to feel good.  [name] did not walk much on the weekend, mostly b/c of cold and wind.   FBG has been running 118-130s.    Progress Towards Goal(s):  In progress.   Nutritional Diagnosis:  Progress on NB-2.1 Physical inactivity as related to back pain and deconditioning as evidenced by [employer] BELT class 3 X wk & water aerobic 3 X week & arthritis water class 2 X wk) .  Progress noted on NI-5.8.3 Inappropriate intake of types of carbohydrates (sugars) as related to sweets/desserts as evidenced by continued avoidance of sweets last week.    Intervention:  Nutrition counseling.  Monitoring/Evaluation:  Dietary intake, exercise, BG, and body weight in 2 weeks.

## 2012-03-17 ENCOUNTER — Ambulatory Visit (INDEPENDENT_AMBULATORY_CARE_PROVIDER_SITE_OTHER): Payer: Self-pay | Admitting: Family Medicine

## 2012-03-17 VITALS — BP 147/69 | HR 74 | Wt 299.0 lb

## 2012-03-17 DIAGNOSIS — E119 Type 2 diabetes mellitus without complications: Secondary | ICD-10-CM

## 2012-03-17 NOTE — Progress Notes (Signed)
Patient presents today for 3 month DM follow-up as part of the employer-sponsored [facility] to Well[facility] program. Medications, insulin regimen, and glucose readings have been reviewed. I have also discussed with patient lifestyle interventions such as diet and physical activity. Details of this visit can be found in [facility] documenting program through [facility]). Patient has set a series of personal goals and will follow-up in 3 months for further review of DM.  

## 2012-03-20 ENCOUNTER — Ambulatory Visit: Payer: Commercial Managed Care - PPO | Admitting: Family Medicine

## 2012-03-20 ENCOUNTER — Encounter: Payer: Self-pay | Admitting: Family Medicine

## 2012-03-20 ENCOUNTER — Ambulatory Visit (INDEPENDENT_AMBULATORY_CARE_PROVIDER_SITE_OTHER): Payer: Commercial Managed Care - PPO | Admitting: Family Medicine

## 2012-03-20 DIAGNOSIS — E119 Type 2 diabetes mellitus without complications: Secondary | ICD-10-CM

## 2012-03-20 NOTE — Progress Notes (Signed)
Medical Nutrition Therapy:  Appt start time: 1430 end time:  1530.  Assessment:  Primary concerns today: Weight management and Blood sugar control.  [name]'s FBG is 95-125, and bedtime BG is 100-140.  She is occasionally taking 5-12 U novolog, depending on her BG.  The [facility] is closed last week, so [name] has been worked out at [employer] 2 X last week.  She felt her food choices went well, although she is still finding M&Ms a challenge to avoid.  She has been more physically active in the course of her day.  She had veg's 2 X day ~3-4 days last week.  She did not keep a food record, but said she realizes how important this is to staying on track, so will start keeping one.  [name] saw Dr. Anda Kraft last week, and is hoping to get hernia surgery in March, which will go better with more weight by then.    Progress Towards Goal(s):  In progress.   Nutritional Diagnosis:  No progress on NB-2.1 Physical inactivity as related to back pain and deconditioning as evidenced by no water ex classes last wk .  No further progress noted on NI-5.8.3 Inappropriate intake of types of carbohydrates (sugars) as related to sweets/desserts as evidenced by choice of small bag of M&Ms twice last week.    Intervention:  Nutrition counseling.  Monitoring/Evaluation:  Dietary intake, exercise, BG, and body weight in 3 weeks.

## 2012-03-20 NOTE — Patient Instructions (Addendum)
-   Reminder:  Take Metformin in two doses rather than one.   - 10 weeks till Mar 3:  2.5 lb per week = 25 lb lost.   - Goals:   1. Daily food record.   2. Veg's twice a day.   3. Plan ahead carefully for your trip.    4. Some physical activity every day while away.     - Bring a raincoat and old shoes.   - AND:  Email [name] at least once in the next couple weeks to report progress:  - Are you keeping a food record?  - Veg's 2 X day?  - Limited sweets?  - Consider coming in for a weight check any time before your next appt.

## 2012-03-27 ENCOUNTER — Ambulatory Visit: Payer: 59

## 2012-03-27 ENCOUNTER — Ambulatory Visit: Payer: Commercial Managed Care - PPO | Admitting: Family Medicine

## 2012-04-06 ENCOUNTER — Encounter: Payer: Self-pay | Admitting: *Deleted

## 2012-04-10 ENCOUNTER — Ambulatory Visit (INDEPENDENT_AMBULATORY_CARE_PROVIDER_SITE_OTHER): Payer: Commercial Managed Care - PPO | Admitting: Family Medicine

## 2012-04-10 ENCOUNTER — Encounter: Payer: Self-pay | Admitting: Family Medicine

## 2012-04-10 DIAGNOSIS — E119 Type 2 diabetes mellitus without complications: Secondary | ICD-10-CM

## 2012-04-10 NOTE — Patient Instructions (Addendum)
-   Keep a good supply of frozen veg's on hand.   - Home-made soups will be a good source of veg's as well.   - Goals:  1. Veg's 2 X day.   2. Daily food record.    3. Review food record at the end of the day for how well you met your veg goal.

## 2012-04-10 NOTE — Progress Notes (Signed)
Medical Nutrition Therapy:  Appt start time: 1430 end time:  1530.  Assessment:  Primary concerns today: Weight management and Blood sugar control.  Taheera has been taking ~8 U Novolog with her evening meal, and her FBG has been 120s, and 135-150 before bed.  It has been very difficult for Tammela to resist some foods (especially sweets) if she sees others eating them.  She did not get veg's twice a day on most days, but was pleased she gained less than a pound over the holidays.  Last Saturday, however, Alba did water exercise for two hours, and today she started back at her usual BELT exercise program at [facility] and water exercise.  She has been reading Eating on the Wild Side, and is interested in trying new ways of preparing veg's, i.e., sauteeing, steaming, or roasting.    Progress Towards Goal(s):  In progress.   Nutritional Diagnosis:  No progress on NB-2.1 Physical inactivity as related to back pain and deconditioning as evidenced by no water ex classes last wk .  No further progress noted on NI-5.8.3 Inappropriate intake of types of carbohydrates (sugars) as related to sweets/desserts as evidenced by a number of sweets consumed over the holidays (although in small portions).    Intervention:  Nutrition counseling.  Monitoring/Evaluation:  Dietary intake, exercise, BG, and body weight in 1 week.

## 2012-04-13 ENCOUNTER — Ambulatory Visit (INDEPENDENT_AMBULATORY_CARE_PROVIDER_SITE_OTHER): Payer: Commercial Managed Care - PPO | Admitting: General Surgery

## 2012-04-13 ENCOUNTER — Encounter (INDEPENDENT_AMBULATORY_CARE_PROVIDER_SITE_OTHER): Payer: Self-pay | Admitting: General Surgery

## 2012-04-13 DIAGNOSIS — Z6841 Body Mass Index (BMI) 40.0 and over, adult: Secondary | ICD-10-CM

## 2012-04-13 NOTE — Progress Notes (Signed)
Chief complaint: Followup lap band and ventral hernia   History: Patient returns for followup. She continues to work hard at weight loss. She is experiencing appropriate restriction with her band. She has lost an additional 13 pounds and is now down to 298. This represents a total weight loss of 74 pounds. She has reached her goal set by Dr. Anda Kraft. She has seen him and plastic surgeon in [place] and arrangements are being made for her and a colectomy and hernia repair.  Patient was not examined today  Assessment and plan: Continued good weight loss. She absolutely needs her panniculectomy and hernia repair and this is now scheduled. She will keep me informed of her progress leading up to surgery and I will see her after she gets her hernia repaired.

## 2012-04-13 NOTE — Patient Instructions (Addendum)
Call to set up an appointment after your surgery

## 2012-04-17 ENCOUNTER — Ambulatory Visit (INDEPENDENT_AMBULATORY_CARE_PROVIDER_SITE_OTHER): Payer: Commercial Managed Care - PPO | Admitting: Family Medicine

## 2012-04-17 ENCOUNTER — Encounter: Payer: Self-pay | Admitting: Family Medicine

## 2012-04-17 ENCOUNTER — Encounter: Payer: Self-pay | Admitting: Internal Medicine

## 2012-04-17 DIAGNOSIS — E119 Type 2 diabetes mellitus without complications: Secondary | ICD-10-CM

## 2012-04-17 NOTE — Patient Instructions (Addendum)
-   Contact [name] at [facility]: [email or web address]; [phone] - Follow up with Rhetta Mura man who teaches cooking classes.   - Check with [employer] [phone]) to inquire re. classes. - Look up in the Duhigg book the appendix in which he demonstrates his use of the strategy to understand and change a habit.    - Make a plan about your M&M habit.    - Email your plan to [facility].  - Read the [facility] ppt slides, and also check out [email or web address]. - Goals remain the same:    - 3 meals a day  - Veg's 2 X day  - Protein with each meal

## 2012-04-17 NOTE — Progress Notes (Signed)
Medical Nutrition Therapy:  Appt start time: 1430 end time:  1530.  Assessment:  Primary concerns today: Weight management and Blood sugar control.  Tzipora went to [place] last week to see the Programmer, multimedia, which went well.  He told her she will have 4-6 Jackson-Pratt drains following herna repair & pannectomy surgery, which is a bit daunting.  Mariane has determined that she is getting bored with her current eating, which she believes contributes to her difficulties resisting hyper-palatable foods.  She has been eating M&Ms on most days.  She has also realized the frustration she is feeling that her husband and son refuse to start exercising or to take care of themselves.  This is causing tension in the household, and Hiawatha said she knows she needs to stop making suggestions, and just model her own healthy behaviors.   Chamaine's FBG has been running 115-120.    Progress Towards Goal(s):  In progress.   Nutritional Diagnosis:  No progress on NB-2.1 Physical inactivity as related to back pain and deconditioning as evidenced by no water ex classes last wk .  No further progress noted on NI-5.8.3 Inappropriate intake of types of carbohydrates (sugars) as related to sweets/desserts as evidenced by a number of sweets consumed over the holidays (although in small portions).    Intervention:  Nutrition counseling.  Monitoring/Evaluation:  Dietary intake, exercise, BG, and body weight in 1 week.

## 2012-04-24 ENCOUNTER — Ambulatory Visit (INDEPENDENT_AMBULATORY_CARE_PROVIDER_SITE_OTHER): Payer: Commercial Managed Care - PPO | Admitting: Family Medicine

## 2012-04-24 ENCOUNTER — Encounter: Payer: Self-pay | Admitting: Family Medicine

## 2012-04-24 DIAGNOSIS — E119 Type 2 diabetes mellitus without complications: Secondary | ICD-10-CM

## 2012-04-24 NOTE — Patient Instructions (Addendum)
-   Every moment is a choice moment.   - Write a synopsis of your plan, including specifics of what you know helps you make good choices.

## 2012-04-24 NOTE — Progress Notes (Signed)
Medical Nutrition Therapy:  Appt start time: 1430 end time:  1530.  Assessment:  Primary concerns today: Weight management and Blood sugar control.  Maycel was not surprised her wt was up b/c she has been eating M&Ms.  She believes some of her compulsion for the candy is related to rebelling - against all the rules she has been following for months now.  At the same time, she recognizes how foolish this is, and she said today that she is starting to feel ready to start making better choices.  Complicating Jemiah's feelings and decisions are her husband and son's increasing resistance to the meals she has been making, and to even the mention of any exercise for either of them.    Progress Towards Goal(s):  In progress.   Nutritional Diagnosis:  No progress on NB-2.1 Physical inactivity as related to back pain and deconditioning as evidenced by no water ex classes last wk .  No further progress noted on NI-5.8.3 Inappropriate intake of types of carbohydrates (sugars) as related to sweets/desserts as evidenced by continued consumption of M&Ms (~once daily).    Intervention:  Nutrition counseling.  Monitoring/Evaluation:  Dietary intake, exercise, BG, and body weight in 1 week.

## 2012-05-01 ENCOUNTER — Ambulatory Visit: Payer: Commercial Managed Care - PPO | Admitting: Family Medicine

## 2012-05-01 ENCOUNTER — Encounter: Payer: Self-pay | Admitting: Family Medicine

## 2012-05-01 NOTE — Progress Notes (Signed)
Patient ID: [name], female   DOB: 1950-10-16, 62 y.o.   MRN: [id number] Reviewed: Agree with documentation and management.

## 2012-05-08 ENCOUNTER — Ambulatory Visit (INDEPENDENT_AMBULATORY_CARE_PROVIDER_SITE_OTHER): Payer: Commercial Managed Care - PPO | Admitting: Family Medicine

## 2012-05-08 ENCOUNTER — Encounter: Payer: Self-pay | Admitting: Family Medicine

## 2012-05-08 DIAGNOSIS — E119 Type 2 diabetes mellitus without complications: Secondary | ICD-10-CM

## 2012-05-08 NOTE — Patient Instructions (Addendum)
-   Check your BP at home daily, and record.   - Write about:  How to get back in the game.  - Include how you see yourself now, and how you'd like to see yourself in 1 year and in 5 years.   - Read the 4 Questions article provided today.

## 2012-05-08 NOTE — Progress Notes (Signed)
Medical Nutrition Therapy:  Appt start time: 1430 end time:  1530.  Assessment:  Primary concerns today: Weight management and Blood sugar control.  Evalisse has "graduated" from the [employer] program to the [employer] program at [employer].  She went to her first H[employer] class today, and is not yet comfortable there, especially as much of the exercise hurt her knee that she fell on last Tuesday.  Annisa said she has made reasonable food choices, but has perhaps eaten more than she should have on some days.  She had M&Ms only two times since her appt 2 wks ago.  Weight gain seen today is likely related mainly to lack of physical activity for several days following her knee injury.  We talked about the M&Ms as not really being the issue, but that they represent Mikinzie's assertion of control.  I also suggested to her that her wt loss stopped soon after making her goal of getting under 300 lb, and then facing the reality of the pending hernia surgery that frightens her immensely.  In addition, I challenged Marriah to try to shift the paradigm of this whole process being about wt loss so she is a candidate for surgery to a vision of her losing weight to feel good and to be successful in pursuing whatever she wants to do physically.     Progress Towards Goal(s):  In progress.   Nutritional Diagnosis:  No progress on NB-2.1 Physical inactivity as related to back pain and deconditioning as evidenced by no water ex classes last wk .  No further progress noted on NI-5.8.3 Inappropriate intake of types of carbohydrates (sugars) as related to sweets/desserts as evidenced by continued consumption of M&Ms (~once daily).    Intervention:  Nutrition counseling.  Monitoring/Evaluation:  Dietary intake, exercise, BG, and body weight in 2 weeks.

## 2012-05-15 ENCOUNTER — Ambulatory Visit: Payer: Commercial Managed Care - PPO | Admitting: Family Medicine

## 2012-05-22 ENCOUNTER — Ambulatory Visit (INDEPENDENT_AMBULATORY_CARE_PROVIDER_SITE_OTHER): Payer: Commercial Managed Care - PPO | Admitting: Family Medicine

## 2012-05-22 ENCOUNTER — Encounter: Payer: Self-pay | Admitting: Family Medicine

## 2012-05-22 DIAGNOSIS — E119 Type 2 diabetes mellitus without complications: Secondary | ICD-10-CM

## 2012-05-22 NOTE — Progress Notes (Signed)
Medical Nutrition Therapy:  Appt start time: 1430 end time:  1530.  Assessment:  Primary concerns today: Weight management and Blood sugar control.  [name] lost 8 lb in the last two weeks.  She thinks her hair is thinning (due to inadequate protein), so she has started to pay more attn to get her protein (using shakes more frequently).  BP checks at home have been 116-120s systolic and and 16-10 diastolic.  She is concerned re. the pulse pressure being far apart, but no physician has seemed concerned.  [name] is struggling right now - feeling in limbo w/ respect to focus and purpose, as well as now needing to fight for coverage for her surgery (penectomy).  Yesterday's food intake reflects her level of frustration:  B ( AM)-  Protein shake X 2 (early & pre-church) Snk ( AM)-   L (3 PM)-  Bag of M&Ms Snk ( PM)-   D ( PM)-  [employer] Meximelt (soft taco w/ hmbgr & chs), Splenda tea Snk ( PM)-    Progress Towards Goal(s):  In progress.   Nutritional Diagnosis:  Progress noted on NB-2.1 Physical inactivity as related to deconditioning as evidenced by back to regular exercise routine of BELT classes & water ex.  No further progress noted on NI-5.8.3 Inappropriate intake of types of carbohydrates (sugars) as related to sweets/desserts as evidenced by continued consumption of M&Ms (~once daily).    Intervention:  Nutrition counseling.  Monitoring/Evaluation:  Dietary intake, exercise, BG, and body weight in 1 week.

## 2012-05-22 NOTE — Patient Instructions (Addendum)
-   Make sure you get at least 2 oz of a protein food at BOTH lunch and dinner.   - Carbohydrate:  Limit to 30 grams per meal and 15 grams per snack.   - Write down what you eat for breakfast, how much, and what time.  Then also record the time at which you notice hunger.  Also note any use of Novolog.   -GOALS:  - Wt loss goal of 100 lb from start.  - Track carb's.  - Find an alternative pool for the 2 wks [facility] is closed.    - Protein with each meal and snack.

## 2012-05-26 ENCOUNTER — Ambulatory Visit (INDEPENDENT_AMBULATORY_CARE_PROVIDER_SITE_OTHER): Payer: 59 | Admitting: Internal Medicine

## 2012-05-26 ENCOUNTER — Encounter: Payer: Self-pay | Admitting: Internal Medicine

## 2012-05-26 VITALS — BP 136/72 | HR 70 | Ht 68.0 in | Wt 305.4 lb

## 2012-05-26 DIAGNOSIS — J309 Allergic rhinitis, unspecified: Secondary | ICD-10-CM

## 2012-05-26 DIAGNOSIS — J300 Vasomotor rhinitis: Secondary | ICD-10-CM

## 2012-05-26 DIAGNOSIS — G4733 Obstructive sleep apnea (adult) (pediatric): Secondary | ICD-10-CM

## 2012-05-26 MED ORDER — AZELASTINE-FLUTICASONE 137-50 MCG/ACT NA SUSP: 2.0000 | Freq: Every day | NASAL | Status: DC

## 2012-05-26 NOTE — Patient Instructions (Addendum)
We can continue CPAP 12/ Advanced  Consider trying the CPAP humidifier and perhaps a saline rinse- Neti pot or squeeze bottle, to see if it helps your nose  Sample Dymista nasal spray-    1-2 puffs each nostril once daily at bedtime  Please call as needed

## 2012-05-26 NOTE — Progress Notes (Signed)
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 Has noticed she gets sleepy during the day(in hot room). She remains fully compliant with CPAP 12 CWP/Advanced, nasal mask. She has had lap band surgery and is working on weight loss so that she can have abdominal hernia repairs by a Careers adviser in [place]. She is planning to retire so that she can devote full attention to her weight loss effort. 2 cups of coffee in the morning. Admits sleepiness sitting in warm room.  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  Using CPAP reliably. No special needs expressed. Has not been able to lose weight Does complain of perennial mucoid rhinorrhea which is bothersome, without sneeze or headache.  ROS-see HPI Constitutional:   Limited  weight loss, No- night sweats, fevers, chills, fatigue, lassitude. HEENT:   No-  headaches, difficulty swallowing, tooth/dental problems, sore throat,       No-  sneezing, itching, ear ache, nasal congestion, +post nasal drip,  CV:  No-   chest pain, orthopnea, PND, swelling in lower extremities, anasarca,  dizziness, palpitations Resp: No- acute  shortness of breath with exertion or at rest.              No-   productive cough,  No non-productive cough,  No- coughing up of blood.              No-   change in color of mucus.  No- wheezing.   Skin: No-   rash or lesions. GI:  No-   heartburn, indigestion, abdominal pain, nausea, vomiting,  GU: MS:  No-   joint pain or swelling.   Neuro-     nothing unusual Psych:  No- change in mood or affect. No depression or anxiety.  No memory loss.  OBJ- Physical Exam General- Alert, Oriented, Affect-appropriate, Distress- none acute, morbid obesity Skin- rash-none, lesions-  none, excoriation- none Lymphadenopathy- none Head- atraumatic            Eyes- Gross vision intact, PERRLA, conjunctivae and secretions clear            Ears- Hearing, canals-normal            Nose- Clear, no-Septal dev, + clear mucus bridging, No-polyps, erosion, perforation             Throat- Mallampati IV , mucosa clear , drainage- none, tonsils- atrophic Neck- flexible , trachea midline, no stridor , thyroid nl, carotid no bruit Chest - symmetrical excursion , unlabored           Heart/CV- RRR , 1/6 AS murmur , no gallop  , no rub, nl s1 s2                           - JVD- none , edema- none, stasis changes- none, varices- none           Lung- clear to P&A, wheeze- none, cough- none , dullness-none, rub- none           Chest wall-  Abd-  Br/ Gen/ Rectal- Not done, not indicated Extrem- cyanosis- none, clubbing, none, atrophy- none, strength- nl Neuro- grossly intact to observation

## 2012-05-29 ENCOUNTER — Encounter: Payer: Self-pay | Admitting: Family Medicine

## 2012-05-29 ENCOUNTER — Ambulatory Visit (INDEPENDENT_AMBULATORY_CARE_PROVIDER_SITE_OTHER): Payer: 59 | Admitting: Family Medicine

## 2012-05-29 DIAGNOSIS — J300 Vasomotor rhinitis: Secondary | ICD-10-CM | POA: Insufficient documentation

## 2012-05-29 DIAGNOSIS — E119 Type 2 diabetes mellitus without complications: Secondary | ICD-10-CM

## 2012-05-29 NOTE — Assessment & Plan Note (Signed)
Good compliance and control. Weight loss would help if she can ever achieve it.

## 2012-05-29 NOTE — Progress Notes (Signed)
Medical Nutrition Therapy:  Appt start time: 1430 end time:  1530.  Assessment:  Primary concerns today: Weight management and Blood sugar control.  [name] did not do her water exercise last week b/c of the [facility] being closed.  She also felt she ate more last week, including a sandwich yesterday at her sister's, where she has realized she usually over-eats.  She had baked cupcakes for her sister's birthday, and did not eat one, but did have some of the pie her sister offered.  She had just one package of M&Ms.  She has become even more frustrated over problems with insurance coverage for her penectomy.  Frustration and negative emotional states are challenging for [name] to navigate without resorting to food for comfort.    Progress Towards Goal(s):  In progress.   Nutritional Diagnosis:  Some regression on NB-2.1 Physical inactivity as related to deconditioning as evidenced by reduced frequency of water ex.  No progress noted on NI-5.8.3 Inappropriate intake of types of carbohydrates (sugars) as related to sweets/desserts as evidenced by M&Ms consumption down to once/wk, but other desserts consumed.    Intervention:  Nutrition counseling.  Monitoring/Evaluation:  Dietary intake, exercise, BG, and body weight in 1 week.

## 2012-05-29 NOTE — Patient Instructions (Addendum)
-   When you go to your sister's next time, PLAN ahead for meals & snacks.    - Bring foods you may need.    - This may provide the opportunity for a discussion about your needs.   - Record your food intake daily.    - Check different tracking apps.   - Food goals remain the same.

## 2012-05-29 NOTE — Assessment & Plan Note (Signed)
Pattern she describes does not suggest allergy. Plan-saline rinse with squeeze bottle or Neti pot. Sample Dymista for trial

## 2012-06-05 ENCOUNTER — Ambulatory Visit: Payer: Commercial Managed Care - PPO | Admitting: Family Medicine

## 2012-06-12 ENCOUNTER — Ambulatory Visit (INDEPENDENT_AMBULATORY_CARE_PROVIDER_SITE_OTHER): Payer: 59 | Admitting: Family Medicine

## 2012-06-12 DIAGNOSIS — E119 Type 2 diabetes mellitus without complications: Secondary | ICD-10-CM

## 2012-06-12 NOTE — Progress Notes (Signed)
Medical Nutrition Therapy:  Appt start time: 1430 end time:  1530.  Assessment:  Primary concerns today: Weight management and Blood sugar control.  [name]'s Paediatric nurse at the [employer] is on spring break, but she can still go to the class on her own.  She may not be able to do her afternoon water exercise b/c of the parking problems related to [facility] basketball.  She said she is feeling more ready to consistently get back to her efforts at weight loss.    Progress Towards Goal(s):  In progress.   Nutritional Diagnosis:  Some progress on NB-2.1 Physical inactivity as related to deconditioning as evidenced by back to normal frequency of water aerobics (w/ exception of power 2 outage days last week).  Slight progress noted on NI-5.8.3 Inappropriate intake of types of carbohydrates (sugars) as related to sweets/desserts as evidenced by M&Ms consumption down to once/wk, but other desserts consumed.    Intervention:  Nutrition counseling.  Monitoring/Evaluation:  Dietary intake, exercise, BG, and body weight in 1 week.

## 2012-06-12 NOTE — Patient Instructions (Addendum)
-   Respond to emailed comments on your writing about getting back in the game.  - GOALS:  1. Food record - using app or written.  2. Some form of physical activity daily.   3. Vegetables twice a day.

## 2012-06-16 ENCOUNTER — Ambulatory Visit (INDEPENDENT_AMBULATORY_CARE_PROVIDER_SITE_OTHER): Payer: Self-pay | Admitting: Family Medicine

## 2012-06-16 VITALS — BP 136/81 | HR 70 | Ht 68.0 in | Wt 300.0 lb

## 2012-06-16 DIAGNOSIS — E119 Type 2 diabetes mellitus without complications: Secondary | ICD-10-CM

## 2012-06-16 NOTE — Progress Notes (Signed)
Patient presents today for 3 month DM follow-up as part of the employer-sponsored [employer] program.   Subjective: She reports that she had an episode when she fainted, fell and hurt her knee. She had difficulty walking following her fall because of the pain in her knee. This prevented her from exercising as much as she was previously. She also thought that she fainted because she wasn't eating enough carbs. She has increased the amount of carbs she is eating during the day and she hasn't lost as much weight as a result of this. She is having a hard time getting her 2 vegetables in during the day. She is also worried that she is not getting enough protein in her diet. She wants to have her albumin level drawn by Dr. Evlyn Kanner.  Physical Activity- [facility]. She has a Paediatric nurse for 75 minutes three days a week. Her trainer wants to increase this to 90 minutes. She has a twice a week 60 minute high impact water aerobics. She often will do an additional 30 mintues of walking in the pool. She has had a hard time getting into the pool in the evenings because of scheduling conflicts with the pool and tournaments. She averages three afternoon pool classes during the week.   Nutrition- seeing Wyona Almas, RD for dietary management. She reports that she has not been doing as well, especially now that she is eating M&Ms again. She is still eating a lot of soups, but reports that she has increased her carbohydrate intake lately.   Preventive Care:  Hemoglobin A1c: 06/16/2012  Dilated Eye Exam: 04/05/2012  Foot Exam: 04/10/2012  Disease Assessments:  Diabetes: Type of Diabetes: Type 2; Year of diagnosis 2003; Sees Diabetes provider 3 times per year; MD managing Diabetes Dr. 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 When I downloaded her meter, she was surprised to see that her average CBG had increased to around 150 and she wanted me to retest her A1C today in the office. It was 6.1%, so it is essentially unchanged since her last visit with Dr. Evlyn Kanner. Her CBGs are generally higher in the evening and gradually trend higher as the day progresses. I believe this rise in average CBG coincides with her eating more carbohydrates.   She had one episode of hypoglycemia- it was down to 76. She reports that she was exercising heavily that day and she didn't eat enough.   She is checking a fasting blood sugar and then again around 10 PM (2.5 hours post prandial). She is no longer taking Lantus insulin and is using a sliding scale Novolog dose before breakfast and dinner only. This dose could be anywhere from zero units up to 15 units. She is not checking her blood sugar prior to eating dinner, so she is not correcting for any elevated blood sugar.   Highest CBG (220) was when she ate a baked potato and didn't cover with insulin. I explained to her that this type of food would likely always raise her CBG and that she needed to cover it with Novolog.   DM Assessment/Plan:  From looking back at patient's  visits with Danie Chandler, she has gained around 10 pounds since her last appointment with me, but has since lost this weight and is again at 300 pounds. In January she received notice that her hernia repair surgery with Dr. Anda Kraft with [facility] had been denied. She reports that this deepened her depression and she fell off track with her lifestyle changes. She appealed this denial and the hernia repair surgery along with the panniculectomy has been approved. She is estatic over this new development and is hoping to have the surgery later this spring. She seems more  motivated to stick with her eating plan and to continue physical activity. Overall she has lost around 75 pounds since her first visit with me at the beginning of 2013. A1C today was 6.1% and is at goal of less than 7%. After downloading her meter today we discussed the importance of checking her blood sugar prior to eating dinner so that she can correct for any existing elevation and also cover for the carbohydrates she is eating. She agreed to move her evening CBG check prior to dinner. Her highest CBG in the past month was 220 and she states that was following eating a baked potato. I encouraged patient to continue making healthy eating choices and to stop buying M&Ms. I also encouraged her to continue her physical activity regimen. She really wants to start the [employer], but doesn't think she will be able to do it until after her hernia repair. I encouraged her to make [employer] a long term goal and that hopefully after the surgery she will be able to start it.   I will fax A1C results to Dr. Rinaldo Cloud office. Follow up with patient in 3 months.   Goals for Next Visit-  1. Weight loss- 15 pounds.  2. Great job with your A1C today- 6.1%! 3. Check your blood sugar before dinner rather than after. Cover with Novolog. Correct for any hyperglycemia and also cover for the carbs that you are eating.  4. Continue physical activity.  5. Long term goal of getting to do [employer].  Next appointment is June 23rd at 11 AM.  Ellender Hose. Vivia Ewing, PharmD, BCPS

## 2012-06-19 ENCOUNTER — Ambulatory Visit (INDEPENDENT_AMBULATORY_CARE_PROVIDER_SITE_OTHER): Payer: 59 | Admitting: Family Medicine

## 2012-06-19 ENCOUNTER — Encounter: Payer: Self-pay | Admitting: Family Medicine

## 2012-06-19 DIAGNOSIS — E119 Type 2 diabetes mellitus without complications: Secondary | ICD-10-CM

## 2012-06-19 NOTE — Patient Instructions (Addendum)
-   Any time you are out for the day:  Bring food and/or be mindful of time.  Be sure to eat on time to keep your BG stable and your appetite controlled.   - GOAL for next week:  Daily food record, which you review at the end of each day.   - After reading the [facility] handout, write down at least three things you can engineer in your environment to better control food choices.

## 2012-06-19 NOTE — Progress Notes (Signed)
Medical Nutrition Therapy:  Appt start time: 1430 end time:  1530.  Assessment:  Primary concerns today: Weight management and Blood sugar control.  [name] was surprised to not see wt loss b/c she felt she'd been working hard on her food choices.  She did get plenty of exercise last week.  She had lunch with her sister on Sat, and they had not planned well in advance, so did not have lunch until 3 PM, by which time [name] was starting to feel hypoglycemic.  Predictably, she made some food choices that were less than optimal, but did not eat the whole meal.  [name] kept a food record on some days with [employer], which she is still learning.  She did eat veg's 2 X day on most days.   FBG has been 128-132.  Bedtime BG has been up to the 150s (one 205).   Did not do a 24-hr recall b/c yesterday was completely abnormal partly b/c [name] and her family got home from her sister's after midnight Sat, and she slept very late Sunday.    Progress Towards Goal(s):  In progress.   Nutritional Diagnosis:  Good progress on NB-2.1 Physical inactivity as related to deconditioning as evidenced by back to normal frequency of water aerobics.  Slight progress noted on NI-5.8.3 Inappropriate intake of types of carbohydrates (sugars) as related to sweets/desserts as evidenced no M&Ms.    Intervention:  Nutrition counseling.  Monitoring/Evaluation:  Dietary intake, exercise, BG, and body weight in 1 week.

## 2012-06-26 ENCOUNTER — Encounter: Payer: Self-pay | Admitting: Family Medicine

## 2012-06-26 ENCOUNTER — Ambulatory Visit (INDEPENDENT_AMBULATORY_CARE_PROVIDER_SITE_OTHER): Payer: 59 | Admitting: Family Medicine

## 2012-06-26 DIAGNOSIS — E119 Type 2 diabetes mellitus without complications: Secondary | ICD-10-CM

## 2012-06-26 NOTE — Patient Instructions (Addendum)
-   Foods to bring to [facility]:  Fruit, nuts, seeds, string cheese, yogurt, carrots, lunch for Thursday. - Plans for in [place]:  Exercise at end of the day.    - Remember the 5-minute rule.   - Take the quiz at Self-compassion.org, and record your scores, including sub-scores.   - Daily food record; include physical activity; review each night.   - Use some of your down time in [place] to write some strategies for those "danger zones" you might encounter.

## 2012-06-26 NOTE — Progress Notes (Signed)
Medical Nutrition Therapy:  Appt start time: 1430 end time:  1530.  Assessment:  Primary concerns today: Weight management and Blood sugar control.  [name] has been working at keeping her BG lower, eating more veg's. She said she has felt hungrier, and her portions have been bigger, but she is not sure why.  She'll be attending a pharmacy mtg in [place] next week (at which she is receiving an award Sunday night), so will be at meetings from 6 AM till late afternoon Fri through Monday.  Getting exercise and eating moderately will be especially challenging.    FBG has been 109-126, pre-supper has been ~115-120, and bedtime has been 140s.  24-hr recall:  (UP at 11:30 AM) B (12 PM)-  EAS shake (100 kcal), 1 fried egg (spray oil), 12 oz 1% milk Snk ( AM)-  none L (4:30 PM)-  none Snk ( PM)-  EAS Shake (100) D (7 PM)-  3 c cabbage-okra soup w/ hambgr meat Snk ( PM)-  EAS Shake (100) [name] slept late again yesterday b/c of getting home at midnight Sat night (again visiting relatives in [place]).    Progress Towards Goal(s):  In progress.   Nutritional Diagnosis:  Stable progress on NB-2.1 Physical inactivity as related to deconditioning as evidenced by back to normal frequency of water aerobics.  Stable progress noted on NI-5.8.3 Inappropriate intake of types of carbohydrates (sugars) as related to sweets/desserts as evidenced no M&Ms.    Intervention:  Nutrition counseling.  Monitoring/Evaluation:  Dietary intake, exercise, BG, and body weight in 1 week.

## 2012-07-04 NOTE — Progress Notes (Signed)
Patient ID: [name], female   DOB: [birth year], 62 y.o.   MRN: [id number] ATTENDING PHYSICIAN NOTE: I have reviewed the chart and agree with the plan as detailed above. Denny Levy MD Pager [phone]

## 2012-07-10 ENCOUNTER — Ambulatory Visit: Payer: 59 | Admitting: Family Medicine

## 2012-07-17 ENCOUNTER — Ambulatory Visit: Payer: 59 | Admitting: Family Medicine

## 2012-07-24 ENCOUNTER — Ambulatory Visit: Payer: 59 | Admitting: Family Medicine

## 2012-07-31 ENCOUNTER — Ambulatory Visit (INDEPENDENT_AMBULATORY_CARE_PROVIDER_SITE_OTHER): Payer: 59 | Admitting: Family Medicine

## 2012-07-31 ENCOUNTER — Encounter: Payer: Self-pay | Admitting: Family Medicine

## 2012-07-31 ENCOUNTER — Ambulatory Visit: Payer: 59 | Admitting: Family Medicine

## 2012-07-31 DIAGNOSIS — E119 Type 2 diabetes mellitus without complications: Secondary | ICD-10-CM

## 2012-07-31 NOTE — Patient Instructions (Addendum)
-   Looking ahead at the next 8 weeks:  Figure out your likely "danger zones" and develop some scripts for navigating those situations.   - Continue your same food and exercise efforts.   - Once you start the [employer], dinner NEEDS TO include plenty of veg's and protein.

## 2012-07-31 NOTE — Progress Notes (Signed)
Medical Nutrition Therapy:  Appt start time: 1430 end time:  1530.  Assessment:  Primary concerns today: Weight management and Blood sugar control.  [name]'s surgeon down in [place], Dr. Anda Kraft, said her hernias have shifted, so he wants her to lose 20 more lb before her next surgery date of June 26.  He wants [name] to [employer], which she has even had trouble finding, but finally did find 2 cases on [email or web address].    24-hr recall:  (UP at  3 PM) B (3:30 AM)-  Protein shake  Snk ( AM)-  none L (4 PM)-  1 1/2 c Cheerios w/ 6 oz fat-free milk Snk ( PM)-  none D (8:30 PM)-  1 beef enchilada, diet tea, 15-20 chips Snk (1:30)-  Protein shake Santiago got [place] 1:30 AM [place] which [place] how late she slept, and that her eating was thrown off yesterday.  She has otherwise made more prudent choices.    Progress Towards Goal(s):  In progress.   Nutritional Diagnosis:  Good progress on NB-2.1 Physical inactivity as related to deconditioning as evidenced by back to normal frequency of water aerobics.  Good progress noted on NI-5.8.3 Inappropriate intake of types of carbohydrates (sugars) as related to sweets/desserts as evidenced no M&Ms.    Intervention:  Nutrition counseling.  Monitoring/Evaluation:  Dietary intake, exercise, BG, and body weight in 1 week.

## 2012-08-07 ENCOUNTER — Ambulatory Visit: Payer: 59 | Admitting: Family Medicine

## 2012-08-07 ENCOUNTER — Ambulatory Visit (INDEPENDENT_AMBULATORY_CARE_PROVIDER_SITE_OTHER): Payer: 59 | Admitting: Family Medicine

## 2012-08-07 DIAGNOSIS — E119 Type 2 diabetes mellitus without complications: Secondary | ICD-10-CM

## 2012-08-07 NOTE — Progress Notes (Signed)
Medical Nutrition Therapy:  Appt start time: 1415 end time:  1500.  Assessment:  Primary concerns today: Weight management and Blood sugar control.  [name] started using Optifast Saturday; has taken 2-3 of the Optifast boxes per day since then.  She has been very careful in her food choices, and concerned that her BG may be higher b/c of the additional CHO in the Optifast.  On Friday evening, [name]'s husband called to say he would not be home for dinner, so [name] did not cook for herself, but instead ate vanilla wafers intermittently through the evening.  We talked about the drivers of this choice being (1) hunger; (2) the end of a hard week in which she had made very careful, prudent food choices; and (3) they taste good.  The first of these three is the easiest to deal with, i.e., next time in a similar situation, plan and eat a REAL meal w/ veg's and protein source, then allow a portion of cookies if still interested.  Reminded [name] of 3 Qs of a good food decision.   Regardless of cookies one day, [name]'s weight was down today almost 6 lb, so she is clearly limiting intake and exercising well.    Optifast 800: 160 kcal; 3 g fat; 20 g CHO; 14 g protein; suggested exchanges 1 lean meat, 1 lean milk Advantedge:  110 kcal; 2.5 g fat; 5 g CHO; 17 g protein  Progress Towards Goal(s):  In progress.   Nutritional Diagnosis:  Continued progress on NB-2.1 Physical inactivity as related to deconditioning as evidenced by back to normal frequency of water aerobics.  Regression on NI-5.8.3 Inappropriate intake of types of carbohydrates (sugars) as related to sweets/desserts as evidenced by lots of vanilla wafers one day last week.    Intervention:  Nutrition counseling.  Monitoring/Evaluation:  Dietary intake, exercise, BG, and body weight in 1 week.

## 2012-08-07 NOTE — Patient Instructions (Addendum)
-   Next time you go to a [place], experiment with putting the chips at the far end of the table. - Continue to check BG, especially fasting, and record in your book. - The 3 Qs of a good food decision:  1. How hungry am I?  2. What am I in the mood for?  3. What is good for me? - Consider what DANGER ZONES you may encounter, and figure out strategies for dealing with them.   - [employer] your strategies.   - Keep doing all you are doing now with your exercise.

## 2012-08-14 ENCOUNTER — Ambulatory Visit: Payer: 59 | Admitting: Family Medicine

## 2012-08-22 ENCOUNTER — Ambulatory Visit (INDEPENDENT_AMBULATORY_CARE_PROVIDER_SITE_OTHER): Payer: 59 | Admitting: Family Medicine

## 2012-08-22 ENCOUNTER — Encounter: Payer: Self-pay | Admitting: Family Medicine

## 2012-08-22 DIAGNOSIS — E119 Type 2 diabetes mellitus without complications: Secondary | ICD-10-CM

## 2012-08-22 NOTE — Progress Notes (Signed)
Medical Nutrition Therapy:  Appt start time: 1415 end time:  1500.  Assessment:  Primary concerns today: Weight management and Blood sugar control.  [name] felt very frustrated that her weight was up 2 lb today.  She has been using the Optifast for breakfast and lunch, and is eating pretty normal dinners, but limited in quantity.  She feels tired and lethargic, but she is still doing her exercise programs.   One difference, which may explain the apparent weight gain, is that [name] has been so tired that she has been much less active during the day, even sleeping a lot, especially yesterday.  I suspect she has not been in ketosis b/c of carb intake outside of her Optifast drinks.  We agreed that [name]'s choice of corn pudding (creamed corn, kernel corn, sour cream, and corn muffin mix) was excessively high in both CHO and energy, resulting in a FBG this AM of 140. FBG has been 80-124. She is using 0-22 units novolog at dinner time, and sometimes 5 units in the AM.    24-hr recall suggests intake of ~1115 kcal:  (UP at 6 AM) B ( AM)-  2 Advant shakes   (220) BELT program exercise B ( AM)-  Diet tea 10:15  To bed till 1:15 (felt hungry & tired) Snk ( AM)-  none L (1:30 PM)-  2 c Cheerios, 1 c 1% milk (305) 2 PM   Fell asleep til 5 PM Snk ( PM)-  Advant shake   (110) D (7:30 PM)-  4 bites chx, 1 c corn pudding  (500) Snk ( PM)-  none  Optifast 800: 160 kcal; 3 g fat; 20 g CHO; 14 g protein; suggested exchanges 1 lean meat, 1 lean milk Advantedge:  110 kcal; 2.5 g fat; 5 g CHO; 17 g protein  Progress Towards Goal(s):  In progress.   Nutritional Diagnosis:  Stable progress on NB-2.1 Physical inactivity as related to deconditioning as evidenced by back to normal frequency of water aerobics.  Stable progress on NI-5.8.3 Inappropriate intake of types of carbohydrates (sugars) as related to sweets/desserts as evidenced by complying with Optifast regimen, but with intermittent CHO at third meal and  snacks.    Intervention:  Nutrition counseling.  Monitoring/Evaluation:  Dietary intake, exercise, BG, and body weight in 1 week.

## 2012-08-22 NOTE — Patient Instructions (Addendum)
-   Limit carb's more stringently; no starchy foods (milk is ok). - Continue to monitor blood sugar levels b/c you will probably need to adjust insulin accordingly.  - Eat as many nonstarchy veg's as you want.  - Protein with each meal.  Try some ground chicken as well as ground [place] or beef.

## 2012-08-29 ENCOUNTER — Ambulatory Visit (INDEPENDENT_AMBULATORY_CARE_PROVIDER_SITE_OTHER): Payer: 59 | Admitting: Family Medicine

## 2012-08-29 ENCOUNTER — Encounter: Payer: Self-pay | Admitting: Family Medicine

## 2012-08-29 DIAGNOSIS — E119 Type 2 diabetes mellitus without complications: Secondary | ICD-10-CM

## 2012-08-29 NOTE — Patient Instructions (Addendum)
-   The  Movie, "Fed Up" at [employer], [employer].  - This week's focus:  Write down food intake.   - Daily food goals:  - Optifast shake & water before AM exercise.   - Advantage shake (+/-) & more water or unsweetened post-exercise.    - Lunch: Other Optifast.  - Afternoon:  Water or unsweetened tea; vegetables, if hungry  - Dinner:  Lots of veg's; meat/other protein source. (Reminder: ground meat)  - After dinner:  Advantage shake (+/-) - Plan ahead for afternoon veg's.   - Aim for 10 PM bedtime.

## 2012-08-29 NOTE — Progress Notes (Signed)
Medical Nutrition Therapy:  Appt start time: 1200 end time:  1230.  Assessment:  Primary concerns today: Weight management and Blood sugar control.  [name] spent 4 days at [employer], which probably explains her slight weight gain.  Her routine was brkfst of a shake before going to the beach ~10 AM, sometimes a bit of Cheerios.  She did some pool walking 60-90 min while there.  She had a shake again for lunch ~3:30, another shake ~6:30 PM, then dinner at a restaurant ~8 PM.  They had pizza the first night, along w/ a salad.  [name] has sometimes had M&Ms at least once a week, but she has at least been buying the smallest pkg available.   FBG have been 107-120.  She has been using at most 5 U Novolog at dinner.  She feels a lot better since the beach trip; not feeling dizzy as she was before the beach trip, seemingly b/c she has consumed more kcal daily.  The dizziness has actually been a serious problem for her.     Optifast 800: 160 kcal; 3 g fat; 20 g CHO; 14 g protein; suggested exchanges 1 lean meat, 1 lean milk Advantedge:  110 kcal; 2.5 g fat; 5 g CHO; 17 g protein  Progress Towards Goal(s):  In progress.   Nutritional Diagnosis:  Stable progress on NB-2.1 Physical inactivity as related to deconditioning as evidenced by back to normal frequency of water aerobics.  Stable progress on NI-5.8.3 Inappropriate intake of types of carbohydrates (sugars) as related to sweets/desserts as evidenced by complying with Optifast regimen, but with intermittent CHO at third meal and snacks.    Intervention:  Nutrition counseling.  Monitoring/Evaluation:  Dietary intake, exercise, BG, and body weight in 1 week.

## 2012-09-04 ENCOUNTER — Ambulatory Visit (INDEPENDENT_AMBULATORY_CARE_PROVIDER_SITE_OTHER): Payer: 59 | Admitting: Family Medicine

## 2012-09-04 DIAGNOSIS — E119 Type 2 diabetes mellitus without complications: Secondary | ICD-10-CM

## 2012-09-04 NOTE — Patient Instructions (Addendum)
-   Continue keeping food record.    - Daily food goals:   - Optifast shake & water before AM exercise.   - Advantage shake (+/-) & more water or unsweetened post-exercise.   - Lunch: Other Optifast.   - Afternoon: Water or unsweetened tea; vegetables, if hungry   - Dinner: Lots of veg's; meat/other protein source. (Reminder: ground meat)   - After dinner: Advantage shake (+/-)  - Plan ahead for afternoon veg's.  - Aim for 10 PM bedtime; include bedtime in food record daily.   - Mixed greens (chard, kale, and spinach) at [employer].   - Also:  Ground chicken or [place] for meatballs or patties.

## 2012-09-04 NOTE — Progress Notes (Signed)
Medical Nutrition Therapy:  Appt start time: 1200 end time:  1230.  Assessment:  Primary concerns today: Weight management and Blood sugar control.  [name] has been working hard at her weight loss.  She was down almost 9 lb since last week, which also suggests last week's weight may have included some fluid retention.  [name] talked her husband into going to the movie, "Fed Up," which made a big impression on her.    24-hr recall:  (UP at 10:30 AM) B ( AM)-  Optifast      160 Snk ( AM)-  Advantage     110 Snk ( PM)-  Tea half sweet, half diet, 4 Oreos L (2 PM)-  2 chx drumsticks, 1/2 c slaw (threw up) ----- Snk (4:30)-  Optifast     160 D (6:30)-  2 tbsp raisins, 1/16 sliver cherry pie, unsweet tea  Snk (11:30)-  Advantage     110 Yesterday [name] went to her relatives, unexpectedly, so she had no time to plan any foods to bring.  At her sister's house she had 4 Oreos, and at her mother-in-law's house, she felt she had no choice but to eat at least a small piece of pie.  It is normal in this environment to have food somewhat forced on guests, and they were celebrating [name]'s birthday.     Optifast 800: 160 kcal; 3 g fat; 20 g CHO; 14 g protein; suggested exchanges 1 lean meat, 1 lean milk Advantedge:  110 kcal; 2.5 g fat; 5 g CHO; 17 g protein  Progress Towards Goal(s):  In progress.   Nutritional Diagnosis:  Stable progress on NB-2.1 Physical inactivity as related to deconditioning as evidenced by back to normal frequency of water aerobics.  Stable progress on NI-5.8.3 Inappropriate intake of types of carbohydrates (sugars) as related to sweets/desserts as evidenced by complying with Optifast regimen, but with intermittent CHO at third meal and snacks.    Intervention:  Nutrition counseling.  Monitoring/Evaluation:  Dietary intake, exercise, BG, and body weight in 1 week.

## 2012-09-11 ENCOUNTER — Encounter: Payer: Self-pay | Admitting: Family Medicine

## 2012-09-11 ENCOUNTER — Ambulatory Visit (INDEPENDENT_AMBULATORY_CARE_PROVIDER_SITE_OTHER): Payer: 59 | Admitting: Family Medicine

## 2012-09-11 DIAGNOSIS — E119 Type 2 diabetes mellitus without complications: Secondary | ICD-10-CM

## 2012-09-11 NOTE — Patient Instructions (Addendum)
-   Reminder: Web designer or [place] for meatballs or patties.  - Continue:  - Keeping food record daily.  - Usual exercise routine.  - Be careful to keep sodium intake low.     - If you eat out, think about how to eat the lowest-sodium.    - Veg's &/or almonds for aft snack, as needed.    - Remember 3:30 next week.

## 2012-09-11 NOTE — Progress Notes (Signed)
Medical Nutrition Therapy:  Appt start time: 1430 end time:  1530.  Assessment:  Primary concerns today: Weight management and Blood sugar control.  [name] has continued to work hard at her diet and exercise.  The only deviations in diet were 4 crackers on Saturday and a 1/2 bag of popcorn Friday in place of dinner.  On Sat & Sunday she was not able to keep food down after lunch or dinner (d/t popcorn previous day?).  She has been getting ~1 1/2 cups of veg's at dinner, and had veg's for a snack about 3 X last week.  [name] has done better at getting more sleep, always before 11 and sometimes by 10 PM.  [name] is starting to get nervous about upcoming surgery (2 1/2 wks from now).  Concerned that she will not meet her wt loss goal of ~282 lb.  She was 289.2 lb today.   BG have been in good range, w/ no change in med's.   Optifast 800: 160 kcal; 3 g fat; 20 g CHO; 14 g protein; suggested exchanges 1 lean meat, 1 lean milk Advantedge:  110 kcal; 2.5 g fat; 5 g CHO; 17 g protein  Progress Towards Goal(s):  In progress.   Nutritional Diagnosis:  Stable progress on NB-2.1 Physical inactivity as related to deconditioning as evidenced by continued participation in BELT exercise class 3 X wk and water aerobics 5 X wk.  Excellent progress on NI-5.8.3 Inappropriate intake of types of carbohydrates (sugars) as related to sweets/desserts as evidenced by complying with Optifast regimen, and no intake of sweets in past week.    Intervention:  Nutrition counseling.  Monitoring/Evaluation:  Dietary intake, exercise, BG, and body weight in 1 week.

## 2012-09-18 ENCOUNTER — Encounter: Payer: Self-pay | Admitting: Family Medicine

## 2012-09-18 ENCOUNTER — Ambulatory Visit (INDEPENDENT_AMBULATORY_CARE_PROVIDER_SITE_OTHER): Payer: 59 | Admitting: Family Medicine

## 2012-09-18 DIAGNOSIS — E119 Type 2 diabetes mellitus without complications: Secondary | ICD-10-CM

## 2012-09-18 NOTE — Progress Notes (Signed)
Medical Nutrition Therapy:  Appt start time: 1330 end time:  1430.  Assessment:  Primary concerns today: Weight management and Blood sugar control.  Elloise was at her mother-in-law's over the weekend where she and her family were helping her mother-in-law, who had been injured in a fall a couple weeks ago.  It was a stressful time, and environment filled with food, including lots of sweets.  In addn, Gyselle was cooking most of the time there, around food all day.  She did some inappropriate eating, but not much, i.e., a piece of chocolate.   Alauna goes for her pre-op appt tomorrow, and is still scheduled for surgery of hernia repair and penectomy July 26.    Optifast 800: 160 kcal; 3 g fat; 20 g CHO; 14 g protein; suggested exchanges 1 lean meat, 1 lean milk Advantedge:  110 kcal; 2.5 g fat; 5 g CHO; 17 g protein  Progress Towards Goal(s):  In progress.   Nutritional Diagnosis:  Stable progress on NB-2.1 Physical inactivity as related to deconditioning as evidenced by continued participation in BELT exercise class 3 X wk and water aerobics 5 X wk.  Excellent progress on NI-5.8.3 Inappropriate intake of types of carbohydrates (sugars) as related to sweets/desserts as evidenced by complying with Optifast regimen, and no intake of sweets in past week.    Intervention:  Nutrition counseling.  Monitoring/Evaluation:  Dietary intake, exercise, BG, and body weight in 1 week.

## 2012-09-18 NOTE — Patient Instructions (Addendum)
-   Tonight's event:  - Eat before you go.    - Scope out the food there; decide what you might want.   - Be mindful: Have something to eat if you really want it.    - Once you have eaten, throw away your plate, and MOVE away from the food.   - Focus on the people.  - This week:    1. Continue usual exercise.   2. Stay home this weekend.    3. Stick with your meals and snacks schedule.    4. Snacks:  Get a bit of variety (per week):  Berries, ~20 almonds, veg's.   - Keep me posted!

## 2012-09-22 ENCOUNTER — Ambulatory Visit (INDEPENDENT_AMBULATORY_CARE_PROVIDER_SITE_OTHER): Payer: 59 | Admitting: Family Medicine

## 2012-09-22 VITALS — BP 128/65 | HR 77 | Ht 68.0 in | Wt 292.0 lb

## 2012-09-22 DIAGNOSIS — E119 Type 2 diabetes mellitus without complications: Secondary | ICD-10-CM

## 2012-09-22 NOTE — Progress Notes (Signed)
Subjective:  Patient presents today for 3 month diabetes follow-up as part of the employer-sponsored Link to Wellness program. Current diabetes regimen includes Novolog sliding scale with dinner, & metformin 1000 mg once daily. Patient also continues on daily ASA, ARB, and statin. Most recent MD follow-up was in May. No med changes or major health changes at this time.   Patient is anticipating surgery next Thursday for hernia surgery. In anticipation of surgery she has stopped (temporarily) phentermine and aspirin. She anticipates being in the hospital for at least 7 days.    Disease Assessments:  Diabetes:  Type of Diabetes: Type 2; Year of diagnosis 2003; Sees Diabetes provider 3 times per year; MD managing Diabetes Dr. 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   Other Diabetes History: Checking CBG twice daily- always in the morning fasting and then again some time in the evening, usually more than 2 hours after eating dinner.    Physical Activity-  ~540 minutes weekly.  MWF [facility]. She has a Paediatric nurse for 90 minutes three days a week. She has a twice a week 60 minute high impact water aerobics. She often will do an additional 30 mintues of walking in the pool. She is going to the pool MWF for the aerobics after her HOPE program finishes. She is spending 60-90 minutes MWF in the pool. She is walking around her neighborhood too.   Nutrition- seeing Wyona Almas, RD for dietary management. She states that things have been going OK. Opti Fast shake in the morning, Advant-edge shake after workout, lunch- opti-fast shake; afternoon- splenda tea and advant-edge shake. Dinner- palm sized serving of protein and vegetables.    She has been eating out more often this past week because she is worried she will die during surgery.   Vital Signs:  09/22/2012 1:57 PM (EST) Blood Pressure 128 / 65 mm/Hg; Height 5 ft 8 in; Pulse Rate 77 bpm    Testing:  Blood Sugar Tests:  Hemoglobin A1c: 6.0 resulted on 08/21/2012    Assessment/Plan: Patient is a 62 year old female with DM2. Her A1C is 6.0% which is meeting her goal of less than 6.5%. Patient expressed the desire to get her A1C down to 5%. I explained that this was not realistic and that an A1C of 6% was really good and she should be pleased with her result.   Patient is scheduled to have hernia repair surgery next week. She is down to 290 pounds (10 pound weight loss since last appointment). She is nervous for the surgery and is hoping that the surgery will not be cancelled again like it was in April. We spent the majority of the visit formulating a plan for getting back into physical activity once she is cleared for exercise following surgery.   Follow up with patient in 3 months..    Goals for Next Visit-(these are the goals the patient stated and articulated to me)  1. Survive [facility] hospitalization. Have the capacity to walk out of the hospital.  2. Once you are cleared for exercise and being in the pool, restart the HOPE program and GAC. Work your way back up to the exercise program you are currently doing.  3. After surgery, keep checking your  blood sugar and make an appointment with Dr. Evlyn Kanner if your blood sugars are elevated.  Next appointment with me is Monday September 22nd at 1:30 PM.

## 2012-10-23 ENCOUNTER — Ambulatory Visit: Payer: 59 | Admitting: Family Medicine

## 2012-11-29 ENCOUNTER — Encounter: Payer: Self-pay | Admitting: Internal Medicine

## 2012-12-25 ENCOUNTER — Ambulatory Visit (INDEPENDENT_AMBULATORY_CARE_PROVIDER_SITE_OTHER): Payer: Self-pay | Admitting: Family Medicine

## 2012-12-25 VITALS — BP 138/63 | HR 82 | Ht 68.0 in | Wt 289.0 lb

## 2012-12-25 DIAGNOSIS — E119 Type 2 diabetes mellitus without complications: Secondary | ICD-10-CM

## 2012-12-25 NOTE — Progress Notes (Signed)
Subjective:  Patient presents today for 3 month diabetes follow-up as part of the employer-sponsored Link to Wellness program. Current diabetes regimen includes Lantus 5 units SQ every morning, Novolog sliding scale as needed and metformin 1000 mg daily. Patient also continues on daily ASA, ARB, and statin.    She had complications following her surgery. She has a 2 mm deep wound that is roughly 11cm across by 21 cm. She is wearing a wound vac for this. Her pain has improved since the surgery and she is no longer taking pain medication. She also reports that she can now lie on her side.   In the hospital her wound grew MSSA, proteus, psuedomonas. She is seeing an ID specialist at [facility] for this. She is taking cipro and duricef as antibiotics. She is taking these both until her C Reactive protein is 1. Her latest result was 2.   She has been cleared for exercise. She has started walking again. She cannot swim at this point- she estimates that it will be about a year before she can swim again.   She saw Dr. Evlyn Kanner over the summer and has an appointment pending with him next week. She restarted Lantus 5 units once daily in attempt to keep CBG less than 150 to help promote wound healing.    Disease Assessments:  Diabetes:  Type of Diabetes: Type 2; Year of diagnosis 2003; Sees Diabetes provider 3 times per year; MD managing Diabetes Dr. 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  After dinner averages- 7 day 146, 14 day 152, 30 day 162.   We downloaded her BG meter to  the [employer]. She seemed surprised to see that her blood sugars were higher in the afternoon. Most of the high results (>200) are from eating higher carbohydrate foods like cereal, hush puppies. Patient admits to eating out more often and her portion sizes have increased. This has likely led to elevated CBGs.      Physical Activity-  She has been cleared for exercise now, but she cannot swim due to her wound. She has been walking during the week. She is walking daily for 15-20 minutes. She reports that she is walking fine. She hasn't been able to go back to [facility] to the [facility]. She is waiting for clearance to disconnect from her wound vac to be able to use the elliptical, free weights, exercise equipment.   Nutrition- seeing Wyona Almas, RD for dietary management.   Because of her wound she is eating/drinking more protein and is trying to get 100 grams daily.   Her lap band was removed during the surgery.   She reports that she is getting more calories- 1400-1500 now daily. She is scheduled to follow up with [facility]. She reports that she would like to lose another 60 pounds.  She admits to eating out more often.        Hemoglobin A1c: 10/03/2012 5.8      Objective:  Musculoskeletal: feet and toes normal.       Assessment/Plan: Patient is a  62 year old female with DM2. Her most recent A1C is less than 6% and is currently meeting her glycemic goal. Recently her blood sugars have been elevated compared to the time period preceding surgery. This is likely because she has expanded her diet and has increased caloric consumption following her surgery. Her lap band was removed during the surgery and she reports that it was probably helping her more than she thought because her appetite has increased. She has restarted taking Lantus 5 units once daily and is also using sliding scale Novolog when CBG>150.   Further complicating matters is a wound that is not healing. She is  wearing a wound vac currently and is followed by 2 physicians with [facility]. She has been asked to consume 110-120 grams of protein daily to promote wound healing and has found it difficult to do so. She has a follow up appointment scheduled in a couple of weeks to follow up the dietitian at [facility].   Patient has recently been cleared to exercise and is walking. She would like to go back to the [facility] program with [facility] so that she can use the exercise equipment. She is worried about using the wound vac while she is on the elliptical machine. When she came into the pharmacy last week and talked to me about exercise I suggested asking her physician if she can disconnect from her wound vac for a short amount of time so that she can exercise. She called their office and is waiting to hear back from them.   I encouraged patient to continue walking even if she is unable to disconnect from the wound vac. Patient would like to lose weight, but we left her weight goal as maintenance as I'm not sure if she will be able to complete caloric restriction while still meeting her daily protein intake requirements for wound healing. Hopefully the dietitian can assist her in this area.   Follow up with patient in 3 months..    Goals for Next Visit-  1. Increase physical activity. Follow up to see if you can disconnect from your wound vac to be able to exercise at [facility] with the [facility] program. Keep walking daily and try to increase duration. 2. Nutrition- go and see [name] again and follow her recommendations.  3. Maintain weight and do not gain weight by the time you come back for a follow up appointment.   Next appointment with me is December 15th at 11:30 AM.   Ellender Hose. Vivia Ewing, PharmD, BCPS

## 2013-01-08 ENCOUNTER — Ambulatory Visit (INDEPENDENT_AMBULATORY_CARE_PROVIDER_SITE_OTHER): Payer: 59 | Admitting: Family Medicine

## 2013-01-08 ENCOUNTER — Encounter: Payer: Self-pay | Admitting: Family Medicine

## 2013-01-08 DIAGNOSIS — E119 Type 2 diabetes mellitus without complications: Secondary | ICD-10-CM

## 2013-01-08 NOTE — Progress Notes (Signed)
Medical Nutrition Therapy:  Appt start time: 1430 end time:  1530.  Assessment:  Primary concerns today: Weight management and Blood sugar control.  Immediately following surgery [name] was very vigilant about her diet, focusing on lots of protein, and limiting kcal to aid continued wt loss.  She lost down to the 270's, before finally getting frustrated enough with infection and complications of her wound, that she started eating "bad foods," which seemed to coincide with the beginning of wound healing finally.  I speculated that her energy deficit was sufficient to impede healing despite a high proportion of her diet being protein.  She has since regained to 289 lb, but her wound is mostly healed, and she is waiting to hear when she can have a skin graft to complete this process.   Recent physical activity includes occasional walking.  Cannot swim or do the [facility] exercise program till she is done with her wound vac.  Right now she is struggling with food choices.  We discussed what makes group efforts at weight loss and lifestyle change successful, and [name] is interested in designing a plan for herself that may borrow some of these principles - if she can find at least one other person to join her in this effort.    24-hr recall:  (Up at 9:30 AM) B (10 AM)-   1 Protein shake (17 g, 100 kcal) Snk (11 AM)-   1 protein bar (20 g, 190 kcal) L (1 PM)-  1 protein bar (20 g, 190 kcal) Snk (3 PM)-  Stk & Shk [place] Melt & vanilla shake D (8 PM)-  2 svngs meatloaf, 1/2 svng squash, Splenda tea Snk (12 AM)-  1 protein bar (20 g, 190 kcal)   Progress Towards Goal(s):  In progress.   Nutritional Diagnosis:  NB-2.1 Physical inactivity As related to wound healing.  As evidenced by inability to exercise much b/c of wound vac.    Intervention:  Nutrition education.  Monitoring/Evaluation:  Dietary intake, exercise, and body weight in 3 week(s).

## 2013-01-08 NOTE — Patient Instructions (Addendum)
-   Come up with a plan, and some ideas of who might like to join you in a challenge.  Below is an example of last summer's Good Habits Challenge:  48 oz water, 64 oz fluid  10 min stretching  Ex 5 X wk  Record # hours of sleep  No processed grains  No processed meats  No more than 1 glass red wine/week  No more than 1 tsp added sugar   No more than 2 oz cheese  No more than 2 tbsp nut butter - Email [name] your ideas, and we'll discuss.

## 2013-01-29 ENCOUNTER — Ambulatory Visit (INDEPENDENT_AMBULATORY_CARE_PROVIDER_SITE_OTHER): Payer: 59 | Admitting: Family Medicine

## 2013-01-29 ENCOUNTER — Encounter: Payer: Self-pay | Admitting: Family Medicine

## 2013-01-29 DIAGNOSIS — E119 Type 2 diabetes mellitus without complications: Secondary | ICD-10-CM

## 2013-01-29 NOTE — Progress Notes (Signed)
Medical Nutrition Therapy:  Appt start time: 1430 end time:  1530.  Assessment:  Primary concerns today: Weight management and Blood sugar control.  Elizabethanne will be having skin graft surgery Nov 4.  She has done a bit more walking, but not with regularity.  Calina admits that her food choices have been poor, and she was not surprised today to see she'd gained weight.  She was surprised, however, at seeing the 300's again, which may prompt her to ramp up her efforts.   Altheia spoke to her husband Dorene Sorrow re. devising a plan for her continued weight loss efforts, but they never managed to carve out the time to do so.    24-hr recall suggests intake of ~2130 kcal:  (UP at 9 AM) B (9:15 AM)-  Protein drink          100 kcal  Snk (11:30)-  Stk & Shk [place] Melt, vanilla shake   ~1350 L ( PM)-  none Snk (6 PM)-  Small roll w/ beef, 3 slc ham, 1/2 slc chs, wine     320 D ( PM)-  none Snk (11 PM)-  beef enchilada, 1/2 chs enchilada, protein drink     460  Progress Towards Goal(s):  In progress.   Nutritional Diagnosis:  NB-2.1 Physical inactivity As related to wound healing.  As evidenced by inability to exercise much b/c of wound vac.    Intervention:  Nutrition education.  Monitoring/Evaluation:  Dietary intake, exercise, and body weight in 1 week(s).

## 2013-01-29 NOTE — Patient Instructions (Addendum)
Diet Recommendations for Diabetes   Starchy (carb) foods include: Bread, rice, pasta, potatoes, corn, crackers, bagels, muffins, all baked goods.  (Fruits, milk, and yogurt also have carbohydrate, but most of these foods will not spike your blood sugar as the starchy foods will.)  A few fruits do cause high blood sugars; use small portions of bananas (limit to 1/2 at a time), grapes, and tropical fruits.    - ONE slice of bread (or its equivalent, such as half of a hamburger bun).   - 1/2 cup of a "scoopable" starchy food such as potatoes or pasta.  (For rice, it's 1/3 cup.)   - 15 grams of carbohydrate as shown on food label.   - Explore options for breads, i.e., sandwich rounds, wraps, 40-kcal bread...   GOALS: 1. Walk at least 20 minutes 7 days a week.   2. Limit starchy foods to one exchange per meal or snack.   3. Limit sweets to 2 X wk.  One serving =  milk shake; ask for 2 cups, 1 shake, and freeze    one.  Also:  Only eat sweets when seated, not in the car.   Bring your completed GOALS SHEET to follow-up.

## 2013-02-05 ENCOUNTER — Ambulatory Visit: Payer: 59 | Admitting: Family Medicine

## 2013-02-08 ENCOUNTER — Other Ambulatory Visit: Payer: Self-pay

## 2013-02-09 ENCOUNTER — Encounter: Payer: Self-pay | Admitting: Internal Medicine

## 2013-02-19 ENCOUNTER — Ambulatory Visit: Payer: 59 | Admitting: Family Medicine

## 2013-02-26 ENCOUNTER — Ambulatory Visit (INDEPENDENT_AMBULATORY_CARE_PROVIDER_SITE_OTHER): Payer: 59 | Admitting: Family Medicine

## 2013-02-26 ENCOUNTER — Encounter: Payer: Self-pay | Admitting: Family Medicine

## 2013-02-26 DIAGNOSIS — E119 Type 2 diabetes mellitus without complications: Secondary | ICD-10-CM

## 2013-02-26 NOTE — Progress Notes (Signed)
Medical Nutrition Therapy:  Appt start time: 1430 end time:  1530.  Assessment:  Primary concerns today: Weight management and Blood sugar control.  Shomari said she is hungry all the time, and she does not recognize feeling full.  This is very different from before surgery, when her lap band was removed.  She is experimenting with topamax, but just started it two days ago, so has not noticed if it has helped her appetite yet.  Verble had her worst eating day of all yesterday, when she was home alone, and finally just slept rather than give in more to her hunger signals.   FBG has been ~125.    24-hr recall:  (Up at 9:30 AM) B (9:45 AM)-  Protein shake (100 kcal) Snk (10:30)-  8 Pillsbury rolls w/ jelly L ( PM)-  1 protein shake Slept from 1-4 PM Snk ( PM)-  1 protein shake D ( PM)-  2 oz pork roast, baked potato w/ butter, 36 oz okra soup Snk ( PM)-  Splenda tea  Progress Towards Goal(s):  In progress.   Nutritional Diagnosis:  NB-2.1 Physical inactivity As related to continued recovery from surgery.  As evidenced by no regular exercise yet.    Intervention:  Nutrition education.  Monitoring/Evaluation:  Dietary intake, exercise, and body weight in 1 week(s).

## 2013-02-26 NOTE — Patient Instructions (Signed)
Eat at least 3 meals and 1-2 snacks per day.  Aim for no more than 5 hours between eating.  Be aware of the effects of hyper-palatable foods (sugar, fat, and/or). Advance preparation for meals:  Several sweet potatoes, large amount of chicken, vegetables.    Goals:   1. Protein with each meal. 2. Lunch and dinner to include protein, veg's and one starch.   3. Write down each time you eat a sweet.

## 2013-03-12 ENCOUNTER — Telehealth: Payer: Self-pay | Admitting: Internal Medicine

## 2013-03-12 DIAGNOSIS — G4733 Obstructive sleep apnea (adult) (pediatric): Secondary | ICD-10-CM

## 2013-03-12 NOTE — Telephone Encounter (Signed)
ATC pt x2, line busy.  WCB.

## 2013-03-12 NOTE — Telephone Encounter (Signed)
I spoke with pt. She reports she has been feeling sleepy during the day x 2 weeks. She notices it is worse when she is driving. She currently uses CPAP every night. Pt is scared d/t this. She hasn't noticed any problems with her machine/equipement. Please advise Dr. Maple Hudson thanks

## 2013-03-12 NOTE — Telephone Encounter (Signed)
This sounds like a brand-new issue. Is she getting enough sleep? Is there some apparent change in how her CPAP machine is functioning? We can give sample card of Nuvigil 150 mg tabs, to try 1 each monring day by day as needed.

## 2013-03-13 NOTE — Telephone Encounter (Signed)
Order [facility]ME [facility]- increase CPAP from 12 to 13    Dx OSA

## 2013-03-13 NOTE — Telephone Encounter (Signed)
I called and spoke with pt. She reports she sleeps about 9-10 hrs a night. Machine seems fine and nothing diff. She does not really want to add any medications. She was thinking maybe adjusting her pressure settings. Please advise Dr. Maple Hudson thanks

## 2013-03-13 NOTE — Telephone Encounter (Signed)
Pt aware of recs. Order placed. Message sent to [facility].

## 2013-03-14 ENCOUNTER — Telehealth: Payer: Self-pay | Admitting: Internal Medicine

## 2013-03-14 ENCOUNTER — Ambulatory Visit (AMBULATORY_SURGERY_CENTER): Payer: PRIVATE HEALTH INSURANCE

## 2013-03-14 VITALS — Ht 68.0 in | Wt 313.0 lb

## 2013-03-14 DIAGNOSIS — Z8489 Family history of other specified conditions: Secondary | ICD-10-CM

## 2013-03-14 DIAGNOSIS — G4733 Obstructive sleep apnea (adult) (pediatric): Secondary | ICD-10-CM

## 2013-03-14 DIAGNOSIS — Z8379 Family history of other diseases of the digestive system: Secondary | ICD-10-CM

## 2013-03-14 MED ORDER — MOVIPREP 100 G PO SOLR: 1.0000 | Freq: Once | ORAL | Status: DC

## 2013-03-14 NOTE — Telephone Encounter (Signed)
LMTCB

## 2013-03-15 NOTE — Telephone Encounter (Signed)
Order placed -- lmomtcb to inform pt. 

## 2013-03-15 NOTE — Telephone Encounter (Signed)
Returning call can be reached at [phone].[name]

## 2013-03-15 NOTE — Telephone Encounter (Signed)
Called, spoke with pt - On 12/9, order was sent to have [facility] increase pt's cpap from 12 to 13.   Pt reports she took machine to [facility] yesterday and was advised her machine is already set on 13.   She is requesting further recs. Phone msg from 12/8 printed for CDY to read with this msg. Please advise.  Thank you.

## 2013-03-15 NOTE — Telephone Encounter (Signed)
Order- DME- increase CPAP to 15    Dx OSA

## 2013-03-15 NOTE — Telephone Encounter (Signed)
Pt aware that order placed.  Nothing further needed.  

## 2013-03-15 NOTE — Telephone Encounter (Signed)
Patient returning call.  [phone]

## 2013-03-19 ENCOUNTER — Ambulatory Visit: Payer: 59 | Admitting: Family Medicine

## 2013-03-19 ENCOUNTER — Ambulatory Visit (INDEPENDENT_AMBULATORY_CARE_PROVIDER_SITE_OTHER): Payer: Self-pay | Admitting: Family Medicine

## 2013-03-19 VITALS — BP 144/70 | HR 82 | Ht 68.0 in | Wt 310.0 lb

## 2013-03-19 DIAGNOSIS — E119 Type 2 diabetes mellitus without complications: Secondary | ICD-10-CM

## 2013-03-19 NOTE — Progress Notes (Signed)
Subjective:  Patient presents today for 3 month diabetes follow-up as part of the employer-sponsored [employer] to Wellness program. Current diabetes regimen includes Lantus 7 units SQ each morning, Novolog 2-8 unit sliding scale with meals and at bedtime, metformin 1000 mg once daily. Patient also continues on daily ASA, ARB and statin.   Patient was using the wound vac for about 3 months. November 4th she had a skin graft at [facility] and a 3"x8" rectangular piece of skin grafted from her left thigh to be placed over the wound site in her abdomen. The wound site on her abdomen was from the hernia repair surgery in June. She has a follow up appointment with the surgeon at [facility] this coming week for evaluation. She states that the wound site is pink but is still bleeding. The graft site on her thigh is still pink, but she states that it is looking much better and has been filling in. She is no longer taking antibiotics (was on ciprofloxacin and cefadroxil for 3 months). She reports that this has caused cravings for food lately and reports intense hunger. Her ID physician from [facility] told her that she could have these cravings for 4 months.   She has started taking probiotics- Align, 1 capsule daily. She is also taking 7 units Lantus once daily and 2-8 units Humalog sliding scale with meals.   Her last appointment with Dr. Evlyn Kanner was in November. At that point Lantus was increased from 5 units to 7 units.    Disease Assessments:  Diabetes:  Type of Diabetes: Type 2; Year of diagnosis 2003; Sees Diabetes provider 3 times per year; MD managing Diabetes Dr. 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 7 %; Other Diabetes History: Her last  A1C with Dr. Evlyn Kanner was 5.8% in November.   She states she is checking fasting CBG and then again at bedtime. She is taking Lantus daily no matter what. The Humalog is given prn CBG>130. This is usually only given with breakfast or at bedtime since she is not checking before lunch or dinner.   We downloaded patients meter to the [employer]. Most of readings are between 131-161. On average fasting are around 140-150. Although in the past week that she has gone back to [facility] for the [facility] exercise program, average blood sugars are about 10 points lower. Bedtime CBGS are running between 160-180s.    Physical Activity-  She has restarted HOPE program at [facility]. She is going MWF for 75 minutes. She cannot swim right now because of her wound. She also reports that she is doing some yard work around the house and is trying to be more active.   Nutrition- seeing Wyona Almas, RD for dietary management.   Because of the long term antibiotic use she has developed intense food cravings.   Typical Day-  She states that she is eating throughout the day. She states she is eating smaller meals, but feels like she is eating all of the time.   B- protein shakes (17 g protein, 100 kcal), small biscuit, 2 slices bacon, 2 eggs, small amount of milk; She reports she was hungry an hour later and ate a protein bar (19 g  protein, 190 kcal); 30 mins later she ate another protein bar. Over the whole day she ate 4 protein bars.   L- no lunch because she woke up later and she ate the protein bars.   D- 2 beef enchiladas from [place] restaraunt. 20 chips, no cheese dip, just salsa.   snack- popcorn      Dilated Eye Exam: 04/05/2012  Foot Exam: 04/10/2012     03/19/2013 12:03 PM (EST) BMI 47.1; Height 5 ft 8 in; Weight 310 lbs    Testing:  Blood Sugar Tests:  Hemoglobin A1c: 5.8 Via Dr. Rinaldo Cloud office resulted on 02/09/2013       Assessment/Plan: Patient is a 62 year old female with DM2. Her most  recent A1C with Dr. Evlyn Kanner in November was 5.8% and is meeting A1C goal of less than 7%. Patient has gained 21 pounds since her last appointment with me. She has been unable to exercise until this past week because of a surgical wound that was not healing. She was instructed to eat more protein during the wound healing time, but she admits that she was also eating more calories during that time too. Her lap band was also removed during the hernia repair surgery. Lastly she states that she is having daily, intense cravings for food. All of these situations and conditions have resulted in the weight gain. Patient is still seeing Wyona Almas, RD with [facility].   Patient is frustrated with the weight gain and overall very frustrated and disappointed with her recovery following the hernia repair surgery in June. We discussed ways to satisfy hunger cravings during the day without overloading on calories. I reminded patient to drink plenty of non caloric beverages during the day. Patient agreed to start snacking on vegetables, fruits and other lower calorie food options. She states that she likes popcorn and we found a few 100 calorie popcorn options.   Patient has begun physical activity again and already fasting CBGS are down about 10 points since she started. I reminded patient that a year ago when she weighed more than she does now, she was able to completely d/c insulin because her physical activity was so high during the week. I encouraged patient to continue with the [facility] program and [facility] and to find ways to be more physically active the rest of the days of the week. Patient wants to start swimming again, but she will not be cleared for swimming until the wound on her abdomen is fully healed.   Patient is checking CBGS twice daily, fasting and again at bedtime. I encouraged her to instead check fasting and again before meals so that she could better match insulin administration to the times that she  was eating.   Patient will lose [facility] insurance coverage in March. I will have one more visit with her before her benefits are terminated. Follow up with patient in 3 months..   Other Goals For Next Visit-  1. Ideas for snacking when you are hungry- 100 calorie popcorn bags, raw carrots. Make sure you are also drinking lots of non calorie fluids. 2. Start checking blood sugar before lunch and before dinner. This way you can catch hyperglycemia earlier than bedtime and you can take action faster if you have an elevated CBG.  3. Keep going to [facility] on MWF. Try to be as active as you can on the other days.  Next appointment to see me is Monday March 16th at 11:30 AM.

## 2013-03-20 ENCOUNTER — Ambulatory Visit (INDEPENDENT_AMBULATORY_CARE_PROVIDER_SITE_OTHER): Payer: 59 | Admitting: Family Medicine

## 2013-03-20 ENCOUNTER — Encounter: Payer: Self-pay | Admitting: Family Medicine

## 2013-03-20 DIAGNOSIS — E119 Type 2 diabetes mellitus without complications: Secondary | ICD-10-CM

## 2013-03-20 NOTE — Patient Instructions (Signed)
Quick Decoding:  Next time you are struggling with a food craving, ask yourself, and write down:  1. What AM I feeling now? 2. How do I WANT to feel? 3. What do I really NEED right now?  Write these 3 Qs on a few 3X5 cards, and write your answers on the cards.    Sit down for this process.   Ask yourself after each Q, "Is there anything else?"  Email [name] with some responses over the next 1-2 weeks.

## 2013-03-20 NOTE — Progress Notes (Signed)
Medical Nutrition Therapy:  Appt start time: 1015 end time:  1100.  Assessment:  Primary concerns today: Weight management and Blood sugar control.  [name] has been more active; she has been exercising at [facility] 3 X wk, and she has even raked leaves at home, but she is still feeling extreme hunger.  She has started on Align probiotic, having recently reading a lot about association between antibiotic use and obesity.  This extreme hunger is certainly new to [name]; she has a history of some poor food decisions and even cravings at times, but nowhere near as severe as currently.    Progress Towards Goal(s):  In progress.   Nutritional Diagnosis:  Progress noted on NB-2.1 Physical inactivity As related to continued recovery from surgery.  As evidenced by restarting [employer] exercise program 3 X wk and intentional increased activity at home.    Intervention:  Nutrition education.  Monitoring/Evaluation:  Dietary intake, exercise, and body weight in 2 week(s).

## 2013-03-28 ENCOUNTER — Encounter: Payer: Self-pay | Admitting: Internal Medicine

## 2013-03-28 ENCOUNTER — Ambulatory Visit (AMBULATORY_SURGERY_CENTER): Payer: 59 | Admitting: Internal Medicine

## 2013-03-28 VITALS — BP 132/55 | HR 73 | Temp 98.0°F | Resp 24 | Ht 68.0 in | Wt 313.0 lb

## 2013-03-28 DIAGNOSIS — Z83719 Family history of colon polyps, unspecified: Secondary | ICD-10-CM

## 2013-03-28 DIAGNOSIS — Z8379 Family history of other diseases of the digestive system: Secondary | ICD-10-CM

## 2013-03-28 DIAGNOSIS — Z1211 Encounter for screening for malignant neoplasm of colon: Secondary | ICD-10-CM

## 2013-03-28 DIAGNOSIS — Z8371 Family history of colonic polyps: Secondary | ICD-10-CM

## 2013-03-28 LAB — GLUCOSE, CAPILLARY
Glucose-Capillary: 131 mg/dL — ABNORMAL HIGH (ref 70–99)
Glucose-Capillary: 154 mg/dL — ABNORMAL HIGH (ref 70–99)

## 2013-03-28 MED ORDER — SODIUM CHLORIDE 0.9 % IV SOLN: 500.0000 mL | INTRAVENOUS | Status: DC

## 2013-03-28 NOTE — Progress Notes (Signed)
Pt has abd wound from hernia surgery in June, wound got infection, had to have skin graph, pt states only a corner of mesh is showing, she has a wet to dry dressing on it-adm

## 2013-03-28 NOTE — Op Note (Signed)
[facility] [address] [place], [address]   COLONOSCOPY PROCEDURE REPORT  PATIENT: [name], [name]  MR#: [id number] BIRTHDATE: [birth year] , 62  yrs. old GENDER: Female ENDOSCOPIST: Hart Carwin, MD REFERRED XB:[id number] Evlyn Kanner, M.D. PROCEDURE DATE:  03/28/2013 PROCEDURE:   Colonoscopy, screening First Screening Colonoscopy - Avg.  risk and is 50 yrs.  old or older - No.  Prior Negative Screening - Now for repeat screening. 10 or more years since last screening  History of Adenoma - Now for follow-up colonoscopy & has been > or = to 3 yrs.  N/A  Polyps Removed Today? No.  Recommend repeat exam, <10 yrs? No. ASA CLASS:   Class III INDICATIONS:Average risk patient for colon cancer and last colonoscopy January 2004 showed internal hemorrhoids, positive family history of colon polyps. MEDICATIONS: MAC sedation, administered by CRNA and propofol (Diprivan) 250mg  IV  DESCRIPTION OF PROCEDURE:   After the risks benefits and alternatives of the procedure were thoroughly explained, informed consent was obtained.  A digital rectal exam revealed no abnormalities of the rectum.   The LB [id number]  endoscope was introduced through the anus and advanced to the cecum, which was identified by both the appendix and ileocecal valve. No adverse events experienced.   The quality of the prep was good, using MoviPrep  The instrument was then slowly withdrawn as the colon was fully examined.      COLON FINDINGS: A normal appearing cecum, ileocecal valve, and appendiceal orifice were identified.  The ascending, hepatic flexure, transverse, splenic flexure, descending, sigmoid colon and rectum appeared unremarkable.  No polyps or cancers were seen. Small internal hemorrhoids were found.  Retroflexed views revealed no abnormalities. The time to cecum=8 minutes 36 seconds. Withdrawal time=8 minutes 14 seconds.  The scope was withdrawn and the procedure  completed. COMPLICATIONS: There were no complications.  ENDOSCOPIC IMPRESSION: 1.   Normal colon 2.   Small internal hemorrhoids  RECOMMENDATIONS: high fiber diet Recall colonoscopy in 10 years   eSigned:  Hart Carwin, MD 03/28/2013 8:40 AM   cc:

## 2013-03-28 NOTE — Patient Instructions (Signed)
YOU HAD AN ENDOSCOPIC PROCEDURE TODAY AT THE [facility]: Refer to the procedure report that was given to you for any specific questions about what was found during the examination.  If the procedure report does not answer your questions, please call your gastroenterologist to clarify.  If you requested that your care partner not be given the details of your procedure findings, then the procedure report has been included in a sealed envelope for you to review at your convenience later.  YOU SHOULD EXPECT: Some feelings of bloating in the abdomen. Passage of more gas than usual.  Walking can help get rid of the air that was put into your GI tract during the procedure and reduce the bloating. If you had a lower endoscopy (such as a colonoscopy or flexible sigmoidoscopy) you may notice spotting of blood in your stool or on the toilet paper. If you underwent a bowel prep for your procedure, then you may not have a normal bowel movement for a few days.  DIET: Your first meal following the procedure should be a light meal and then it is ok to progress to your normal diet.  A half-sandwich or bowl of soup is an example of a good first meal.  Heavy or fried foods are harder to digest and may make you feel nauseous or bloated.  Likewise meals heavy in dairy and vegetables can cause extra gas to form and this can also increase the bloating.  Drink plenty of fluids but you should avoid alcoholic beverages for 24 hours.  ACTIVITY: Your care partner should take you home directly after the procedure.  You should plan to take it easy, moving slowly for the rest of the day.  You can resume normal activity the day after the procedure however you should NOT DRIVE or use heavy machinery for 24 hours (because of the sedation medicines used during the test).    SYMPTOMS TO REPORT IMMEDIATELY: A gastroenterologist can be reached at any hour.  During normal business hours, 8:30 AM to 5:00 PM Monday through Friday,  call [phone].  After hours and on weekends, please call the GI answering service at [phone] who will take a message and have the physician on call contact you.   Following lower endoscopy (colonoscopy or flexible sigmoidoscopy):  Excessive amounts of blood in the stool  Significant tenderness or worsening of abdominal pains  Swelling of the abdomen that is new, acute  Fever of 100F or higher   FOLLOW UP Our staff will call the home number listed on your records the next business day following your procedure to check on you and address any questions or concerns that you may have at that time regarding the information given to you following your procedure. This is a courtesy call and so if there is no answer at the home number and we have not heard from you through the emergency physician on call, we will assume that you have returned to your regular daily activities without incident.  SIGNATURES/CONFIDENTIALITY: You and/or your care partner have signed paperwork which will be entered into your electronic medical record.  These signatures attest to the fact that that the information above on your After Visit Summary has been reviewed and is understood.  Full responsibility of the confidentiality of this discharge information lies with you and/or your care-partner.  Please continue your normal medications  Please read over handout about hemorrhoids and high fiber diets  Follow up in 10 years

## 2013-03-28 NOTE — Progress Notes (Signed)
A/ox3 pleased with MAC, report to Kristin RN 

## 2013-04-02 ENCOUNTER — Ambulatory Visit: Payer: 59 | Admitting: Family Medicine

## 2013-04-02 ENCOUNTER — Telehealth: Payer: Self-pay | Admitting: *Deleted

## 2013-04-02 NOTE — Telephone Encounter (Signed)
  Follow up Call-  Call back number 03/28/2013  Post procedure Call Back phone  # [phone]  Permission to leave phone message Yes     Patient questions:  Do you have a fever, pain , or abdominal swelling? no Pain Score  0 *  Have you tolerated food without any problems? yes  Have you been able to return to your normal activities? yes  Do you have any questions about your discharge instructions: Diet   no Medications  no Follow up visit  no  Do you have questions or concerns about your Care? no  Actions: * If pain score is 4 or above: No action needed, pain <4.

## 2013-04-09 ENCOUNTER — Ambulatory Visit (INDEPENDENT_AMBULATORY_CARE_PROVIDER_SITE_OTHER): Payer: 59 | Admitting: Family Medicine

## 2013-04-09 ENCOUNTER — Encounter: Payer: Self-pay | Admitting: Family Medicine

## 2013-04-09 DIAGNOSIS — E119 Type 2 diabetes mellitus without complications: Secondary | ICD-10-CM

## 2013-04-09 NOTE — Patient Instructions (Addendum)
-   Next time you are struggling with a food craving, ask yourself, and write down:    1. What AM I feeling now?   2. How do I WANT to feel?   3. What do I really NEED right now?   Write these 3 Qs on a few 3X5 cards, and write your answers on the cards.   Sit down for this process.   Ask yourself after each Q, "Is there anything else?"  - Food recommendations:  Protein at each meal; Vegetables twice a day.  - Exercise:  HOPE program 3 X wk; walking when URI clears completely.   - See Jalene Mullet at [facility] re. probiotics.

## 2013-04-09 NOTE — Progress Notes (Signed)
Medical Nutrition Therapy:  Appt start time: 1330 end time:  1430.  Assessment:  Primary concerns today: Weight management and Blood sugar control.  [name] has continued to have an insatiable appetite.  She tried to answer the three Qs recommended, but did not write down answers, and Gizel said answering the Qs did not change the outcome.  I explained to her that the Qs are only about gathering information, not for changing the outcome.  I got the sense that she was not especially engaged with the process when doing it on her own, i.e., all she could think of in response to Q1 was, "I want that chocolate bar." Below are her responses to Qs I asked today re. yesterday's chocolate bar:  1. What AM I feeling now?   I want that bar, but I shouldn't want it, but I do; guilt; out of control, compelled; guilty; desperate; embarrassed; hopeless; fearful of wt gain; something wrong w/ me; glad; delighted; happy; nurtured; secure. 2. How do I WANT to feel?   Normal; have control over chocolate. 3. What do I really NEED right now? (Nothing satisfies as much as chocolate.)  Fun; comfort.  24-hr recall:  (Up at 10:30 AM) B (11 AM)-  2 scrmbled eggs, 2 sausage patties, 2 small croissants, Splenda tea Snk (12 PM)-  Pro shake L (1:30 PM)-  Splenda tea Snk (3 PM)-  Protein shake Snk (4:30 PM) 1-oz choc bar D (7 PM)-  [place] tkout: Pepper stk, 3/4 c white rice, 1 egg roll, Splenda tea Snk ( PM)-  none   Progress Towards Goal(s):  In progress.   Nutritional Diagnosis:  Stable progress noted on NB-2.1 Physical inactivity As related to continued recovery from surgery.  As evidenced by restarting [employer] exercise program 3 X wk and intentional increased activity at home.    Intervention:  Nutrition education.  Monitoring/Evaluation:  Dietary intake, exercise, and body weight in 1 week(s).

## 2013-04-16 ENCOUNTER — Ambulatory Visit (INDEPENDENT_AMBULATORY_CARE_PROVIDER_SITE_OTHER): Payer: 59 | Admitting: Family Medicine

## 2013-04-16 ENCOUNTER — Encounter: Payer: Self-pay | Admitting: Family Medicine

## 2013-04-16 DIAGNOSIS — E119 Type 2 diabetes mellitus without complications: Secondary | ICD-10-CM

## 2013-04-16 NOTE — Progress Notes (Signed)
Patient ID: [name], female   DOB: October 13, 1950, 63 y.o.   MRN: [id number] ATTENDING PHYSICIAN NOTE: I have reviewed the chart and agree with the plan as detailed above. Dorcas Mcmurray MD Pager [phone]

## 2013-04-16 NOTE — Patient Instructions (Signed)
-   Next time you are struggling with a food craving, ask yourself, and write down:   1. What AM I feeling now?   2. How do I WANT to feel?   3. What do I really NEED right now?  Write these 3 Qs on a few 3X5 cards, and write your answers on the cards.  Sit down for this process.  Ask yourself after each Q, "Is there anything else?"   - Re-read any of G Roth's books, and take notes! - Food recommendations: Protein at each meal; Vegetables twice a day.

## 2013-04-16 NOTE — Progress Notes (Signed)
Medical Nutrition Therapy:  Appt start time: 1330 end time:  1430.  Assessment:  Primary concerns today: Weight management and Blood sugar control.  [name]'s food cravings have continued.  She has done better controlling her intake last week, although she had a few pitfalls.  She saw the physician that did her skin graft last week, so will see him again Jan 21, when he will let her know if she will do a second graft or will need to cut out some of the mesh.  This has been very upsetting to [name], and she has been feeling especially anxious since then. She answered the Decoding Qs a few times, but did not write them down.  Answers she did give were more thoughts than feelings.  We discussed the process again, and why I am asking her to do this.  She agreed to try again.   BG has been high at times, i.e., 200-220, which [name] has not been able to connect with specific food or eating behaviors.  Not sure if these increased BG levels are stress-related.     Intake so far today:  (Up at  AM) B ( AM)-  2 X protein shakes, 1 before, 1 after ex Snk (8 AM)-  Coffee w/ milk Splenda Snk (11 AM)-  3 X pb & j sandwich  L ( PM)-   Snk ( PM)-   D ( PM)-   Snk ( PM)-     Progress Towards Goal(s):  In progress.   Nutritional Diagnosis:  Stable progress noted on NB-2.1 Physical inactivity As related to continued recovery from surgery.  As evidenced by restarting [employer] exercise program 3 X wk and intentional increased activity at home.    Intervention:  Nutrition education.  Monitoring/Evaluation:  Dietary intake, exercise, and body weight in 1 week(s).

## 2013-04-23 ENCOUNTER — Ambulatory Visit: Payer: 59 | Admitting: Family Medicine

## 2013-04-30 ENCOUNTER — Ambulatory Visit (INDEPENDENT_AMBULATORY_CARE_PROVIDER_SITE_OTHER): Payer: 59 | Admitting: Family Medicine

## 2013-04-30 ENCOUNTER — Encounter: Payer: Self-pay | Admitting: Family Medicine

## 2013-04-30 DIAGNOSIS — E119 Type 2 diabetes mellitus without complications: Secondary | ICD-10-CM

## 2013-04-30 NOTE — Patient Instructions (Addendum)
-   GOAL: Write down your BG readings each time.    - Include food record (what time, what food, how much) at least most days, but especially when you have a high reading.   - GOAL:  Whenever possible, walk at least 15 min following each meal.    - Record each time you do this.  - GOAL (dealing with anxiety):  Spend at least 10 minutes writing daily.  Use any topic or prompt, i.e.,   - I am from...  - I just love...  - I don't understand...  - What I want to do when I grow up...  - Some of the strengths my family gave me...  - Some of the obstacles I currently face are...  - NO EDITING; JUST WRITE.  Bring to follow-up, knowing these are for your eyes only.

## 2013-04-30 NOTE — Progress Notes (Signed)
Medical Nutrition Therapy:  Appt start time: 1330 end time:  1430.  Assessment:  Primary concerns today: Weight management and Blood sugar control.  [name]'s BG have still not been in good control.  FBG this AM was 264, and 109 after exercising at [facility] 6:30-7:30 AM.  She has decided to measure BG 6 X day, and to aim for keeping it <155.   She tried answering the 3 decoding Qs, but she said she cannot answer these Qs right now; too much anxiety related to financial concerns and extended family issues at this time.  In addn, she is considering working again, and has applied for one job so far for which she has a scheduled interview.    24-hr recall:  (Up at 9 AM) B (9:30 AM)-  Protein shake Snk (11 AM)-  2 fried eggs, 1 sausage link, 1 slc bacon, 1 pc toast w/ 1/2 tsp jam, 12 oz sk milk Drove to [place] 2 hrs; went to a funeral of a high school classmate. L ( PM)-  none Snk (2, 8PM)-  2 pkg's M&Ms D (9:30PM)-  2-2 1/2 c Taco soup (beans, hmbgr, corn, pasta), 1 oz chs, hpng tbsp sour cream, Splenda tea Snk ( PM)-  none  Progress Towards Goal(s):  In progress.   Nutritional Diagnosis:  Stable progress noted on NB-2.1 Physical inactivity As related to continued recovery from surgery.  As evidenced by restarting [employer] exercise program 3 X wk and intentional increased activity at home.    Intervention:  Nutrition education.  Monitoring/Evaluation:  Dietary intake, exercise, and body weight in 1 week(s).

## 2013-05-01 ENCOUNTER — Other Ambulatory Visit: Payer: Self-pay

## 2013-05-01 DIAGNOSIS — Z1231 Encounter for screening mammogram for malignant neoplasm of breast: Secondary | ICD-10-CM

## 2013-05-02 ENCOUNTER — Ambulatory Visit
Admission: RE | Admit: 2013-05-02 | Discharge: 2013-05-02 | Disposition: A | Discharge status: Home / Self Care | Payer: PRIVATE HEALTH INSURANCE | Source: Ambulatory Visit

## 2013-05-02 DIAGNOSIS — Z1231 Encounter for screening mammogram for malignant neoplasm of breast: Secondary | ICD-10-CM

## 2013-05-07 ENCOUNTER — Encounter: Payer: Self-pay | Admitting: Family Medicine

## 2013-05-07 ENCOUNTER — Ambulatory Visit (INDEPENDENT_AMBULATORY_CARE_PROVIDER_SITE_OTHER): Payer: 59 | Admitting: Family Medicine

## 2013-05-07 DIAGNOSIS — E119 Type 2 diabetes mellitus without complications: Secondary | ICD-10-CM

## 2013-05-07 NOTE — Patient Instructions (Signed)
-   Continue to journal, as well as writing your BG logs, food record, and exercise.   - Next time you are struggling with a food craving, ask yourself, and write down:   1. What AM I feeling now?   2. How do I WANT to feel?   3. What do I really NEED right now?   Write these 3 Qs on a few 3X5 cards, and write your answers on the cards.   Sit down for this process.   Ask yourself after each Q, "Is there anything else?"

## 2013-05-07 NOTE — Progress Notes (Signed)
Medical Nutrition Therapy:  Appt start time: 1330 end time:  1430.  Assessment:  Primary concerns today: Weight management and Blood sugar control.  [name] is still going to the HOPE exercise program at [facility], and she has started walking more.  Despite greater efforts at tighter food control, her BG have still been high, so she is now using 30 u of Lantus.  She was functional-fitness tested at [facility] last week, and will get a program based on her evaluation this week.     [name] still had a lot of hunger and cravings last week.  She made a strong effort to improve her food choices, confirmed by today's weight, which showed a loss of 2.5 lb.   [name] tried to answer the 3 Decoding Qs several times last week, but realized that she finds it very difficult to identify her feelings.  She did not write any answers down, but agreed to try this process again this week.    Progress Towards Goal(s):  In progress.   Nutritional Diagnosis:  Stable progress noted on NB-2.1 Physical inactivity As related to continued recovery from surgery.  As evidenced by restarting [employer] exercise program 3 X wk and intentional increased activity at home.    Intervention:  Nutrition education.  Monitoring/Evaluation:  Dietary intake, exercise, and body weight in 1 week(s).

## 2013-05-14 ENCOUNTER — Ambulatory Visit: Payer: 59 | Admitting: Family Medicine

## 2013-05-21 ENCOUNTER — Ambulatory Visit: Payer: 59 | Admitting: Family Medicine

## 2013-05-28 ENCOUNTER — Ambulatory Visit (INDEPENDENT_AMBULATORY_CARE_PROVIDER_SITE_OTHER): Payer: 59 | Admitting: Internal Medicine

## 2013-05-28 ENCOUNTER — Ambulatory Visit (INDEPENDENT_AMBULATORY_CARE_PROVIDER_SITE_OTHER): Payer: 59 | Admitting: Family Medicine

## 2013-05-28 ENCOUNTER — Encounter: Payer: Self-pay | Admitting: Internal Medicine

## 2013-05-28 ENCOUNTER — Encounter: Payer: Self-pay | Admitting: Family Medicine

## 2013-05-28 VITALS — BP 130/64 | HR 94 | Ht 68.0 in | Wt 331.0 lb

## 2013-05-28 DIAGNOSIS — E119 Type 2 diabetes mellitus without complications: Secondary | ICD-10-CM

## 2013-05-28 DIAGNOSIS — G4733 Obstructive sleep apnea (adult) (pediatric): Secondary | ICD-10-CM

## 2013-05-28 NOTE — Patient Instructions (Signed)
[name]'s Rules:  - Eat within first hour of getting up, and at regular, consistent times through the day (never going more than 5 hr without eating).   - Vegetables with both L & D.    - Sweets:  Limit to 3 bites per day.    - Sweets around the house:  Try to have mostly those that [employer].    - Pre-package small portions of ....   - Keep sweets out of sight.     - Make a list of what else you can do to help limit sweets.    [name]:  No matter what you have eaten in the last 24 hours, 24 days, or 24 years, eat the very next time you feel hungry.     - Mindful, conscious choices.
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
## 2013-05-28 NOTE — Patient Instructions (Signed)
We can continue CPAP 15/ Advanced  Please call as needed

## 2013-05-28 NOTE — Progress Notes (Signed)
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 Has noticed she gets sleepy during the day(in hot room). She remains fully compliant with CPAP 12 CWP/Advanced, nasal mask. She has had lap band surgery and is working on weight loss so that she can have abdominal hernia repairs by a Psychologist, sport and exercise in [place]. She is planning to retire so that she can devote full attention to her weight loss effort. 2 cups of coffee in the morning. Admits sleepiness sitting in warm room.  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  Using CPAP reliably. No special needs expressed. Has not been able to lose weight Does complain of perennial mucoid rhinorrhea which is bothersome, without sneeze or headache.  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 Much better with CPAP, good compliance, well rested since pressure increased to 15 on 12/10. Slowly healing incision needed skin graft after hernia repair.  ROS-see HPI Constitutional:   Limited  weight loss, No- night sweats, fevers, chills, fatigue, lassitude. HEENT:   No-  headaches, difficulty swallowing, tooth/dental problems, sore throat,       No-  sneezing, itching, ear ache, nasal congestion, +post nasal drip,  CV:  No-   chest pain, orthopnea, PND, swelling in lower extremities, anasarca,  dizziness, palpitations Resp: No- acute  shortness of breath with exertion or at rest.              No-   productive cough,  No non-productive cough,  No- coughing up of blood.              No-   change in color of mucus.  No- wheezing.   Skin: No-   rash  or lesions. GI:  No-   heartburn, indigestion, abdominal pain, nausea, vomiting,  GU: MS:  No-   joint pain or swelling.   Neuro-     nothing unusual Psych:  No- change in mood or affect. No depression or anxiety.  No memory loss.  OBJ- Physical Exam General- Alert, Oriented, Affect-appropriate, Distress- none acute, morbid obesity Skin- rash-none, lesions- none, excoriation- none Lymphadenopathy- none Head- atraumatic            Eyes- Gross vision intact, PERRLA, conjunctivae and secretions clear            Ears- Hearing, canals-normal            Nose- Clear, no-Septal dev, No-polyps, erosion, perforation             Throat- Mallampati IV , mucosa clear , drainage- none, tonsils- atrophic Neck- flexible , trachea midline, no stridor , thyroid nl, carotid no bruit Chest - symmetrical excursion , unlabored           Heart/CV- RRR , 1/6 AS murmur , no gallop  , no rub, nl s1 s2                           - JVD- none , edema- none, stasis changes- none, varices- none           Lung- clear to P&A, wheeze- none,  cough- none , dullness-none, rub- none           Chest wall-  Abd-  Br/ Gen/ Rectal- Not done, not indicated Extrem- cyanosis- none, clubbing, none, atrophy- none, strength- nl Neuro- grossly intact to observation

## 2013-06-04 ENCOUNTER — Encounter: Payer: Self-pay | Admitting: Family Medicine

## 2013-06-04 ENCOUNTER — Ambulatory Visit (INDEPENDENT_AMBULATORY_CARE_PROVIDER_SITE_OTHER): Payer: 59 | Admitting: Family Medicine

## 2013-06-04 DIAGNOSIS — E119 Type 2 diabetes mellitus without complications: Secondary | ICD-10-CM

## 2013-06-04 NOTE — Patient Instructions (Signed)
-   You will want to establish a routine that includes consistent exercise AS SOON AS you start work in [place].    - Walk until you can incorporate other exercise.    - Your first week, plan on lunches that you can eat with one hand, in case you end up walking at lunch time.   - Plan ahead for weekday lunches:    - Yellow squash and any meat  - Salad with tuna/chicken/beans  - Egg salad sandwich on sandwich thins with vegetables  - Soup (with protein component) - Email [name] no later than Mar 25.   - Food goals:  - Eat within first hour of getting up, and at regular, consistent times through the day (never going more than 5 hr without eating).   - Vegetables with both L & D.  - No eating in the car; keep a water bottle in the car.

## 2013-06-04 NOTE — Progress Notes (Signed)
Medical Nutrition Therapy:  Appt start time: 1330 end time:  1430.  Assessment:  Primary concerns today: Weight management and Blood sugar control.  [name] has decided to take the job in [place].  She is feeling a lot of stress b/c of all that needs to be done before work starts on Monday.  This has impacted her eating this past week, i.e., snacking while working at the computer.  Her weight is up 3 lb in one week.  She has continued to exercise in the [facility] program, but she does not yet know what fitness resources will be available in [place].  [name] will be unable to follow up with MNT visits now that she will be in [place] M-F, but we agreed to stay in touch by email.    [name] is also concerned that her wound is still not healing; she sees the [facility] physician tomorrow for evaluation.    Progress Towards Goal(s):  In progress.   Nutritional Diagnosis:  Good progress noted on NB-2.1 Physical inactivity As related to continued recovery from surgery.  As evidenced by [facility] exercise program 3 X wk and additional walking and exercises at home.    Intervention:  Nutrition education.  Monitoring/Evaluation:  Dietary intake, exercise, and body weight prn.

## 2013-06-08 ENCOUNTER — Ambulatory Visit (INDEPENDENT_AMBULATORY_CARE_PROVIDER_SITE_OTHER): Payer: Self-pay | Admitting: Family Medicine

## 2013-06-08 VITALS — BP 145/82 | HR 90 | Ht 68.0 in | Wt 325.0 lb

## 2013-06-08 DIAGNOSIS — E119 Type 2 diabetes mellitus without complications: Secondary | ICD-10-CM

## 2013-06-08 NOTE — Progress Notes (Signed)
Subjective:  Patient presents today for 3 month diabetes follow-up as part of the employer-sponsored Link to Wellness program. Current diabetes regimen includes Lantus 40 units once daily, Novolog sliding scale with breakfast and lunch. Patient also continues on daily ASA, ARB, and statin.   Patient states that she hasn't been doing as well since her last visit with me. She has gained weight and is up 325 pounds. The skin graft on her belly still hasn't healed yet. She is still seeing the Psychiatric nurse in [place]. She states that parts of the graft have healed, but there are meandering lines of wound that haven't healed. She estimates that there is 10% left to heal. She is still applying wet to dry dressings twice daily.   She has a pending appointment with Dr. Forde Dandy in May. She states that CBGs have been elevated. She has increased both her Novolog and Lantus dose. She states that she is up to around 40 units of Lantus and 30-40 units Novolog (total daily dose). Her last appointment with Dr. Forde Dandy was in January.    Disease Assessments:  Diabetes:  Type of Diabetes: Type 2; Year of diagnosis 2003; Sees Diabetes provider 3 times per year; MD managing Diabetes Dr. Forde Dandy; checks blood glucose 2 times a day; uses glucometer; takes medications as prescribed; takes an aspirin a day; checks feet rarely; Diabetes Education Patient is a pharmacist; hypoglycemia frequency rarely;   Highest CBG 283; Lowest CBG 124; 7 day CBG average 148; 14 day CBG average 154; 30 day CBG average 150; What is target A1c? 7 %;   Other Diabetes History: Her last A1C with Dr. Forde Dandy was 5.9% in January. I downloaded patient's meter to the [employer] software.   She is checking CBGs 2-3 times daily. Fasting and again at bedtime. She is taking Novolog sliding scale with breakfast and with dinner, but not at lunch. She is eating several small meals throughout the day. She states that in the morning her numbers are still high.  Fasting CBGs are around 162 and bedtime averages are 191. I explained to patient that without a CBG check and Novolog dose in the mid-day for lunch, she is having to play catch up with the dinner dose of Novolog. She is frustrated that her CBGs have been elevated.   I encourgaed patient to start checking blood sugar with the biggest mid-day meal and to cover this meal with Novolog. I explained that as patient will no longer be participating in the same amount of physical activity that she was before (since she is not swimming or doing the [place] program anymore) her insulin requirements may increase. Patient questioned whether she needed to also titrate Lantus. I encouraged her to start with the mid-day dose of Novolog and see what her CBGs are running before she starts increasing Lantus dose.   I sent a fax to Dr. Forde Dandy giving them an update on her Lantus usage, fasting CBGs and requested a new prescription to reflect the change in dose. She has an appointment pending with Dr. Forde Dandy in May. I will defer A1C testing to that visit, but I assume that A1C will be elevated.   Lantus 40 units in the morning, Novolog sliding scale with breakfast and dinner.    Physical Activity-  Today was the last day of the [place] program at [facility]. She is moving to [place] this weekend to   start a new job and won't be able to continue the [employer]. She wants to  be able to find a gym close to either her job or her new home so that she can continue to exercise. She states that she could walk around her farm also in the evenings.   Nutrition- seeing Iver Nestle, RD for dietary management, but as she is moving to [place] she won't be able to see [name] anymore.   Because of the long term antibiotic use she has developed intense food cravings.   She feels like she is eating more. She has increased protein consumption to help with wound healing.     Vital Signs:  06/08/2013 5:05 PM (EST) Blood Pressure 145 / 82  mm/HgBMI 49.4; Height 5 ft 8 in; Pulse Rate 90 bpm; Weight 325 lbs    Testing:  Blood Sugar Tests:  Hemoglobin A1c: 5.9 patient recall from Dr. Baldwin Crown office resulted on 04/05/2013    Assessment/Plan: Patient is a 63 year old female with DM2. Most recent A1C in January of 5.9% was at goal of less than 7%. However, based on CBG readings from patient's blood glucose meter, I anticipate that A1C has increased, as averages are running >160 mg/dL.   Patient has regained additional weight and is now up to 325 pounds. She is frustrated because she has had to increase insulin usage and CBGs are not where she wants them to be. Part of this increase I believe is because of the weight gain and increase in insulin resistance. Given that physical activity will likely decrease further as patient starts a new job in [place] next week, insulin requirements may further increase. I encouraged her to find a new gym either close to her work or close to her new home in [place] so that she can continue physical activity. I reminded patient that one of the reasons she was able to drastically reduce Lantus and Novolog doses last year was because she was losing weight and was exercising >500 minutes a week.   Patient will no longer carry the [facility] insurance as of the end of the month and will no longer qualify for the Link to [employer], so sadly, I will not follow up with patient..    Goals for Next Visit-  1. Find a new gym in [place] or in [place] and start going.  2. Walk in the evenings around the farm.  3. Start checking blood sugar before your largest mid-day meal/snack and start giving Novolog doses with that meal.

## 2013-06-11 ENCOUNTER — Ambulatory Visit: Payer: 59 | Admitting: Family Medicine

## 2013-06-18 ENCOUNTER — Ambulatory Visit: Payer: 59 | Admitting: Family Medicine

## 2013-06-21 ENCOUNTER — Telehealth: Payer: Self-pay | Admitting: Internal Medicine

## 2013-06-21 DIAGNOSIS — G4733 Obstructive sleep apnea (adult) (pediatric): Secondary | ICD-10-CM

## 2013-06-21 NOTE — Telephone Encounter (Signed)
Called and spoke with pt and she stated that she will need the humidifier for her cpap.  She has lost the humidifier part of her cpap while she was traveling.  CY please advise if ok to send this in to [facility] for the pt.  Thanks  Last ov--05/28/2013 Next ov--05/31/2014  Allergies  Allergen Reactions  . Oxaprozin Other (See Comments)    Causes skin to slough off.  . Ampicillin Itching and Rash    Starts on torso and hands. Spreads up to neck.  . Codeine Itching and Rash    Starts at trunk and hands. Spreads up to the neck.  Elyse Hsu [Shellfish Allergy] Hives and Itching    Current Outpatient Prescriptions on File Prior to Visit  Medication Sig Dispense Refill  . aspirin 81 MG tablet Take 81 mg by mouth daily.        Marland Kitchen atorvastatin (LIPITOR) 10 MG tablet Take 10 mg by mouth daily.      Marland Kitchen CALCIUM-MAGNESUIUM-ZINC 333-133-8.3 MG TABS Take 1 tablet by mouth daily.      . Cholecalciferol (VITAMIN D3) 10000 UNITS capsule Take 10,000 Units by mouth daily.      Marland Kitchen docusate sodium (COLACE) 100 MG capsule Take 100 mg by mouth 3 (three) times daily.       Marland Kitchen escitalopram (LEXAPRO) 10 MG tablet Take 10 mg by mouth daily.      . insulin aspart (NOVOLOG) 100 UNIT/ML injection Inject into the skin as needed. 0-15 units 1-2 X day. Sliding scale      . Insulin Glargine (LANTUS SOLOSTAR) 100 UNIT/ML SOPN Inject 40 Units into the skin 1 day or 1 dose.       . levothyroxine (SYNTHROID, LEVOTHROID) 200 MCG tablet Take 200 mcg by mouth daily.        . metFORMIN (GLUCOPHAGE) 500 MG tablet Take 500 mg by mouth 2 (two) times daily with a meal.       . Multiple Vitamin (MULTIVITAMIN) tablet Take 1 tablet by mouth daily.      . pantoprazole (PROTONIX) 40 MG tablet Take 40 mg by mouth daily.      . Phentermine-Topiramate (QSYMIA) 7.5-46 MG CP24 Take 1 capsule by mouth daily.      . Probiotic Product (ALIGN) 4 MG CAPS Take 4 mg by mouth 1 day or 1 dose.      . valsartan-hydrochlorothiazide (DIOVAN HCT) 320-25 MG per  tablet Take 1 tablet by mouth daily.       No current facility-administered medications on file prior to visit.

## 2013-06-21 NOTE — Telephone Encounter (Signed)
Ok order DME [facility]- replacement humidifier for CPAP     Dx OSA

## 2013-06-21 NOTE — Telephone Encounter (Signed)
Order has been placed for the pt to get the replacement humidifier for her cpap.

## 2013-06-22 NOTE — Assessment & Plan Note (Signed)
She keeps trying,

## 2013-06-22 NOTE — Assessment & Plan Note (Signed)
Good compliance and control now at 15.

## 2013-06-25 ENCOUNTER — Ambulatory Visit: Payer: 59 | Admitting: Family Medicine

## 2013-07-02 ENCOUNTER — Ambulatory Visit: Payer: 59 | Admitting: Family Medicine

## 2013-07-30 NOTE — Progress Notes (Signed)
Patient ID: [name], female   DOB: 06/10/1950, 62 y.o.   MRN: [id number] ATTENDING PHYSICIAN NOTE: I have reviewed the chart and agree with the plan as detailed above. Diquan Kassis MD Pager [phone]  

## 2013-07-30 NOTE — Progress Notes (Signed)
Patient ID: THI KLICH, female   DOB: [birth year], 63 y.o.   MRN: [id number] ATTENDING PHYSICIAN NOTE: I have reviewed the chart and agree with the plan as detailed above. Dorcas Mcmurray MD Pager [phone]

## 2013-08-10 ENCOUNTER — Other Ambulatory Visit: Payer: Self-pay | Admitting: Endocrinology

## 2013-08-10 DIAGNOSIS — E041 Nontoxic single thyroid nodule: Secondary | ICD-10-CM

## 2013-08-13 ENCOUNTER — Other Ambulatory Visit: Payer: 59

## 2013-09-03 ENCOUNTER — Other Ambulatory Visit: Payer: 59

## 2013-10-15 ENCOUNTER — Ambulatory Visit
Admission: RE | Admit: 2013-10-15 | Discharge: 2013-10-15 | Disposition: A | Discharge status: Home / Self Care | Payer: PRIVATE HEALTH INSURANCE | Source: Ambulatory Visit | Attending: Endocrinology | Admitting: Endocrinology

## 2013-10-15 ENCOUNTER — Ambulatory Visit
Admission: RE | Admit: 2013-10-15 | Discharge: 2013-10-15 | Disposition: A | Discharge status: Home / Self Care | Payer: Medicare Other | Source: Ambulatory Visit | Attending: Endocrinology | Admitting: Endocrinology

## 2013-10-15 DIAGNOSIS — M47817 Spondylosis without myelopathy or radiculopathy, lumbosacral region: Secondary | ICD-10-CM | POA: Diagnosis not present

## 2013-10-15 DIAGNOSIS — M4310 Spondylolisthesis, site unspecified: Secondary | ICD-10-CM | POA: Diagnosis not present

## 2013-10-15 DIAGNOSIS — E041 Nontoxic single thyroid nodule: Secondary | ICD-10-CM

## 2013-10-15 DIAGNOSIS — M5137 Other intervertebral disc degeneration, lumbosacral region: Secondary | ICD-10-CM | POA: Diagnosis not present

## 2013-10-17 DIAGNOSIS — T8140XA Infection following a procedure, unspecified, initial encounter: Secondary | ICD-10-CM | POA: Diagnosis not present

## 2013-10-17 DIAGNOSIS — K439 Ventral hernia without obstruction or gangrene: Secondary | ICD-10-CM | POA: Diagnosis not present

## 2013-10-17 DIAGNOSIS — IMO0002 Reserved for concepts with insufficient information to code with codable children: Secondary | ICD-10-CM | POA: Diagnosis not present

## 2013-11-16 DIAGNOSIS — D649 Anemia, unspecified: Secondary | ICD-10-CM | POA: Diagnosis not present

## 2013-11-16 DIAGNOSIS — I10 Essential (primary) hypertension: Secondary | ICD-10-CM | POA: Diagnosis not present

## 2013-11-16 DIAGNOSIS — E559 Vitamin D deficiency, unspecified: Secondary | ICD-10-CM | POA: Diagnosis not present

## 2013-11-16 DIAGNOSIS — E041 Nontoxic single thyroid nodule: Secondary | ICD-10-CM | POA: Diagnosis not present

## 2013-11-16 DIAGNOSIS — E785 Hyperlipidemia, unspecified: Secondary | ICD-10-CM | POA: Diagnosis not present

## 2013-11-16 DIAGNOSIS — E1142 Type 2 diabetes mellitus with diabetic polyneuropathy: Secondary | ICD-10-CM | POA: Diagnosis not present

## 2013-11-16 DIAGNOSIS — T148XXA Other injury of unspecified body region, initial encounter: Secondary | ICD-10-CM | POA: Diagnosis not present

## 2013-11-16 DIAGNOSIS — E1149 Type 2 diabetes mellitus with other diabetic neurological complication: Secondary | ICD-10-CM | POA: Diagnosis not present

## 2013-11-16 DIAGNOSIS — E349 Endocrine disorder, unspecified: Secondary | ICD-10-CM | POA: Diagnosis not present

## 2013-12-06 ENCOUNTER — Encounter: Payer: Self-pay | Admitting: Family Medicine

## 2013-12-06 NOTE — Progress Notes (Signed)
Patient ID: [name], female   DOB: 26-Sep-1950, 62 y.o.   MRN: [id number] Reviewed: Agree with the documentation and management of our [place].

## 2014-01-15 DIAGNOSIS — E039 Hypothyroidism, unspecified: Secondary | ICD-10-CM | POA: Diagnosis not present

## 2014-01-15 DIAGNOSIS — E119 Type 2 diabetes mellitus without complications: Secondary | ICD-10-CM | POA: Diagnosis not present

## 2014-01-15 DIAGNOSIS — R635 Abnormal weight gain: Secondary | ICD-10-CM | POA: Diagnosis not present

## 2014-01-15 DIAGNOSIS — I10 Essential (primary) hypertension: Secondary | ICD-10-CM | POA: Diagnosis not present

## 2014-02-21 ENCOUNTER — Other Ambulatory Visit (HOSPITAL_COMMUNITY)
Admission: RE | Admit: 2014-02-21 | Discharge: 2014-02-21 | Disposition: A | Discharge status: Home / Self Care | Payer: Medicare Other | Source: Ambulatory Visit | Attending: Gynecologic Oncology | Admitting: Gynecologic Oncology

## 2014-02-21 ENCOUNTER — Ambulatory Visit: Payer: Medicare Other | Attending: Gynecologic Oncology | Admitting: Gynecologic Oncology

## 2014-02-21 ENCOUNTER — Encounter: Payer: Self-pay | Admitting: Gynecologic Oncology

## 2014-02-21 VITALS — BP 152/58 | HR 84 | Temp 98.5°F | Resp 20 | Ht 68.0 in | Wt 357.6 lb

## 2014-02-21 DIAGNOSIS — Z8542 Personal history of malignant neoplasm of other parts of uterus: Secondary | ICD-10-CM | POA: Diagnosis not present

## 2014-02-21 DIAGNOSIS — Z7982 Long term (current) use of aspirin: Secondary | ICD-10-CM | POA: Insufficient documentation

## 2014-02-21 DIAGNOSIS — Z90710 Acquired absence of both cervix and uterus: Secondary | ICD-10-CM | POA: Insufficient documentation

## 2014-02-21 DIAGNOSIS — Z794 Long term (current) use of insulin: Secondary | ICD-10-CM | POA: Insufficient documentation

## 2014-02-21 DIAGNOSIS — I10 Essential (primary) hypertension: Secondary | ICD-10-CM | POA: Diagnosis not present

## 2014-02-21 DIAGNOSIS — Z124 Encounter for screening for malignant neoplasm of cervix: Secondary | ICD-10-CM | POA: Insufficient documentation

## 2014-02-21 DIAGNOSIS — E119 Type 2 diabetes mellitus without complications: Secondary | ICD-10-CM | POA: Insufficient documentation

## 2014-02-21 DIAGNOSIS — C73 Malignant neoplasm of thyroid gland: Secondary | ICD-10-CM | POA: Insufficient documentation

## 2014-02-21 DIAGNOSIS — Z79899 Other long term (current) drug therapy: Secondary | ICD-10-CM | POA: Insufficient documentation

## 2014-02-21 DIAGNOSIS — Z90722 Acquired absence of ovaries, bilateral: Secondary | ICD-10-CM | POA: Diagnosis not present

## 2014-02-21 DIAGNOSIS — K439 Ventral hernia without obstruction or gangrene: Secondary | ICD-10-CM | POA: Diagnosis not present

## 2014-02-21 DIAGNOSIS — E785 Hyperlipidemia, unspecified: Secondary | ICD-10-CM | POA: Diagnosis not present

## 2014-02-21 DIAGNOSIS — C541 Malignant neoplasm of endometrium: Secondary | ICD-10-CM | POA: Diagnosis not present

## 2014-02-21 DIAGNOSIS — Z9079 Acquired absence of other genital organ(s): Secondary | ICD-10-CM | POA: Diagnosis not present

## 2014-02-21 NOTE — Progress Notes (Signed)
Consult Note: Gyn-Onc  [name] 63 y.o. female  CC:  Chief Complaint  Patient presents with  . endometrial adenocarcinoma, grade 64    HPI: 63 year old with a history of a stage IA grade 1 endometrial carcinoma who underwent exploratory laparotomy TAH/BSO and repair of ventral hernia in August of 2008 with myself and Dr. Excell Seltzer. I last saw her in February of 2013 which time her exam was limited but was negative and her Pap smear was negative.  Interval History:  We have not seen her for 2-1/2 years. She states that she's been "through a lot" and forgot to make her appointments. She really has no GYN issues. She underwent hernia repair Baldo Ash 09/26/2012. Mesh was placed at the time of repair. She developed an infection and she still has not healed. She continues using Silvadene cream and has a large abdominal wound with dressing intact. She states she lost about 100 pounds from the time of the surgery but secondary to the antibiotics that she was on for 4 months and her inability to exercise she gained weight again. She is going back to [place] to be seen in the clinic by a bariatric internist and she is very hopeful that she'll be able to get some assistance with weight loss. She is exercising Monday Wednesdays and Fridays lifting weights, doing the recumbent bike, elliptical training, and stretching. She does this for about an hour. She was really able to lose weight previously using water aerobics but she cannot get in the water secondary to her abdominal wound. There apparently is no issue of a recurrent hernia. She is up-to-date on her mammogram. She is up-to-date on her colonoscopy and was told she did not need one for another 10 years. Her A1c which was in the range of 5 is now in the range of 6-8 due to the weight gain. She continues to be followed by Dr. Forde Dandy. There are no new medical problems and her family. Her husband had back surgery this summer and is continuing to have  issues recovering from that. She is no longer working at [employer]. 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  Review of Systems  Constitutional: Denies fever. Skin: Open abdominal wound Cardiovascular: No chest pain, shortness of breath, + LE dema  Pulmonary: No cough  Gastro Intestinal: No nausea, vomiting, constipation, or diarrhea reported. No bright red blood per rectum or change in bowel movement.  Genitourinary: No frequency, urgency, or dysuria.  Denies vaginal bleeding and discharge.  Musculoskeletal: + knee pain Psychology: Frustrated by weight gain   Current Meds:  Outpatient Encounter Prescriptions as of 02/21/2014  Medication Sig  . aspirin 81 MG tablet Take 81 mg by mouth daily.    Marland Kitchen atorvastatin (LIPITOR) 10 MG tablet Take 10 mg by mouth daily.  Marland Kitchen CALCIUM-MAGNESUIUM-ZINC 333-133-8.3 MG TABS Take 1 tablet by mouth daily.  . Cholecalciferol (VITAMIN D3) 10000 UNITS capsule Take 10,000 Units by mouth daily.  Marland Kitchen docusate sodium (COLACE) 100 MG capsule Take 100 mg by mouth 3 (three) times daily.   Marland Kitchen escitalopram (LEXAPRO) 10 MG tablet Take 10 mg by mouth daily.  Marland Kitchen HUMALOG KWIKPEN 100 UNIT/ML KiwkPen Inject into the skin as needed.   . Insulin Glargine (LANTUS SOLOSTAR) 100 UNIT/ML SOPN Inject 25-35 Units into the skin 1 day or 1 dose.   . levothyroxine (SYNTHROID, LEVOTHROID) 200 MCG tablet Take 200 mcg by mouth daily.    . metFORMIN (GLUCOPHAGE) 500 MG tablet Take 1,000 mg by mouth  2 (two) times daily with a meal.   . Multiple Vitamin (MULTIVITAMIN) tablet Take 1 tablet by mouth daily.  . ONE TOUCH ULTRA TEST test strip   . pantoprazole (PROTONIX) 40 MG tablet Take 40 mg by mouth daily.  . Phentermine-Topiramate (QSYMIA) 7.5-46 MG CP24 Take 1 capsule by mouth daily.  . Probiotic Product (ALIGN) 4 MG CAPS Take 4 mg by mouth 1 day or 1 dose.  Marland Kitchen SSD 1 % cream Apply 1 application topically daily.   . TRULICITY 3.26 ZT/2.4PY SOPN   . UNIFINE PENTIPS 31G X 5 MM MISC   .  valsartan-hydrochlorothiazide (DIOVAN HCT) 320-25 MG per tablet Take 1 tablet by mouth daily.  . [DISCONTINUED] insulin aspart (NOVOLOG) 100 UNIT/ML injection Inject into the skin as needed. 0-15 units 1-2 X day. Sliding scale    Allergy:  Allergies  Allergen Reactions  . Oxaprozin Other (See Comments)    Causes skin to slough off.  . Ampicillin Itching and Rash    Starts on torso and hands. Spreads up to neck.  . Codeine Itching and Rash    Starts at trunk and hands. Spreads up to the neck.  Elyse Hsu [Shellfish Allergy] Hives and Itching    Social Hx:   History   Social History  . Marital Status: Married    Spouse Name: N/A    Number of Children: N/A  . Years of Education: N/A   Occupational History  . Not on file.   Social History Main Topics  . Smoking status: Never Smoker   . Smokeless tobacco: Never Used  . Alcohol Use: No  . Drug Use: No  . Sexual Activity: Not on file   Other Topics Concern  . Not on file   Social History Narrative    Past Surgical Hx:  Past Surgical History  Procedure Laterality Date  . Appendectomy    . Cesarean section    . Hernia repair    . Abdominal hysterectomy    . Gastric restriction surgery  04/04/2007    lap band. removed 09/28/12  . Tonsillectomy and adenoidectomy    . Hernia repair surgery      09/2012 with skin grafts    Past Medical Hx:  Past Medical History  Diagnosis Date  . Hypertension   . Thyroid disease   . Diabetes mellitus   . Hyperlipidemia   . Sinus complaint   . Wears glasses   . Joint pain   . Obesity   . Uterine cancer   . Thyroid cancer     Oncology Hx:    Endometrial ca   11/22/2006 Initial Diagnosis Endometrial ca, IA grade 1    Family Hx:  Family History  Problem Relation Age of Onset  . Heart disease Mother   . COPD Mother   . Colon polyps Mother   . Prostate cancer Father   . Colon polyps Father   . Diabetes Sister   . Asthma Sister   . Fibromyalgia Sister   . Colon cancer  Neg Hx   . Pancreatic cancer Neg Hx   . Stomach cancer Neg Hx     Vitals:  Blood pressure 152/58, pulse 84, temperature 98.5 F (36.9 C), temperature source Oral, resp. rate 20, height 5\' 8"  (1.727 m), weight 357 lb 9.6 oz (162.206 kg).  Physical Exam: Well-nourished well-developed female in no acute distress.  Neck: Supple, no lymphadenopathy no thyromegaly.  Lungs: Distant breath sounds. Clear to auscultation.  Cardiovascular: Regular rate and rhythm.  Abdomen: Morbidly obese. Large abdominal dressing. She has peau d'orange changes in the lower aspect of the abdomen. The dressing was not removed.  Groins: No lymphadenopathy.  Extremities: 1-2+ nonpitting edema with chronic venous stasis changes.  Pelvic: Normal female external genitalia. The vagina is atrophic. The vaginal cuff is visualized. There are no visible lesions. There is no bleeding, there is no discharge. Pap smear was submitted without difficulty. Bimanual examination is limited by habitus. There is no masses or nodularity. Rectal confirms.  Assessment/Plan:  63 year old with stage IA grade 1 endometrial carcinoma status post surgery in August 2008 was no evidence of recurrent disease. We will follow-up in results for Pap smear from today. She understands that she does not need to be seen by GYN oncology as she is greater than 5 years out from the time of her surgery. She'll continue to get care with a local gynecologist. She knows that we will follow-up in results from today we'll be happy to see her in the future should the need arise.  Nancy Marus A., MD 02/21/2014, 2:11 PM

## 2014-02-21 NOTE — Patient Instructions (Signed)
We will notify you of the results of your Pap smear. You may follow up with benign gynecology. You do not necessarily require a GYN oncologist for follow-up at this time. However, we'll be happy to see you in the future should the need arise. Happy holidays!

## 2014-02-26 ENCOUNTER — Telehealth: Payer: Self-pay | Admitting: Gynecologic Oncology

## 2014-02-26 DIAGNOSIS — E1149 Type 2 diabetes mellitus with other diabetic neurological complication: Secondary | ICD-10-CM | POA: Diagnosis not present

## 2014-02-26 DIAGNOSIS — E041 Nontoxic single thyroid nodule: Secondary | ICD-10-CM | POA: Diagnosis not present

## 2014-02-26 DIAGNOSIS — E1142 Type 2 diabetes mellitus with diabetic polyneuropathy: Secondary | ICD-10-CM | POA: Diagnosis not present

## 2014-02-26 DIAGNOSIS — Z23 Encounter for immunization: Secondary | ICD-10-CM | POA: Diagnosis not present

## 2014-02-26 DIAGNOSIS — G4730 Sleep apnea, unspecified: Secondary | ICD-10-CM | POA: Diagnosis not present

## 2014-02-26 DIAGNOSIS — E668 Other obesity: Secondary | ICD-10-CM | POA: Diagnosis not present

## 2014-02-26 DIAGNOSIS — E559 Vitamin D deficiency, unspecified: Secondary | ICD-10-CM | POA: Diagnosis not present

## 2014-02-26 DIAGNOSIS — C541 Malignant neoplasm of endometrium: Secondary | ICD-10-CM | POA: Diagnosis not present

## 2014-02-26 DIAGNOSIS — F329 Major depressive disorder, single episode, unspecified: Secondary | ICD-10-CM | POA: Diagnosis not present

## 2014-02-26 LAB — CYTOLOGY - PAP

## 2014-02-26 NOTE — Telephone Encounter (Signed)
Pt notified about pap results: negative.  No questions or concerns voiced. 

## 2014-03-20 DIAGNOSIS — S31109A Unspecified open wound of abdominal wall, unspecified quadrant without penetration into peritoneal cavity, initial encounter: Secondary | ICD-10-CM | POA: Diagnosis not present

## 2014-03-26 DIAGNOSIS — E119 Type 2 diabetes mellitus without complications: Secondary | ICD-10-CM | POA: Diagnosis not present

## 2014-04-25 DIAGNOSIS — H2513 Age-related nuclear cataract, bilateral: Secondary | ICD-10-CM | POA: Diagnosis not present

## 2014-04-25 DIAGNOSIS — H5203 Hypermetropia, bilateral: Secondary | ICD-10-CM | POA: Diagnosis not present

## 2014-04-25 DIAGNOSIS — H524 Presbyopia: Secondary | ICD-10-CM | POA: Diagnosis not present

## 2014-04-25 DIAGNOSIS — E119 Type 2 diabetes mellitus without complications: Secondary | ICD-10-CM | POA: Diagnosis not present

## 2014-05-15 DIAGNOSIS — R635 Abnormal weight gain: Secondary | ICD-10-CM | POA: Diagnosis not present

## 2014-05-15 DIAGNOSIS — I10 Essential (primary) hypertension: Secondary | ICD-10-CM | POA: Diagnosis not present

## 2014-05-15 DIAGNOSIS — E039 Hypothyroidism, unspecified: Secondary | ICD-10-CM | POA: Diagnosis not present

## 2014-05-15 DIAGNOSIS — E119 Type 2 diabetes mellitus without complications: Secondary | ICD-10-CM | POA: Diagnosis not present

## 2014-05-31 ENCOUNTER — Encounter: Payer: Self-pay | Admitting: Internal Medicine

## 2014-05-31 ENCOUNTER — Ambulatory Visit (INDEPENDENT_AMBULATORY_CARE_PROVIDER_SITE_OTHER): Payer: Medicare Other | Admitting: Internal Medicine

## 2014-05-31 VITALS — BP 124/74 | HR 87 | Ht 68.0 in | Wt 358.8 lb

## 2014-05-31 DIAGNOSIS — J300 Vasomotor rhinitis: Secondary | ICD-10-CM

## 2014-05-31 DIAGNOSIS — G4733 Obstructive sleep apnea (adult) (pediatric): Secondary | ICD-10-CM | POA: Diagnosis not present

## 2014-05-31 NOTE — Progress Notes (Signed)
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 Has noticed she gets sleepy during the day(in hot room). She remains fully compliant with CPAP 12 CWP/[facility], nasal mask. She has had lap band surgery and is working on weight loss so that she can have abdominal hernia repairs by a Psychologist, sport and exercise in [place]. She is planning to retire so that she can devote full attention to her weight loss effort. 2 cups of coffee in the morning. Admits sleepiness sitting in warm room.  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  Using CPAP reliably. No special needs expressed. Has not been able to lose weight Does complain of perennial mucoid rhinorrhea which is bothersome, without sneeze or headache.  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 Much better with CPAP, good compliance, well rested since pressure increased to 15 on 12/10. Slowly healing incision needed skin graft after hernia repair.  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 Pt needs order sent for DL as [facility] has no DL on file for [place]. Pt needs new Rx for new face mask. She describes her CPAP experience as "fabulous", used all night every night with dark benefit for her sleep quality.  ROS-see HPI Constitutional:   Limited  weight loss, No- night sweats, fevers, chills, fatigue, lassitude. HEENT:   No-  headaches, difficulty swallowing, tooth/dental problems, sore throat,       No-  sneezing,  itching, ear ache, nasal congestion, +post nasal drip,  CV:  No-   chest pain, orthopnea, PND, swelling in lower extremities, anasarca,  dizziness, palpitations Resp: No- acute  shortness of breath with exertion or at rest.              No-   productive cough,  No non-productive cough,  No- coughing up of blood.              No-   change in color of mucus.  No- wheezing.   Skin: No-   rash or lesions. GI:  No-   heartburn, indigestion, abdominal pain, nausea, vomiting,  GU: MS:  No-   joint pain or swelling.   Neuro-     nothing unusual Psych:  No- change in mood or affect. No depression or anxiety.  No memory loss.  OBJ- Physical Exam General- Alert, Oriented, Affect-appropriate, Distress- none acute, morbid obesity Skin- rash-none, lesions- none, excoriation- none Lymphadenopathy- none Head- atraumatic            Eyes- Gross vision intact, PERRLA, conjunctivae and secretions clear            Ears- Hearing, canals-normal            Nose- Clear, no-Septal dev, No-polyps, erosion, perforation             Throat- Mallampati IV , mucosa clear , drainage- none, tonsils- atrophic Neck- flexible , trachea midline, no stridor , thyroid nl, carotid no bruit Chest - symmetrical excursion , unlabored  Heart/CV- RRR , 1/6 AS murmur , no gallop  , no rub, nl s1 s2                           - JVD- none , edema- none, stasis changes- none, varices- none           Lung- clear to P&A, wheeze- none, cough- none , dullness-none, rub- none           Chest wall-  Abd-  Br/ Gen/ Rectal- Not done, not indicated Extrem- cyanosis- none, clubbing, none, atrophy- none, strength- nl Neuro- grossly intact to observation

## 2014-05-31 NOTE — Patient Instructions (Signed)
Order- [facility]- download/ [facility] pressure compliance   Dx OSA  Order- replacement CPAP mask of choice  Try using the humidifier and/ or an otc nasal saline gel for the nasal stuffiness  Take a look at [email or web address].com

## 2014-06-01 NOTE — Assessment & Plan Note (Signed)
We discussed use of her CPAP humidifier and nasal saline gel

## 2014-06-01 NOTE — Assessment & Plan Note (Signed)
The pressure is appropriate. We need to establish download access through her DME company for pressure compliance documentation and she needs a replacement mask. We discussed use of humidification to help her nasal stuffiness. She has not been using her humidifier.

## 2014-06-11 ENCOUNTER — Other Ambulatory Visit: Payer: Self-pay

## 2014-06-11 DIAGNOSIS — Z1231 Encounter for screening mammogram for malignant neoplasm of breast: Secondary | ICD-10-CM

## 2014-06-19 ENCOUNTER — Ambulatory Visit
Admission: RE | Admit: 2014-06-19 | Discharge: 2014-06-19 | Disposition: A | Discharge status: Home / Self Care | Payer: BLUE CROSS/BLUE SHIELD | Source: Ambulatory Visit

## 2014-06-19 DIAGNOSIS — Z1231 Encounter for screening mammogram for malignant neoplasm of breast: Secondary | ICD-10-CM

## 2014-07-02 DIAGNOSIS — G4730 Sleep apnea, unspecified: Secondary | ICD-10-CM | POA: Diagnosis not present

## 2014-07-02 DIAGNOSIS — E041 Nontoxic single thyroid nodule: Secondary | ICD-10-CM | POA: Diagnosis not present

## 2014-07-02 DIAGNOSIS — E785 Hyperlipidemia, unspecified: Secondary | ICD-10-CM | POA: Diagnosis not present

## 2014-07-02 DIAGNOSIS — E1149 Type 2 diabetes mellitus with other diabetic neurological complication: Secondary | ICD-10-CM | POA: Diagnosis not present

## 2014-07-02 DIAGNOSIS — D649 Anemia, unspecified: Secondary | ICD-10-CM | POA: Diagnosis not present

## 2014-07-02 DIAGNOSIS — Z6841 Body Mass Index (BMI) 40.0 and over, adult: Secondary | ICD-10-CM | POA: Diagnosis not present

## 2014-07-02 DIAGNOSIS — E039 Hypothyroidism, unspecified: Secondary | ICD-10-CM | POA: Diagnosis not present

## 2014-07-02 DIAGNOSIS — K469 Unspecified abdominal hernia without obstruction or gangrene: Secondary | ICD-10-CM | POA: Diagnosis not present

## 2014-07-02 DIAGNOSIS — E668 Other obesity: Secondary | ICD-10-CM | POA: Diagnosis not present

## 2014-07-02 DIAGNOSIS — I10 Essential (primary) hypertension: Secondary | ICD-10-CM | POA: Diagnosis not present

## 2014-07-02 DIAGNOSIS — E1142 Type 2 diabetes mellitus with diabetic polyneuropathy: Secondary | ICD-10-CM | POA: Diagnosis not present

## 2014-07-03 DIAGNOSIS — S2190XS Unspecified open wound of unspecified part of thorax, sequela: Secondary | ICD-10-CM | POA: Diagnosis not present

## 2014-07-30 ENCOUNTER — Encounter: Payer: Self-pay | Admitting: Internal Medicine

## 2014-08-28 DIAGNOSIS — S2190XS Unspecified open wound of unspecified part of thorax, sequela: Secondary | ICD-10-CM | POA: Diagnosis not present

## 2014-09-30 ENCOUNTER — Other Ambulatory Visit: Payer: Self-pay

## 2014-10-16 DIAGNOSIS — K439 Ventral hernia without obstruction or gangrene: Secondary | ICD-10-CM | POA: Diagnosis not present

## 2014-10-16 DIAGNOSIS — S31109A Unspecified open wound of abdominal wall, unspecified quadrant without penetration into peritoneal cavity, initial encounter: Secondary | ICD-10-CM | POA: Diagnosis not present

## 2014-11-04 DIAGNOSIS — E559 Vitamin D deficiency, unspecified: Secondary | ICD-10-CM | POA: Diagnosis not present

## 2014-11-04 DIAGNOSIS — E668 Other obesity: Secondary | ICD-10-CM | POA: Diagnosis not present

## 2014-11-04 DIAGNOSIS — T148 Other injury of unspecified body region: Secondary | ICD-10-CM | POA: Diagnosis not present

## 2014-11-04 DIAGNOSIS — E1149 Type 2 diabetes mellitus with other diabetic neurological complication: Secondary | ICD-10-CM | POA: Diagnosis not present

## 2014-11-04 DIAGNOSIS — F329 Major depressive disorder, single episode, unspecified: Secondary | ICD-10-CM | POA: Diagnosis not present

## 2014-11-04 DIAGNOSIS — G4730 Sleep apnea, unspecified: Secondary | ICD-10-CM | POA: Diagnosis not present

## 2014-11-04 DIAGNOSIS — E039 Hypothyroidism, unspecified: Secondary | ICD-10-CM | POA: Diagnosis not present

## 2014-11-04 DIAGNOSIS — Z6841 Body Mass Index (BMI) 40.0 and over, adult: Secondary | ICD-10-CM | POA: Diagnosis not present

## 2014-11-04 DIAGNOSIS — I10 Essential (primary) hypertension: Secondary | ICD-10-CM | POA: Diagnosis not present

## 2014-11-04 DIAGNOSIS — E1142 Type 2 diabetes mellitus with diabetic polyneuropathy: Secondary | ICD-10-CM | POA: Diagnosis not present

## 2014-11-04 DIAGNOSIS — E785 Hyperlipidemia, unspecified: Secondary | ICD-10-CM | POA: Diagnosis not present

## 2014-11-04 DIAGNOSIS — Z1389 Encounter for screening for other disorder: Secondary | ICD-10-CM | POA: Diagnosis not present

## 2014-11-04 DIAGNOSIS — C541 Malignant neoplasm of endometrium: Secondary | ICD-10-CM | POA: Diagnosis not present

## 2014-12-18 DIAGNOSIS — S31109A Unspecified open wound of abdominal wall, unspecified quadrant without penetration into peritoneal cavity, initial encounter: Secondary | ICD-10-CM | POA: Diagnosis not present

## 2015-01-08 ENCOUNTER — Encounter (HOSPITAL_BASED_OUTPATIENT_CLINIC_OR_DEPARTMENT_OTHER): Payer: Medicare Other | Attending: Surgery

## 2015-01-08 DIAGNOSIS — E1142 Type 2 diabetes mellitus with diabetic polyneuropathy: Secondary | ICD-10-CM | POA: Diagnosis not present

## 2015-01-08 DIAGNOSIS — T8189XA Other complications of procedures, not elsewhere classified, initial encounter: Secondary | ICD-10-CM | POA: Diagnosis not present

## 2015-01-08 DIAGNOSIS — E1140 Type 2 diabetes mellitus with diabetic neuropathy, unspecified: Secondary | ICD-10-CM | POA: Diagnosis not present

## 2015-01-08 DIAGNOSIS — Z794 Long term (current) use of insulin: Secondary | ICD-10-CM | POA: Diagnosis not present

## 2015-01-08 DIAGNOSIS — Y838 Other surgical procedures as the cause of abnormal reaction of the patient, or of later complication, without mention of misadventure at the time of the procedure: Secondary | ICD-10-CM | POA: Diagnosis not present

## 2015-01-08 DIAGNOSIS — I10 Essential (primary) hypertension: Secondary | ICD-10-CM | POA: Diagnosis not present

## 2015-01-08 DIAGNOSIS — S31105S Unspecified open wound of abdominal wall, periumbilic region without penetration into peritoneal cavity, sequela: Secondary | ICD-10-CM | POA: Diagnosis not present

## 2015-01-08 DIAGNOSIS — Z8542 Personal history of malignant neoplasm of other parts of uterus: Secondary | ICD-10-CM | POA: Diagnosis not present

## 2015-01-08 DIAGNOSIS — E039 Hypothyroidism, unspecified: Secondary | ICD-10-CM | POA: Diagnosis not present

## 2015-01-08 DIAGNOSIS — F329 Major depressive disorder, single episode, unspecified: Secondary | ICD-10-CM | POA: Diagnosis not present

## 2015-01-08 DIAGNOSIS — Z6841 Body Mass Index (BMI) 40.0 and over, adult: Secondary | ICD-10-CM | POA: Insufficient documentation

## 2015-01-08 DIAGNOSIS — G4730 Sleep apnea, unspecified: Secondary | ICD-10-CM | POA: Diagnosis not present

## 2015-01-08 DIAGNOSIS — T8131XA Disruption of external operation (surgical) wound, not elsewhere classified, initial encounter: Secondary | ICD-10-CM | POA: Diagnosis not present

## 2015-01-08 DIAGNOSIS — Z86718 Personal history of other venous thrombosis and embolism: Secondary | ICD-10-CM | POA: Insufficient documentation

## 2015-01-08 DIAGNOSIS — E11622 Type 2 diabetes mellitus with other skin ulcer: Secondary | ICD-10-CM | POA: Diagnosis not present

## 2015-01-08 DIAGNOSIS — Z79899 Other long term (current) drug therapy: Secondary | ICD-10-CM | POA: Diagnosis not present

## 2015-01-29 DIAGNOSIS — E11622 Type 2 diabetes mellitus with other skin ulcer: Secondary | ICD-10-CM | POA: Diagnosis not present

## 2015-01-29 DIAGNOSIS — Z6841 Body Mass Index (BMI) 40.0 and over, adult: Secondary | ICD-10-CM | POA: Diagnosis not present

## 2015-01-29 DIAGNOSIS — T8189XA Other complications of procedures, not elsewhere classified, initial encounter: Secondary | ICD-10-CM | POA: Diagnosis not present

## 2015-01-29 DIAGNOSIS — T8131XA Disruption of external operation (surgical) wound, not elsewhere classified, initial encounter: Secondary | ICD-10-CM | POA: Diagnosis not present

## 2015-01-29 DIAGNOSIS — E1140 Type 2 diabetes mellitus with diabetic neuropathy, unspecified: Secondary | ICD-10-CM | POA: Diagnosis not present

## 2015-01-29 DIAGNOSIS — G4730 Sleep apnea, unspecified: Secondary | ICD-10-CM | POA: Diagnosis not present

## 2015-01-29 DIAGNOSIS — I10 Essential (primary) hypertension: Secondary | ICD-10-CM | POA: Diagnosis not present

## 2015-01-29 DIAGNOSIS — S31105S Unspecified open wound of abdominal wall, periumbilic region without penetration into peritoneal cavity, sequela: Secondary | ICD-10-CM | POA: Diagnosis not present

## 2015-02-05 ENCOUNTER — Encounter (HOSPITAL_BASED_OUTPATIENT_CLINIC_OR_DEPARTMENT_OTHER): Payer: Medicare Other | Attending: Surgery

## 2015-02-05 DIAGNOSIS — Y838 Other surgical procedures as the cause of abnormal reaction of the patient, or of later complication, without mention of misadventure at the time of the procedure: Secondary | ICD-10-CM | POA: Insufficient documentation

## 2015-02-05 DIAGNOSIS — E1142 Type 2 diabetes mellitus with diabetic polyneuropathy: Secondary | ICD-10-CM | POA: Diagnosis not present

## 2015-02-05 DIAGNOSIS — Z86718 Personal history of other venous thrombosis and embolism: Secondary | ICD-10-CM | POA: Insufficient documentation

## 2015-02-05 DIAGNOSIS — I10 Essential (primary) hypertension: Secondary | ICD-10-CM | POA: Insufficient documentation

## 2015-02-05 DIAGNOSIS — L98499 Non-pressure chronic ulcer of skin of other sites with unspecified severity: Secondary | ICD-10-CM | POA: Diagnosis not present

## 2015-02-05 DIAGNOSIS — G4730 Sleep apnea, unspecified: Secondary | ICD-10-CM | POA: Diagnosis not present

## 2015-02-05 DIAGNOSIS — E668 Other obesity: Secondary | ICD-10-CM | POA: Diagnosis not present

## 2015-02-05 DIAGNOSIS — E11622 Type 2 diabetes mellitus with other skin ulcer: Secondary | ICD-10-CM | POA: Insufficient documentation

## 2015-02-05 DIAGNOSIS — Z6841 Body Mass Index (BMI) 40.0 and over, adult: Secondary | ICD-10-CM | POA: Diagnosis not present

## 2015-02-05 DIAGNOSIS — T8131XA Disruption of external operation (surgical) wound, not elsewhere classified, initial encounter: Secondary | ICD-10-CM | POA: Diagnosis not present

## 2015-02-05 DIAGNOSIS — T8189XA Other complications of procedures, not elsewhere classified, initial encounter: Secondary | ICD-10-CM | POA: Diagnosis not present

## 2015-02-19 DIAGNOSIS — T8131XA Disruption of external operation (surgical) wound, not elsewhere classified, initial encounter: Secondary | ICD-10-CM | POA: Diagnosis not present

## 2015-02-19 DIAGNOSIS — I10 Essential (primary) hypertension: Secondary | ICD-10-CM | POA: Diagnosis not present

## 2015-02-19 DIAGNOSIS — E1142 Type 2 diabetes mellitus with diabetic polyneuropathy: Secondary | ICD-10-CM | POA: Diagnosis not present

## 2015-02-19 DIAGNOSIS — L98499 Non-pressure chronic ulcer of skin of other sites with unspecified severity: Secondary | ICD-10-CM | POA: Diagnosis not present

## 2015-02-19 DIAGNOSIS — S31105S Unspecified open wound of abdominal wall, periumbilic region without penetration into peritoneal cavity, sequela: Secondary | ICD-10-CM | POA: Diagnosis not present

## 2015-02-19 DIAGNOSIS — E11622 Type 2 diabetes mellitus with other skin ulcer: Secondary | ICD-10-CM | POA: Diagnosis not present

## 2015-02-19 DIAGNOSIS — T8189XA Other complications of procedures, not elsewhere classified, initial encounter: Secondary | ICD-10-CM | POA: Diagnosis not present

## 2015-02-25 DIAGNOSIS — Z23 Encounter for immunization: Secondary | ICD-10-CM | POA: Diagnosis not present

## 2015-02-25 DIAGNOSIS — F329 Major depressive disorder, single episode, unspecified: Secondary | ICD-10-CM | POA: Diagnosis not present

## 2015-02-25 DIAGNOSIS — E1149 Type 2 diabetes mellitus with other diabetic neurological complication: Secondary | ICD-10-CM | POA: Diagnosis not present

## 2015-02-25 DIAGNOSIS — E1142 Type 2 diabetes mellitus with diabetic polyneuropathy: Secondary | ICD-10-CM | POA: Diagnosis not present

## 2015-02-25 DIAGNOSIS — E041 Nontoxic single thyroid nodule: Secondary | ICD-10-CM | POA: Diagnosis not present

## 2015-02-25 DIAGNOSIS — T148 Other injury of unspecified body region: Secondary | ICD-10-CM | POA: Diagnosis not present

## 2015-02-25 DIAGNOSIS — C541 Malignant neoplasm of endometrium: Secondary | ICD-10-CM | POA: Diagnosis not present

## 2015-02-25 DIAGNOSIS — D649 Anemia, unspecified: Secondary | ICD-10-CM | POA: Diagnosis not present

## 2015-02-25 DIAGNOSIS — Z6841 Body Mass Index (BMI) 40.0 and over, adult: Secondary | ICD-10-CM | POA: Diagnosis not present

## 2015-02-25 DIAGNOSIS — E038 Other specified hypothyroidism: Secondary | ICD-10-CM | POA: Diagnosis not present

## 2015-02-25 DIAGNOSIS — E668 Other obesity: Secondary | ICD-10-CM | POA: Diagnosis not present

## 2015-02-25 DIAGNOSIS — E559 Vitamin D deficiency, unspecified: Secondary | ICD-10-CM | POA: Diagnosis not present

## 2015-03-05 DIAGNOSIS — I10 Essential (primary) hypertension: Secondary | ICD-10-CM | POA: Diagnosis not present

## 2015-03-05 DIAGNOSIS — E11622 Type 2 diabetes mellitus with other skin ulcer: Secondary | ICD-10-CM | POA: Diagnosis not present

## 2015-03-05 DIAGNOSIS — T8131XA Disruption of external operation (surgical) wound, not elsewhere classified, initial encounter: Secondary | ICD-10-CM | POA: Diagnosis not present

## 2015-03-05 DIAGNOSIS — E1142 Type 2 diabetes mellitus with diabetic polyneuropathy: Secondary | ICD-10-CM | POA: Diagnosis not present

## 2015-03-05 DIAGNOSIS — L98499 Non-pressure chronic ulcer of skin of other sites with unspecified severity: Secondary | ICD-10-CM | POA: Diagnosis not present

## 2015-03-05 DIAGNOSIS — T8189XA Other complications of procedures, not elsewhere classified, initial encounter: Secondary | ICD-10-CM | POA: Diagnosis not present

## 2015-03-05 DIAGNOSIS — S31105S Unspecified open wound of abdominal wall, periumbilic region without penetration into peritoneal cavity, sequela: Secondary | ICD-10-CM | POA: Diagnosis not present

## 2015-03-12 DIAGNOSIS — S31109A Unspecified open wound of abdominal wall, unspecified quadrant without penetration into peritoneal cavity, initial encounter: Secondary | ICD-10-CM | POA: Diagnosis not present

## 2015-03-19 ENCOUNTER — Encounter (HOSPITAL_BASED_OUTPATIENT_CLINIC_OR_DEPARTMENT_OTHER): Payer: Medicare Other | Attending: Surgery

## 2015-03-19 DIAGNOSIS — G4730 Sleep apnea, unspecified: Secondary | ICD-10-CM | POA: Diagnosis not present

## 2015-03-19 DIAGNOSIS — E1142 Type 2 diabetes mellitus with diabetic polyneuropathy: Secondary | ICD-10-CM | POA: Diagnosis not present

## 2015-03-19 DIAGNOSIS — L98491 Non-pressure chronic ulcer of skin of other sites limited to breakdown of skin: Secondary | ICD-10-CM | POA: Insufficient documentation

## 2015-03-19 DIAGNOSIS — E668 Other obesity: Secondary | ICD-10-CM | POA: Diagnosis not present

## 2015-03-19 DIAGNOSIS — T8189XA Other complications of procedures, not elsewhere classified, initial encounter: Secondary | ICD-10-CM | POA: Insufficient documentation

## 2015-03-19 DIAGNOSIS — Y838 Other surgical procedures as the cause of abnormal reaction of the patient, or of later complication, without mention of misadventure at the time of the procedure: Secondary | ICD-10-CM | POA: Insufficient documentation

## 2015-03-19 DIAGNOSIS — E11622 Type 2 diabetes mellitus with other skin ulcer: Secondary | ICD-10-CM | POA: Diagnosis not present

## 2015-03-19 DIAGNOSIS — T8131XA Disruption of external operation (surgical) wound, not elsewhere classified, initial encounter: Secondary | ICD-10-CM | POA: Diagnosis not present

## 2015-03-19 DIAGNOSIS — Z6841 Body Mass Index (BMI) 40.0 and over, adult: Secondary | ICD-10-CM | POA: Diagnosis not present

## 2015-03-19 DIAGNOSIS — I10 Essential (primary) hypertension: Secondary | ICD-10-CM | POA: Diagnosis not present

## 2015-03-19 DIAGNOSIS — Z86718 Personal history of other venous thrombosis and embolism: Secondary | ICD-10-CM | POA: Insufficient documentation

## 2015-04-08 MED FILL — TRESIBA FLEXTOUCH 200 UNITS: 200 | 30 days supply | Qty: 18 | Fill #0

## 2015-04-09 ENCOUNTER — Encounter (HOSPITAL_BASED_OUTPATIENT_CLINIC_OR_DEPARTMENT_OTHER): Payer: Medicare Other | Attending: Surgery

## 2015-04-09 DIAGNOSIS — Y838 Other surgical procedures as the cause of abnormal reaction of the patient, or of later complication, without mention of misadventure at the time of the procedure: Secondary | ICD-10-CM | POA: Insufficient documentation

## 2015-04-09 DIAGNOSIS — L98491 Non-pressure chronic ulcer of skin of other sites limited to breakdown of skin: Secondary | ICD-10-CM | POA: Insufficient documentation

## 2015-04-09 DIAGNOSIS — Z86718 Personal history of other venous thrombosis and embolism: Secondary | ICD-10-CM | POA: Diagnosis not present

## 2015-04-09 DIAGNOSIS — E1142 Type 2 diabetes mellitus with diabetic polyneuropathy: Secondary | ICD-10-CM | POA: Insufficient documentation

## 2015-04-09 DIAGNOSIS — I10 Essential (primary) hypertension: Secondary | ICD-10-CM | POA: Diagnosis not present

## 2015-04-09 DIAGNOSIS — F329 Major depressive disorder, single episode, unspecified: Secondary | ICD-10-CM | POA: Insufficient documentation

## 2015-04-09 DIAGNOSIS — E11622 Type 2 diabetes mellitus with other skin ulcer: Secondary | ICD-10-CM | POA: Insufficient documentation

## 2015-04-09 DIAGNOSIS — T8131XA Disruption of external operation (surgical) wound, not elsewhere classified, initial encounter: Secondary | ICD-10-CM | POA: Diagnosis not present

## 2015-04-09 DIAGNOSIS — G4730 Sleep apnea, unspecified: Secondary | ICD-10-CM | POA: Insufficient documentation

## 2015-04-09 DIAGNOSIS — S31105S Unspecified open wound of abdominal wall, periumbilic region without penetration into peritoneal cavity, sequela: Secondary | ICD-10-CM | POA: Diagnosis not present

## 2015-04-09 DIAGNOSIS — Z6841 Body Mass Index (BMI) 40.0 and over, adult: Secondary | ICD-10-CM | POA: Insufficient documentation

## 2015-04-23 DIAGNOSIS — E11622 Type 2 diabetes mellitus with other skin ulcer: Secondary | ICD-10-CM | POA: Diagnosis not present

## 2015-04-23 DIAGNOSIS — S31105S Unspecified open wound of abdominal wall, periumbilic region without penetration into peritoneal cavity, sequela: Secondary | ICD-10-CM | POA: Diagnosis not present

## 2015-04-23 DIAGNOSIS — I10 Essential (primary) hypertension: Secondary | ICD-10-CM | POA: Diagnosis not present

## 2015-04-23 DIAGNOSIS — L98491 Non-pressure chronic ulcer of skin of other sites limited to breakdown of skin: Secondary | ICD-10-CM | POA: Diagnosis not present

## 2015-04-23 DIAGNOSIS — E1142 Type 2 diabetes mellitus with diabetic polyneuropathy: Secondary | ICD-10-CM | POA: Diagnosis not present

## 2015-04-23 DIAGNOSIS — T8131XA Disruption of external operation (surgical) wound, not elsewhere classified, initial encounter: Secondary | ICD-10-CM | POA: Diagnosis not present

## 2015-04-28 MED FILL — VALSARTAN-HCTZ 320-25 MG TA: 320-25 | 90 days supply | Qty: 90 | Fill #2

## 2015-04-28 MED FILL — DOXYCYCLINE HYCLATE 100 MG: 100 | 14 days supply | Qty: 28 | Fill #0

## 2015-04-28 MED FILL — HUMALOG 100 UNITS/ML KWIKPE: 100 | 25 days supply | Qty: 15 | Fill #1

## 2015-05-01 DIAGNOSIS — E119 Type 2 diabetes mellitus without complications: Secondary | ICD-10-CM | POA: Diagnosis not present

## 2015-05-01 DIAGNOSIS — H2513 Age-related nuclear cataract, bilateral: Secondary | ICD-10-CM | POA: Diagnosis not present

## 2015-05-01 DIAGNOSIS — H5203 Hypermetropia, bilateral: Secondary | ICD-10-CM | POA: Diagnosis not present

## 2015-05-05 ENCOUNTER — Ambulatory Visit: Payer: Self-pay | Admitting: Gynecology

## 2015-05-07 ENCOUNTER — Encounter (HOSPITAL_BASED_OUTPATIENT_CLINIC_OR_DEPARTMENT_OTHER): Payer: Medicare Other | Attending: Surgery

## 2015-05-07 DIAGNOSIS — T8131XA Disruption of external operation (surgical) wound, not elsewhere classified, initial encounter: Secondary | ICD-10-CM | POA: Insufficient documentation

## 2015-05-07 DIAGNOSIS — Z6841 Body Mass Index (BMI) 40.0 and over, adult: Secondary | ICD-10-CM | POA: Insufficient documentation

## 2015-05-07 DIAGNOSIS — E1140 Type 2 diabetes mellitus with diabetic neuropathy, unspecified: Secondary | ICD-10-CM | POA: Insufficient documentation

## 2015-05-07 DIAGNOSIS — Y838 Other surgical procedures as the cause of abnormal reaction of the patient, or of later complication, without mention of misadventure at the time of the procedure: Secondary | ICD-10-CM | POA: Insufficient documentation

## 2015-05-07 DIAGNOSIS — I10 Essential (primary) hypertension: Secondary | ICD-10-CM | POA: Insufficient documentation

## 2015-05-07 DIAGNOSIS — G4730 Sleep apnea, unspecified: Secondary | ICD-10-CM | POA: Insufficient documentation

## 2015-05-07 DIAGNOSIS — E1142 Type 2 diabetes mellitus with diabetic polyneuropathy: Secondary | ICD-10-CM | POA: Insufficient documentation

## 2015-05-07 DIAGNOSIS — Z86718 Personal history of other venous thrombosis and embolism: Secondary | ICD-10-CM | POA: Insufficient documentation

## 2015-05-07 DIAGNOSIS — E11622 Type 2 diabetes mellitus with other skin ulcer: Secondary | ICD-10-CM | POA: Insufficient documentation

## 2015-05-07 DIAGNOSIS — A4901 Methicillin susceptible Staphylococcus aureus infection, unspecified site: Secondary | ICD-10-CM | POA: Insufficient documentation

## 2015-05-08 MED FILL — TRESIBA FLEXTOUCH 200 UNITS: 200 | 30 days supply | Qty: 18 | Fill #1

## 2015-05-08 MED FILL — DULoxetine HCL 30 MG CPEP: 30 | 30 days supply | Qty: 30 | Fill #0

## 2015-05-21 DIAGNOSIS — E1142 Type 2 diabetes mellitus with diabetic polyneuropathy: Secondary | ICD-10-CM | POA: Diagnosis not present

## 2015-05-21 DIAGNOSIS — T8131XA Disruption of external operation (surgical) wound, not elsewhere classified, initial encounter: Secondary | ICD-10-CM | POA: Diagnosis not present

## 2015-05-21 DIAGNOSIS — E11622 Type 2 diabetes mellitus with other skin ulcer: Secondary | ICD-10-CM | POA: Diagnosis not present

## 2015-05-21 DIAGNOSIS — Z86718 Personal history of other venous thrombosis and embolism: Secondary | ICD-10-CM | POA: Diagnosis not present

## 2015-05-21 DIAGNOSIS — I10 Essential (primary) hypertension: Secondary | ICD-10-CM | POA: Diagnosis not present

## 2015-05-21 DIAGNOSIS — Z6841 Body Mass Index (BMI) 40.0 and over, adult: Secondary | ICD-10-CM | POA: Diagnosis not present

## 2015-05-21 DIAGNOSIS — E1140 Type 2 diabetes mellitus with diabetic neuropathy, unspecified: Secondary | ICD-10-CM | POA: Diagnosis not present

## 2015-05-21 DIAGNOSIS — G4730 Sleep apnea, unspecified: Secondary | ICD-10-CM | POA: Diagnosis not present

## 2015-05-21 DIAGNOSIS — Y838 Other surgical procedures as the cause of abnormal reaction of the patient, or of later complication, without mention of misadventure at the time of the procedure: Secondary | ICD-10-CM | POA: Diagnosis not present

## 2015-05-21 DIAGNOSIS — A4901 Methicillin susceptible Staphylococcus aureus infection, unspecified site: Secondary | ICD-10-CM | POA: Diagnosis not present

## 2015-05-22 ENCOUNTER — Other Ambulatory Visit: Payer: Self-pay

## 2015-05-22 DIAGNOSIS — Z1231 Encounter for screening mammogram for malignant neoplasm of breast: Secondary | ICD-10-CM

## 2015-05-29 MED FILL — NOVOLOG FLEXPEN SYRINGE: 100 | 90 days supply | Qty: 63 | Fill #0

## 2015-06-02 MED FILL — DULoxetine HCL 30 MG CPEP: 30 | 30 days supply | Qty: 30 | Fill #1

## 2015-06-02 MED FILL — TRULICITY 0.75 MG/0.5 ML PE: 0.75 | 28 days supply | Qty: 2 | Fill #5

## 2015-06-02 MED FILL — TRESIBA FLEXTOUCH 200 UNITS: 200 | 30 days supply | Qty: 18 | Fill #2

## 2015-06-03 DIAGNOSIS — G4730 Sleep apnea, unspecified: Secondary | ICD-10-CM | POA: Diagnosis not present

## 2015-06-03 DIAGNOSIS — S31105A Unspecified open wound of abdominal wall, periumbilic region without penetration into peritoneal cavity, initial encounter: Secondary | ICD-10-CM | POA: Diagnosis not present

## 2015-06-03 DIAGNOSIS — Z86718 Personal history of other venous thrombosis and embolism: Secondary | ICD-10-CM | POA: Diagnosis not present

## 2015-06-03 DIAGNOSIS — E11622 Type 2 diabetes mellitus with other skin ulcer: Secondary | ICD-10-CM | POA: Diagnosis not present

## 2015-06-03 DIAGNOSIS — A4901 Methicillin susceptible Staphylococcus aureus infection, unspecified site: Secondary | ICD-10-CM | POA: Diagnosis not present

## 2015-06-03 DIAGNOSIS — E1142 Type 2 diabetes mellitus with diabetic polyneuropathy: Secondary | ICD-10-CM | POA: Diagnosis not present

## 2015-06-03 DIAGNOSIS — T8131XA Disruption of external operation (surgical) wound, not elsewhere classified, initial encounter: Secondary | ICD-10-CM | POA: Diagnosis not present

## 2015-06-04 ENCOUNTER — Ambulatory Visit (INDEPENDENT_AMBULATORY_CARE_PROVIDER_SITE_OTHER): Payer: Medicare Other | Admitting: Internal Medicine

## 2015-06-04 ENCOUNTER — Encounter: Payer: Self-pay | Admitting: Internal Medicine

## 2015-06-04 ENCOUNTER — Ambulatory Visit: Payer: Medicare Other | Admitting: Internal Medicine

## 2015-06-04 VITALS — BP 124/56 | HR 89 | Ht 68.0 in | Wt 364.0 lb

## 2015-06-04 DIAGNOSIS — R0600 Dyspnea, unspecified: Secondary | ICD-10-CM

## 2015-06-04 DIAGNOSIS — R0609 Other forms of dyspnea: Secondary | ICD-10-CM

## 2015-06-04 DIAGNOSIS — G4733 Obstructive sleep apnea (adult) (pediatric): Secondary | ICD-10-CM | POA: Diagnosis not present

## 2015-06-04 MED FILL — METFORMIN HCL ER 500 MG TAB: 500 | 90 days supply | Qty: 180 | Fill #0

## 2015-06-04 NOTE — Assessment & Plan Note (Signed)
Unfortunately she has not been able to make lifestyle changes necessary to achieve meaningful weight loss despite previous bariatric surgery

## 2015-06-04 NOTE — Assessment & Plan Note (Signed)
She describes great compliance and control with definitely improved quality of life using CPAP 15. Doesn't sleep without it.

## 2015-06-04 NOTE — Patient Instructions (Addendum)
Order- office spirometry    Dx dyspnea  Sample Breo Ellipta    Inhale 1 puff then rinse mouth, once daily  Print script for CPAP mask of choice, supplies   Dx OSA    You can use this with an on-line supplier like CPAP.com  Please call if we can help

## 2015-06-04 NOTE — Assessment & Plan Note (Signed)
Obesity is sufficient to explain this. Office spirometry showed mild restriction also consistent with obesity. There may be very mild obstructive component as well, although not definite on this test. Plan-sample Breo 100 Ellipta therapeutic trial as discussed

## 2015-06-04 NOTE — Progress Notes (Signed)
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 Has noticed she gets sleepy during the day(in hot room). She remains fully compliant with CPAP 12 CWP/Advanced, nasal mask. She has had lap band surgery and is working on weight loss so that she can have abdominal hernia repairs by a Psychologist, sport and exercise in [place]. She is planning to retire so that she can devote full attention to her weight loss effort. 2 cups of coffee in the morning. Admits sleepiness sitting in warm room.  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  Using CPAP reliably. No special needs expressed. Has not been able to lose weight Does complain of perennial mucoid rhinorrhea which is bothersome, without sneeze or headache.  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 Much better with CPAP, good compliance, well rested since pressure increased to 15 on 12/10. Slowly healing incision needed skin graft after hernia repair.  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 Pt needs order sent for DL as [facility] has no DL on file for [place]. Pt needs new Rx for new face mask. She describes her CPAP experience as "fabulous", used all night every night with marked benefit for her sleep quality.  06/04/2015-65 year old female never smoker, retired Software engineer, followed for OSA, located by morbid obesity, DM 2 CPAP 15/Advanced DME:[facility], Wears CPAP nightly. Denies problems with mask or pressure.   Has noticed  some shortness of breath and slight wheeze with less exertion walking than she would've expected. No chest pain or palpitation. 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 FVC 2.65/76%, FEV1 1.96/74%, ratio 0.74, FEF 25-75 percent 1.44/63%.  ROS-see HPI Constitutional:   Limited  weight loss, No- night sweats, fevers, chills, fatigue, lassitude. HEENT:   No-  headaches, difficulty swallowing, tooth/dental problems, sore throat,       No-  sneezing, itching, ear ache, nasal congestion, +post nasal drip,  CV:  No-   chest pain, orthopnea, PND, swelling in lower extremities, anasarca,  dizziness, palpitations Resp: No- acute  shortness of breath with exertion or at rest.              No-   productive cough,  No non-productive cough,  No- coughing up of blood.              No-   change in color of mucus.  No- wheezing.   Skin: No-   rash or lesions. GI:  No-   heartburn, indigestion, abdominal pain, nausea, vomiting,  GU: MS:  No-   joint pain or swelling.   Neuro-     nothing unusual Psych:  No- change in mood or affect. No depression or anxiety.  No memory loss.  OBJ- Physical Exam General- Alert, Oriented, Affect-appropriate, Distress- none acute, + morbid obesity, weight at highest in 5 or 6 years. Skin- rash-none, lesions- none, excoriation- none Lymphadenopathy- none Head- atraumatic            Eyes- Gross vision intact, PERRLA, conjunctivae and secretions clear  Ears- Hearing, canals-normal            Nose- Clear, no-Septal dev, No-polyps, erosion, perforation             Throat- Mallampati IV , mucosa clear , drainage- none, tonsils- atrophic Neck- flexible , trachea midline, no stridor , thyroid nl, carotid no bruit Chest - symmetrical excursion , unlabored           Heart/CV- RRR , 1/6 AS murmur , no gallop  , no rub, nl s1 s2                           - JVD- none , edema- none, stasis changes- none, varices- none            Lung- clear to P&A, wheeze- none, cough- none , dullness-none, rub- none           Chest wall-  Abd-  Br/ Gen/ Rectal- Not done, not indicated Extrem- cyanosis- none, clubbing, none, atrophy- none, strength- nl Neuro- grossly intact to observation

## 2015-06-11 DIAGNOSIS — S31109A Unspecified open wound of abdominal wall, unspecified quadrant without penetration into peritoneal cavity, initial encounter: Secondary | ICD-10-CM | POA: Diagnosis not present

## 2015-06-17 ENCOUNTER — Encounter (HOSPITAL_BASED_OUTPATIENT_CLINIC_OR_DEPARTMENT_OTHER): Payer: Medicare Other | Attending: Internal Medicine

## 2015-06-17 DIAGNOSIS — T8131XA Disruption of external operation (surgical) wound, not elsewhere classified, initial encounter: Secondary | ICD-10-CM | POA: Diagnosis not present

## 2015-06-17 DIAGNOSIS — I10 Essential (primary) hypertension: Secondary | ICD-10-CM | POA: Diagnosis not present

## 2015-06-17 DIAGNOSIS — G4730 Sleep apnea, unspecified: Secondary | ICD-10-CM | POA: Insufficient documentation

## 2015-06-17 DIAGNOSIS — Y838 Other surgical procedures as the cause of abnormal reaction of the patient, or of later complication, without mention of misadventure at the time of the procedure: Secondary | ICD-10-CM | POA: Insufficient documentation

## 2015-06-17 DIAGNOSIS — T8189XA Other complications of procedures, not elsewhere classified, initial encounter: Secondary | ICD-10-CM | POA: Insufficient documentation

## 2015-06-17 DIAGNOSIS — Z86718 Personal history of other venous thrombosis and embolism: Secondary | ICD-10-CM | POA: Diagnosis not present

## 2015-06-17 DIAGNOSIS — L98491 Non-pressure chronic ulcer of skin of other sites limited to breakdown of skin: Secondary | ICD-10-CM | POA: Insufficient documentation

## 2015-06-17 DIAGNOSIS — S31105S Unspecified open wound of abdominal wall, periumbilic region without penetration into peritoneal cavity, sequela: Secondary | ICD-10-CM | POA: Diagnosis not present

## 2015-06-17 DIAGNOSIS — Z6841 Body Mass Index (BMI) 40.0 and over, adult: Secondary | ICD-10-CM | POA: Insufficient documentation

## 2015-06-17 DIAGNOSIS — E11622 Type 2 diabetes mellitus with other skin ulcer: Secondary | ICD-10-CM | POA: Insufficient documentation

## 2015-06-17 DIAGNOSIS — E1142 Type 2 diabetes mellitus with diabetic polyneuropathy: Secondary | ICD-10-CM | POA: Insufficient documentation

## 2015-06-27 MED FILL — UNIFINE PENTIPS 8MM 31G: 31G X 8 MM | 90 days supply | Qty: 300 | Fill #1

## 2015-07-01 DIAGNOSIS — L98491 Non-pressure chronic ulcer of skin of other sites limited to breakdown of skin: Secondary | ICD-10-CM | POA: Diagnosis not present

## 2015-07-01 DIAGNOSIS — T8189XA Other complications of procedures, not elsewhere classified, initial encounter: Secondary | ICD-10-CM | POA: Diagnosis not present

## 2015-07-01 DIAGNOSIS — E11622 Type 2 diabetes mellitus with other skin ulcer: Secondary | ICD-10-CM | POA: Diagnosis not present

## 2015-07-01 DIAGNOSIS — E1142 Type 2 diabetes mellitus with diabetic polyneuropathy: Secondary | ICD-10-CM | POA: Diagnosis not present

## 2015-07-01 DIAGNOSIS — I10 Essential (primary) hypertension: Secondary | ICD-10-CM | POA: Diagnosis not present

## 2015-07-01 DIAGNOSIS — Z124 Encounter for screening for malignant neoplasm of cervix: Secondary | ICD-10-CM | POA: Diagnosis not present

## 2015-07-01 DIAGNOSIS — Z6841 Body Mass Index (BMI) 40.0 and over, adult: Secondary | ICD-10-CM | POA: Diagnosis not present

## 2015-07-01 DIAGNOSIS — S31105S Unspecified open wound of abdominal wall, periumbilic region without penetration into peritoneal cavity, sequela: Secondary | ICD-10-CM | POA: Diagnosis not present

## 2015-07-01 DIAGNOSIS — T8131XA Disruption of external operation (surgical) wound, not elsewhere classified, initial encounter: Secondary | ICD-10-CM | POA: Diagnosis not present

## 2015-07-02 MED FILL — DULoxetine HCL 30 MG CPEP: 30 | 30 days supply | Qty: 30 | Fill #2

## 2015-07-02 MED FILL — TRESIBA FLEXTOUCH 200 UNITS: 200 | 30 days supply | Qty: 18 | Fill #3

## 2015-07-02 MED FILL — TRULICITY 0.75 MG/0.5 ML PE: 0.75 | 28 days supply | Qty: 2 | Fill #6

## 2015-07-09 MED FILL — LEVOTHYROXINE 200 MCG TAB: 200 | 90 days supply | Qty: 90 | Fill #0

## 2015-07-14 ENCOUNTER — Ambulatory Visit
Admission: RE | Admit: 2015-07-14 | Discharge: 2015-07-14 | Disposition: A | Discharge status: Home / Self Care | Payer: BLUE CROSS/BLUE SHIELD | Source: Ambulatory Visit

## 2015-07-14 DIAGNOSIS — Z1231 Encounter for screening mammogram for malignant neoplasm of breast: Secondary | ICD-10-CM | POA: Diagnosis not present

## 2015-07-14 MED FILL — ATORVASTATIN 10 MG TABLET: 10 | 90 days supply | Qty: 90 | Fill #1

## 2015-07-15 ENCOUNTER — Encounter (HOSPITAL_BASED_OUTPATIENT_CLINIC_OR_DEPARTMENT_OTHER): Payer: Medicare Other | Attending: Surgery

## 2015-07-15 DIAGNOSIS — I10 Essential (primary) hypertension: Secondary | ICD-10-CM | POA: Insufficient documentation

## 2015-07-15 DIAGNOSIS — G4730 Sleep apnea, unspecified: Secondary | ICD-10-CM | POA: Insufficient documentation

## 2015-07-15 DIAGNOSIS — Z86718 Personal history of other venous thrombosis and embolism: Secondary | ICD-10-CM | POA: Insufficient documentation

## 2015-07-15 DIAGNOSIS — Y838 Other surgical procedures as the cause of abnormal reaction of the patient, or of later complication, without mention of misadventure at the time of the procedure: Secondary | ICD-10-CM | POA: Insufficient documentation

## 2015-07-15 DIAGNOSIS — T8189XA Other complications of procedures, not elsewhere classified, initial encounter: Secondary | ICD-10-CM | POA: Insufficient documentation

## 2015-07-15 DIAGNOSIS — E1140 Type 2 diabetes mellitus with diabetic neuropathy, unspecified: Secondary | ICD-10-CM | POA: Insufficient documentation

## 2015-07-23 DIAGNOSIS — E11622 Type 2 diabetes mellitus with other skin ulcer: Secondary | ICD-10-CM | POA: Diagnosis not present

## 2015-07-23 DIAGNOSIS — S31105S Unspecified open wound of abdominal wall, periumbilic region without penetration into peritoneal cavity, sequela: Secondary | ICD-10-CM | POA: Diagnosis not present

## 2015-07-23 DIAGNOSIS — T8131XA Disruption of external operation (surgical) wound, not elsewhere classified, initial encounter: Secondary | ICD-10-CM | POA: Diagnosis not present

## 2015-07-23 DIAGNOSIS — T8189XA Other complications of procedures, not elsewhere classified, initial encounter: Secondary | ICD-10-CM | POA: Diagnosis not present

## 2015-07-23 DIAGNOSIS — Y838 Other surgical procedures as the cause of abnormal reaction of the patient, or of later complication, without mention of misadventure at the time of the procedure: Secondary | ICD-10-CM | POA: Diagnosis not present

## 2015-07-23 DIAGNOSIS — I10 Essential (primary) hypertension: Secondary | ICD-10-CM | POA: Diagnosis not present

## 2015-07-23 DIAGNOSIS — Z86718 Personal history of other venous thrombosis and embolism: Secondary | ICD-10-CM | POA: Diagnosis not present

## 2015-07-23 DIAGNOSIS — E1142 Type 2 diabetes mellitus with diabetic polyneuropathy: Secondary | ICD-10-CM | POA: Diagnosis not present

## 2015-07-23 DIAGNOSIS — E1140 Type 2 diabetes mellitus with diabetic neuropathy, unspecified: Secondary | ICD-10-CM | POA: Diagnosis not present

## 2015-07-23 DIAGNOSIS — G4730 Sleep apnea, unspecified: Secondary | ICD-10-CM | POA: Diagnosis not present

## 2015-08-04 DIAGNOSIS — G4739 Other sleep apnea: Secondary | ICD-10-CM | POA: Diagnosis not present

## 2015-08-04 DIAGNOSIS — Z6841 Body Mass Index (BMI) 40.0 and over, adult: Secondary | ICD-10-CM | POA: Diagnosis not present

## 2015-08-04 DIAGNOSIS — E041 Nontoxic single thyroid nodule: Secondary | ICD-10-CM | POA: Diagnosis not present

## 2015-08-04 DIAGNOSIS — D6489 Other specified anemias: Secondary | ICD-10-CM | POA: Diagnosis not present

## 2015-08-04 DIAGNOSIS — E668 Other obesity: Secondary | ICD-10-CM | POA: Diagnosis not present

## 2015-08-04 DIAGNOSIS — E1142 Type 2 diabetes mellitus with diabetic polyneuropathy: Secondary | ICD-10-CM | POA: Diagnosis not present

## 2015-08-04 DIAGNOSIS — C541 Malignant neoplasm of endometrium: Secondary | ICD-10-CM | POA: Diagnosis not present

## 2015-08-04 DIAGNOSIS — E784 Other hyperlipidemia: Secondary | ICD-10-CM | POA: Diagnosis not present

## 2015-08-04 DIAGNOSIS — T148 Other injury of unspecified body region: Secondary | ICD-10-CM | POA: Diagnosis not present

## 2015-08-04 DIAGNOSIS — E1149 Type 2 diabetes mellitus with other diabetic neurological complication: Secondary | ICD-10-CM | POA: Diagnosis not present

## 2015-08-04 DIAGNOSIS — I10 Essential (primary) hypertension: Secondary | ICD-10-CM | POA: Diagnosis not present

## 2015-08-04 MED FILL — VALSARTAN-HCTZ 320-25 MG TA: 320-25 | 90 days supply | Qty: 90 | Fill #0

## 2015-08-04 MED FILL — FUROSEMIDE 40 MG TABLET: 40 | 30 days supply | Qty: 30 | Fill #0

## 2015-08-04 MED FILL — DULoxetine HCL 30 MG CPEP: 30 | 30 days supply | Qty: 30 | Fill #3

## 2015-08-05 ENCOUNTER — Encounter (HOSPITAL_BASED_OUTPATIENT_CLINIC_OR_DEPARTMENT_OTHER): Payer: Medicare Other | Attending: Surgery

## 2015-08-05 DIAGNOSIS — Z6841 Body Mass Index (BMI) 40.0 and over, adult: Secondary | ICD-10-CM | POA: Insufficient documentation

## 2015-08-05 DIAGNOSIS — E1142 Type 2 diabetes mellitus with diabetic polyneuropathy: Secondary | ICD-10-CM | POA: Insufficient documentation

## 2015-08-05 DIAGNOSIS — T8131XA Disruption of external operation (surgical) wound, not elsewhere classified, initial encounter: Secondary | ICD-10-CM | POA: Diagnosis not present

## 2015-08-05 DIAGNOSIS — Z7982 Long term (current) use of aspirin: Secondary | ICD-10-CM | POA: Insufficient documentation

## 2015-08-05 DIAGNOSIS — Y838 Other surgical procedures as the cause of abnormal reaction of the patient, or of later complication, without mention of misadventure at the time of the procedure: Secondary | ICD-10-CM | POA: Insufficient documentation

## 2015-08-05 DIAGNOSIS — I10 Essential (primary) hypertension: Secondary | ICD-10-CM | POA: Insufficient documentation

## 2015-08-05 DIAGNOSIS — Z794 Long term (current) use of insulin: Secondary | ICD-10-CM | POA: Insufficient documentation

## 2015-08-05 DIAGNOSIS — E11622 Type 2 diabetes mellitus with other skin ulcer: Secondary | ICD-10-CM | POA: Diagnosis not present

## 2015-08-05 DIAGNOSIS — Z79899 Other long term (current) drug therapy: Secondary | ICD-10-CM | POA: Insufficient documentation

## 2015-08-05 DIAGNOSIS — G4730 Sleep apnea, unspecified: Secondary | ICD-10-CM | POA: Diagnosis not present

## 2015-08-05 DIAGNOSIS — S31105S Unspecified open wound of abdominal wall, periumbilic region without penetration into peritoneal cavity, sequela: Secondary | ICD-10-CM | POA: Diagnosis not present

## 2015-08-12 MED FILL — TRESIBA FLEXTOUCH 200 UNITS: 200 | 30 days supply | Qty: 18 | Fill #4

## 2015-08-12 MED FILL — TRULICITY 0.75 MG/0.5 ML PE: 0.75 | 28 days supply | Qty: 2 | Fill #7

## 2015-08-19 DIAGNOSIS — G4730 Sleep apnea, unspecified: Secondary | ICD-10-CM | POA: Diagnosis not present

## 2015-08-19 DIAGNOSIS — I10 Essential (primary) hypertension: Secondary | ICD-10-CM | POA: Diagnosis not present

## 2015-08-19 DIAGNOSIS — E11622 Type 2 diabetes mellitus with other skin ulcer: Secondary | ICD-10-CM | POA: Diagnosis not present

## 2015-08-19 DIAGNOSIS — E668 Other obesity: Secondary | ICD-10-CM | POA: Diagnosis not present

## 2015-08-19 DIAGNOSIS — E1142 Type 2 diabetes mellitus with diabetic polyneuropathy: Secondary | ICD-10-CM | POA: Diagnosis not present

## 2015-08-19 DIAGNOSIS — Z6841 Body Mass Index (BMI) 40.0 and over, adult: Secondary | ICD-10-CM | POA: Diagnosis not present

## 2015-08-19 DIAGNOSIS — T8131XA Disruption of external operation (surgical) wound, not elsewhere classified, initial encounter: Secondary | ICD-10-CM | POA: Diagnosis not present

## 2015-08-25 DIAGNOSIS — N958 Other specified menopausal and perimenopausal disorders: Secondary | ICD-10-CM | POA: Diagnosis not present

## 2015-08-25 DIAGNOSIS — R29890 Loss of height: Secondary | ICD-10-CM | POA: Diagnosis not present

## 2015-09-05 MED FILL — TRESIBA FLEXTOUCH 200 UNITS: 200 | 30 days supply | Qty: 18 | Fill #5

## 2015-09-05 MED FILL — DULoxetine HCL 30 MG CPEP: 30 | 30 days supply | Qty: 30 | Fill #4

## 2015-09-15 MED FILL — METFORMIN HCL ER 500 MG TAB: 500 | 90 days supply | Qty: 180 | Fill #1

## 2015-09-16 ENCOUNTER — Encounter (HOSPITAL_BASED_OUTPATIENT_CLINIC_OR_DEPARTMENT_OTHER): Payer: Medicare Other | Attending: Surgery

## 2015-09-16 DIAGNOSIS — E669 Obesity, unspecified: Secondary | ICD-10-CM | POA: Insufficient documentation

## 2015-09-16 DIAGNOSIS — Z794 Long term (current) use of insulin: Secondary | ICD-10-CM | POA: Insufficient documentation

## 2015-09-16 DIAGNOSIS — Z6841 Body Mass Index (BMI) 40.0 and over, adult: Secondary | ICD-10-CM | POA: Insufficient documentation

## 2015-09-16 DIAGNOSIS — T8131XA Disruption of external operation (surgical) wound, not elsewhere classified, initial encounter: Secondary | ICD-10-CM | POA: Insufficient documentation

## 2015-09-16 DIAGNOSIS — Z7982 Long term (current) use of aspirin: Secondary | ICD-10-CM | POA: Insufficient documentation

## 2015-09-16 DIAGNOSIS — I10 Essential (primary) hypertension: Secondary | ICD-10-CM | POA: Insufficient documentation

## 2015-09-16 DIAGNOSIS — E1140 Type 2 diabetes mellitus with diabetic neuropathy, unspecified: Secondary | ICD-10-CM | POA: Insufficient documentation

## 2015-09-16 DIAGNOSIS — Z79899 Other long term (current) drug therapy: Secondary | ICD-10-CM | POA: Insufficient documentation

## 2015-09-16 DIAGNOSIS — Z86718 Personal history of other venous thrombosis and embolism: Secondary | ICD-10-CM | POA: Insufficient documentation

## 2015-09-16 DIAGNOSIS — G4730 Sleep apnea, unspecified: Secondary | ICD-10-CM | POA: Insufficient documentation

## 2015-09-16 DIAGNOSIS — Y838 Other surgical procedures as the cause of abnormal reaction of the patient, or of later complication, without mention of misadventure at the time of the procedure: Secondary | ICD-10-CM | POA: Insufficient documentation

## 2015-09-17 DIAGNOSIS — S31109A Unspecified open wound of abdominal wall, unspecified quadrant without penetration into peritoneal cavity, initial encounter: Secondary | ICD-10-CM | POA: Diagnosis not present

## 2015-09-17 MED FILL — SILVER NITRATE APPLICATOR: 75-25 | 70 days supply | Qty: 20 | Fill #0

## 2015-09-18 MED FILL — NOVOLOG FLEXPEN SYRINGE: 100 | 90 days supply | Qty: 63 | Fill #1

## 2015-09-23 DIAGNOSIS — Z6841 Body Mass Index (BMI) 40.0 and over, adult: Secondary | ICD-10-CM | POA: Diagnosis not present

## 2015-09-23 DIAGNOSIS — E11622 Type 2 diabetes mellitus with other skin ulcer: Secondary | ICD-10-CM | POA: Diagnosis not present

## 2015-09-23 DIAGNOSIS — E1140 Type 2 diabetes mellitus with diabetic neuropathy, unspecified: Secondary | ICD-10-CM | POA: Diagnosis not present

## 2015-09-23 DIAGNOSIS — Z86718 Personal history of other venous thrombosis and embolism: Secondary | ICD-10-CM | POA: Diagnosis not present

## 2015-09-23 DIAGNOSIS — I10 Essential (primary) hypertension: Secondary | ICD-10-CM | POA: Diagnosis not present

## 2015-09-23 DIAGNOSIS — S31105S Unspecified open wound of abdominal wall, periumbilic region without penetration into peritoneal cavity, sequela: Secondary | ICD-10-CM | POA: Diagnosis not present

## 2015-09-23 DIAGNOSIS — T8131XA Disruption of external operation (surgical) wound, not elsewhere classified, initial encounter: Secondary | ICD-10-CM | POA: Diagnosis not present

## 2015-09-23 DIAGNOSIS — G4730 Sleep apnea, unspecified: Secondary | ICD-10-CM | POA: Diagnosis not present

## 2015-09-23 DIAGNOSIS — X35XXXA Volcanic eruption, initial encounter: Secondary | ICD-10-CM | POA: Diagnosis not present

## 2015-09-23 DIAGNOSIS — Y838 Other surgical procedures as the cause of abnormal reaction of the patient, or of later complication, without mention of misadventure at the time of the procedure: Secondary | ICD-10-CM | POA: Diagnosis not present

## 2015-09-23 DIAGNOSIS — E669 Obesity, unspecified: Secondary | ICD-10-CM | POA: Diagnosis not present

## 2015-09-23 DIAGNOSIS — Z7982 Long term (current) use of aspirin: Secondary | ICD-10-CM | POA: Diagnosis not present

## 2015-09-23 DIAGNOSIS — L0390 Cellulitis, unspecified: Secondary | ICD-10-CM | POA: Diagnosis not present

## 2015-09-23 DIAGNOSIS — Z794 Long term (current) use of insulin: Secondary | ICD-10-CM | POA: Diagnosis not present

## 2015-09-23 DIAGNOSIS — Z79899 Other long term (current) drug therapy: Secondary | ICD-10-CM | POA: Diagnosis not present

## 2015-09-23 MED FILL — CIPROFLOXACIN HCL 500 MG TA: 500 | 10 days supply | Qty: 20 | Fill #0

## 2015-09-30 MED FILL — UNIFINE PENTIPS 8MM 31G: 31G X 8 MM | 90 days supply | Qty: 300 | Fill #0

## 2015-10-08 ENCOUNTER — Encounter (HOSPITAL_BASED_OUTPATIENT_CLINIC_OR_DEPARTMENT_OTHER): Payer: Medicare Other | Attending: Surgery

## 2015-10-08 DIAGNOSIS — E1142 Type 2 diabetes mellitus with diabetic polyneuropathy: Secondary | ICD-10-CM | POA: Diagnosis not present

## 2015-10-08 DIAGNOSIS — T8131XA Disruption of external operation (surgical) wound, not elsewhere classified, initial encounter: Secondary | ICD-10-CM | POA: Diagnosis not present

## 2015-10-08 DIAGNOSIS — E11622 Type 2 diabetes mellitus with other skin ulcer: Secondary | ICD-10-CM | POA: Diagnosis not present

## 2015-10-08 DIAGNOSIS — E1140 Type 2 diabetes mellitus with diabetic neuropathy, unspecified: Secondary | ICD-10-CM | POA: Insufficient documentation

## 2015-10-08 DIAGNOSIS — Z86718 Personal history of other venous thrombosis and embolism: Secondary | ICD-10-CM | POA: Diagnosis not present

## 2015-10-08 DIAGNOSIS — G4730 Sleep apnea, unspecified: Secondary | ICD-10-CM | POA: Diagnosis not present

## 2015-10-08 DIAGNOSIS — S31105S Unspecified open wound of abdominal wall, periumbilic region without penetration into peritoneal cavity, sequela: Secondary | ICD-10-CM | POA: Diagnosis not present

## 2015-10-08 DIAGNOSIS — I10 Essential (primary) hypertension: Secondary | ICD-10-CM | POA: Diagnosis not present

## 2015-10-08 DIAGNOSIS — Y838 Other surgical procedures as the cause of abnormal reaction of the patient, or of later complication, without mention of misadventure at the time of the procedure: Secondary | ICD-10-CM | POA: Insufficient documentation

## 2015-10-08 MED FILL — ATORVASTATIN 10 MG TABLET: 10 | 90 days supply | Qty: 90 | Fill #0

## 2015-10-08 MED FILL — LEVOTHYROXINE 200 MCG TAB: 200 | 90 days supply | Qty: 90 | Fill #1

## 2015-10-20 DIAGNOSIS — Z8719 Personal history of other diseases of the digestive system: Secondary | ICD-10-CM | POA: Diagnosis not present

## 2015-10-28 DIAGNOSIS — E11622 Type 2 diabetes mellitus with other skin ulcer: Secondary | ICD-10-CM | POA: Diagnosis not present

## 2015-10-28 DIAGNOSIS — I10 Essential (primary) hypertension: Secondary | ICD-10-CM | POA: Diagnosis not present

## 2015-10-28 DIAGNOSIS — S31105S Unspecified open wound of abdominal wall, periumbilic region without penetration into peritoneal cavity, sequela: Secondary | ICD-10-CM | POA: Diagnosis not present

## 2015-10-28 DIAGNOSIS — T8131XA Disruption of external operation (surgical) wound, not elsewhere classified, initial encounter: Secondary | ICD-10-CM | POA: Diagnosis not present

## 2015-10-28 DIAGNOSIS — E668 Other obesity: Secondary | ICD-10-CM | POA: Diagnosis not present

## 2015-10-28 DIAGNOSIS — E1140 Type 2 diabetes mellitus with diabetic neuropathy, unspecified: Secondary | ICD-10-CM | POA: Diagnosis not present

## 2015-10-28 DIAGNOSIS — Z86718 Personal history of other venous thrombosis and embolism: Secondary | ICD-10-CM | POA: Diagnosis not present

## 2015-10-28 DIAGNOSIS — G4730 Sleep apnea, unspecified: Secondary | ICD-10-CM | POA: Diagnosis not present

## 2015-10-28 DIAGNOSIS — E1142 Type 2 diabetes mellitus with diabetic polyneuropathy: Secondary | ICD-10-CM | POA: Diagnosis not present

## 2015-10-28 MED FILL — VALSARTAN-HCTZ 320-25 MG TA: 320-25 | 90 days supply | Qty: 90 | Fill #1

## 2015-10-28 MED FILL — TRULICITY 0.75 MG/0.5 ML PE: 0.75 | 28 days supply | Qty: 2 | Fill #0

## 2015-10-28 MED FILL — TRESIBA FLEXTOUCH 200 UNITS: 200 | 36 days supply | Qty: 18 | Fill #0

## 2015-10-28 MED FILL — DULoxetine HCL 30 MG CPEP: 30 | 30 days supply | Qty: 30 | Fill #0

## 2015-11-12 DIAGNOSIS — R1900 Intra-abdominal and pelvic swelling, mass and lump, unspecified site: Secondary | ICD-10-CM | POA: Diagnosis not present

## 2015-11-12 DIAGNOSIS — K469 Unspecified abdominal hernia without obstruction or gangrene: Secondary | ICD-10-CM | POA: Diagnosis not present

## 2015-11-12 DIAGNOSIS — N200 Calculus of kidney: Secondary | ICD-10-CM | POA: Diagnosis not present

## 2015-11-12 DIAGNOSIS — Z9889 Other specified postprocedural states: Secondary | ICD-10-CM | POA: Diagnosis not present

## 2015-11-12 DIAGNOSIS — L02211 Cutaneous abscess of abdominal wall: Secondary | ICD-10-CM | POA: Diagnosis not present

## 2015-11-12 DIAGNOSIS — T8579XA Infection and inflammatory reaction due to other internal prosthetic devices, implants and grafts, initial encounter: Secondary | ICD-10-CM | POA: Diagnosis not present

## 2015-11-12 MED FILL — CEFADROXIL 500 MG CAPSULE: 500 | 30 days supply | Qty: 120 | Fill #0

## 2015-11-20 MED FILL — TRESIBA FLEXTOUCH 200 UNITS: 200 | 90 days supply | Qty: 54 | Fill #0

## 2015-11-25 ENCOUNTER — Encounter (HOSPITAL_BASED_OUTPATIENT_CLINIC_OR_DEPARTMENT_OTHER): Payer: Medicare Other | Attending: Surgery

## 2015-11-25 DIAGNOSIS — Y838 Other surgical procedures as the cause of abnormal reaction of the patient, or of later complication, without mention of misadventure at the time of the procedure: Secondary | ICD-10-CM | POA: Diagnosis not present

## 2015-11-25 DIAGNOSIS — S31105S Unspecified open wound of abdominal wall, periumbilic region without penetration into peritoneal cavity, sequela: Secondary | ICD-10-CM | POA: Diagnosis not present

## 2015-11-25 DIAGNOSIS — G4730 Sleep apnea, unspecified: Secondary | ICD-10-CM | POA: Insufficient documentation

## 2015-11-25 DIAGNOSIS — Z86718 Personal history of other venous thrombosis and embolism: Secondary | ICD-10-CM | POA: Diagnosis not present

## 2015-11-25 DIAGNOSIS — T8131XA Disruption of external operation (surgical) wound, not elsewhere classified, initial encounter: Secondary | ICD-10-CM | POA: Insufficient documentation

## 2015-11-25 DIAGNOSIS — Z6841 Body Mass Index (BMI) 40.0 and over, adult: Secondary | ICD-10-CM | POA: Diagnosis not present

## 2015-11-25 DIAGNOSIS — E1142 Type 2 diabetes mellitus with diabetic polyneuropathy: Secondary | ICD-10-CM | POA: Diagnosis not present

## 2015-11-25 DIAGNOSIS — I10 Essential (primary) hypertension: Secondary | ICD-10-CM | POA: Insufficient documentation

## 2015-11-25 DIAGNOSIS — E11622 Type 2 diabetes mellitus with other skin ulcer: Secondary | ICD-10-CM | POA: Diagnosis not present

## 2015-11-25 DIAGNOSIS — E669 Obesity, unspecified: Secondary | ICD-10-CM | POA: Diagnosis not present

## 2015-11-27 DIAGNOSIS — C541 Malignant neoplasm of endometrium: Secondary | ICD-10-CM | POA: Diagnosis not present

## 2015-11-27 DIAGNOSIS — E784 Other hyperlipidemia: Secondary | ICD-10-CM | POA: Diagnosis not present

## 2015-11-27 DIAGNOSIS — I10 Essential (primary) hypertension: Secondary | ICD-10-CM | POA: Diagnosis not present

## 2015-11-27 DIAGNOSIS — E1142 Type 2 diabetes mellitus with diabetic polyneuropathy: Secondary | ICD-10-CM | POA: Diagnosis not present

## 2015-11-27 DIAGNOSIS — Z6841 Body Mass Index (BMI) 40.0 and over, adult: Secondary | ICD-10-CM | POA: Diagnosis not present

## 2015-11-27 DIAGNOSIS — E1149 Type 2 diabetes mellitus with other diabetic neurological complication: Secondary | ICD-10-CM | POA: Diagnosis not present

## 2015-11-27 DIAGNOSIS — E559 Vitamin D deficiency, unspecified: Secondary | ICD-10-CM | POA: Diagnosis not present

## 2015-11-27 DIAGNOSIS — M545 Low back pain: Secondary | ICD-10-CM | POA: Diagnosis not present

## 2015-11-27 DIAGNOSIS — Z0000 Encounter for general adult medical examination without abnormal findings: Secondary | ICD-10-CM | POA: Diagnosis not present

## 2015-11-27 DIAGNOSIS — D6489 Other specified anemias: Secondary | ICD-10-CM | POA: Diagnosis not present

## 2015-11-27 DIAGNOSIS — E041 Nontoxic single thyroid nodule: Secondary | ICD-10-CM | POA: Diagnosis not present

## 2015-11-27 DIAGNOSIS — G4739 Other sleep apnea: Secondary | ICD-10-CM | POA: Diagnosis not present

## 2015-11-27 MED FILL — DULoxetine HCL 30 MG CPEP: 30 | 30 days supply | Qty: 30 | Fill #1

## 2015-11-28 MED FILL — METFORMIN HCL ER 500 MG TAB: 500 | 30 days supply | Qty: 120 | Fill #0

## 2015-11-28 MED FILL — PANTOPRAZOLE SOD DR 40 MG T: 40 | 30 days supply | Qty: 30 | Fill #0

## 2015-11-29 ENCOUNTER — Other Ambulatory Visit: Payer: Self-pay | Admitting: Endocrinology

## 2015-11-29 DIAGNOSIS — E041 Nontoxic single thyroid nodule: Secondary | ICD-10-CM

## 2015-12-05 ENCOUNTER — Ambulatory Visit
Admission: RE | Admit: 2015-12-05 | Discharge: 2015-12-05 | Disposition: A | Discharge status: Home / Self Care | Payer: Medicare Other | Source: Ambulatory Visit | Attending: Endocrinology | Admitting: Endocrinology

## 2015-12-05 DIAGNOSIS — E042 Nontoxic multinodular goiter: Secondary | ICD-10-CM | POA: Diagnosis not present

## 2015-12-05 DIAGNOSIS — E041 Nontoxic single thyroid nodule: Secondary | ICD-10-CM

## 2015-12-23 MED FILL — CEFADROXIL 500 MG CAPSULE: 500 | 30 days supply | Qty: 120 | Fill #1

## 2015-12-29 MED FILL — DULoxetine HCL 30 MG CPEP: 30 | 30 days supply | Qty: 30 | Fill #2

## 2016-01-13 MED FILL — UNIFINE PENTIPS 8MM 31G: 31G X 8 MM | 90 days supply | Qty: 300 | Fill #1

## 2016-01-13 MED FILL — ATORVASTATIN 10 MG TABLET: 10 | 90 days supply | Qty: 90 | Fill #1

## 2016-01-13 MED FILL — LEVOTHYROXINE 200 MCG TAB: 200 | 90 days supply | Qty: 90 | Fill #0

## 2016-01-19 MED FILL — NOVOLOG FLEXPEN SYRINGE: 100 | 90 days supply | Qty: 63 | Fill #2

## 2016-02-03 MED FILL — VALSARTAN-HCTZ 320-25 MG TA: 320-25 | 90 days supply | Qty: 90 | Fill #2

## 2016-02-03 MED FILL — DULoxetine HCL 30 MG CPEP: 30 | 30 days supply | Qty: 30 | Fill #3

## 2016-02-17 MED FILL — TRULICITY 0.75 MG/0.5 ML PE: 0.75 | 28 days supply | Qty: 2 | Fill #1

## 2016-02-17 MED FILL — TRESIBA FLEXTOUCH 200 UNITS: 200 | 90 days supply | Qty: 54 | Fill #1

## 2016-02-17 MED FILL — METFORMIN HCL ER 500 MG TAB: 500 | 30 days supply | Qty: 120 | Fill #1

## 2016-03-05 MED FILL — DULoxetine HCL 30 MG CPEP: 30 | 30 days supply | Qty: 30 | Fill #4

## 2016-03-08 DIAGNOSIS — H5203 Hypermetropia, bilateral: Secondary | ICD-10-CM | POA: Diagnosis not present

## 2016-03-08 DIAGNOSIS — E119 Type 2 diabetes mellitus without complications: Secondary | ICD-10-CM | POA: Diagnosis not present

## 2016-03-08 DIAGNOSIS — H2513 Age-related nuclear cataract, bilateral: Secondary | ICD-10-CM | POA: Diagnosis not present

## 2016-03-08 DIAGNOSIS — H04123 Dry eye syndrome of bilateral lacrimal glands: Secondary | ICD-10-CM | POA: Diagnosis not present

## 2016-03-24 DIAGNOSIS — L02211 Cutaneous abscess of abdominal wall: Secondary | ICD-10-CM | POA: Diagnosis not present

## 2016-03-24 DIAGNOSIS — Z8719 Personal history of other diseases of the digestive system: Secondary | ICD-10-CM | POA: Diagnosis not present

## 2016-03-31 MED FILL — DULoxetine HCL 30 MG CPEP: 30 | 30 days supply | Qty: 30 | Fill #5

## 2016-03-31 MED FILL — LEVOTHYROXINE 200 MCG TAB: 200 | 90 days supply | Qty: 90 | Fill #1

## 2016-03-31 MED FILL — ATORVASTATIN 10 MG TABLET: 10 | 90 days supply | Qty: 90 | Fill #2

## 2016-03-31 MED FILL — UNIFINE PENTIPS 8MM 31G: 31G X 8 MM | 90 days supply | Qty: 300 | Fill #0

## 2016-03-31 MED FILL — METFORMIN HCL ER 500 MG TAB: 500 | 30 days supply | Qty: 120 | Fill #2

## 2016-03-31 MED FILL — NOVOLOG FLEXPEN SYRINGE: 100 | 90 days supply | Qty: 63 | Fill #3

## 2016-04-20 DIAGNOSIS — C541 Malignant neoplasm of endometrium: Secondary | ICD-10-CM | POA: Diagnosis not present

## 2016-04-20 DIAGNOSIS — E041 Nontoxic single thyroid nodule: Secondary | ICD-10-CM | POA: Diagnosis not present

## 2016-04-20 DIAGNOSIS — E784 Other hyperlipidemia: Secondary | ICD-10-CM | POA: Diagnosis not present

## 2016-04-20 DIAGNOSIS — E038 Other specified hypothyroidism: Secondary | ICD-10-CM | POA: Diagnosis not present

## 2016-04-20 DIAGNOSIS — E1149 Type 2 diabetes mellitus with other diabetic neurological complication: Secondary | ICD-10-CM | POA: Diagnosis not present

## 2016-04-20 DIAGNOSIS — Z6841 Body Mass Index (BMI) 40.0 and over, adult: Secondary | ICD-10-CM | POA: Diagnosis not present

## 2016-04-20 DIAGNOSIS — E668 Other obesity: Secondary | ICD-10-CM | POA: Diagnosis not present

## 2016-04-20 DIAGNOSIS — G4730 Sleep apnea, unspecified: Secondary | ICD-10-CM | POA: Diagnosis not present

## 2016-04-20 DIAGNOSIS — Z1389 Encounter for screening for other disorder: Secondary | ICD-10-CM | POA: Diagnosis not present

## 2016-04-20 DIAGNOSIS — F329 Major depressive disorder, single episode, unspecified: Secondary | ICD-10-CM | POA: Diagnosis not present

## 2016-04-20 DIAGNOSIS — D649 Anemia, unspecified: Secondary | ICD-10-CM | POA: Diagnosis not present

## 2016-04-20 DIAGNOSIS — E1142 Type 2 diabetes mellitus with diabetic polyneuropathy: Secondary | ICD-10-CM | POA: Diagnosis not present

## 2016-04-20 MED FILL — FUROSEMIDE 40 MG TABLET: 40 | 90 days supply | Qty: 90 | Fill #0

## 2016-05-03 MED FILL — DULoxetine HCL 30 MG CPEP: 30 | 30 days supply | Qty: 30 | Fill #6

## 2016-05-03 MED FILL — PANTOPRAZOLE SOD DR 40 MG T: 40 | 30 days supply | Qty: 30 | Fill #1

## 2016-05-03 MED FILL — VALSARTAN-HCTZ 320-25 MG TA: 320-25 | 90 days supply | Qty: 90 | Fill #0

## 2016-05-25 MED FILL — SYNJARDY XR 10-1000 MG TB24: 10-1000 | 30 days supply | Qty: 30 | Fill #0

## 2016-05-31 MED FILL — DULoxetine HCL 30 MG CPEP: 30 | 30 days supply | Qty: 30 | Fill #0

## 2016-06-01 MED FILL — PANTOPRAZOLE SOD DR 40 MG T: 40 | 30 days supply | Qty: 30 | Fill #2

## 2016-06-01 MED FILL — TRESIBA FLEXTOUCH 200 UNITS: 200 | 90 days supply | Qty: 54 | Fill #2

## 2016-06-03 ENCOUNTER — Ambulatory Visit: Payer: Medicare Other | Admitting: Internal Medicine

## 2016-06-14 ENCOUNTER — Other Ambulatory Visit: Payer: Self-pay | Admitting: Obstetrics and Gynecology

## 2016-06-14 DIAGNOSIS — Z1231 Encounter for screening mammogram for malignant neoplasm of breast: Secondary | ICD-10-CM

## 2016-06-21 MED FILL — SYNJARDY XR 10-1000 MG TB24: 10-1000 | 30 days supply | Qty: 30 | Fill #1

## 2016-06-22 MED FILL — LEVOTHYROXINE 200 MCG TAB: 200 | 90 days supply | Qty: 90 | Fill #0

## 2016-06-22 MED FILL — NOVOLOG FLEXPEN SYRINGE: 100 | 90 days supply | Qty: 63 | Fill #0

## 2016-06-22 MED FILL — ATORVASTATIN 10 MG TABLET: 10 | 90 days supply | Qty: 90 | Fill #0

## 2016-07-05 MED FILL — PANTOPRAZOLE SOD DR 40 MG T: 40 | 30 days supply | Qty: 30 | Fill #3

## 2016-07-05 MED FILL — DULoxetine HCL 30 MG CPEP: 30 | 30 days supply | Qty: 30 | Fill #1

## 2016-07-15 ENCOUNTER — Ambulatory Visit
Admission: RE | Admit: 2016-07-15 | Discharge: 2016-07-15 | Disposition: A | Discharge status: Home / Self Care | Payer: BLUE CROSS/BLUE SHIELD | Source: Ambulatory Visit | Attending: Obstetrics and Gynecology | Admitting: Obstetrics and Gynecology

## 2016-07-15 DIAGNOSIS — Z1231 Encounter for screening mammogram for malignant neoplasm of breast: Secondary | ICD-10-CM | POA: Diagnosis not present

## 2016-07-21 MED FILL — SYNJARDY XR 10-1000 MG TB24: 10-1000 | 30 days supply | Qty: 30 | Fill #2

## 2016-07-21 MED FILL — FUROSEMIDE 40 MG TABLET: 40 | 90 days supply | Qty: 90 | Fill #1

## 2016-07-26 DIAGNOSIS — L02211 Cutaneous abscess of abdominal wall: Secondary | ICD-10-CM | POA: Diagnosis not present

## 2016-07-29 MED FILL — VALSARTAN-HCTZ 320-25 MG TA: 320-25 | 90 days supply | Qty: 90 | Fill #1

## 2016-07-29 MED FILL — DULoxetine HCL 30 MG CPEP: 30 | 30 days supply | Qty: 30 | Fill #2

## 2016-07-29 MED FILL — PANTOPRAZOLE SOD DR 40 MG T: 40 | 30 days supply | Qty: 30 | Fill #4

## 2016-08-12 DIAGNOSIS — Z124 Encounter for screening for malignant neoplasm of cervix: Secondary | ICD-10-CM | POA: Diagnosis not present

## 2016-08-12 DIAGNOSIS — E1142 Type 2 diabetes mellitus with diabetic polyneuropathy: Secondary | ICD-10-CM | POA: Diagnosis not present

## 2016-08-12 DIAGNOSIS — I10 Essential (primary) hypertension: Secondary | ICD-10-CM | POA: Diagnosis not present

## 2016-08-12 DIAGNOSIS — E559 Vitamin D deficiency, unspecified: Secondary | ICD-10-CM | POA: Diagnosis not present

## 2016-08-12 DIAGNOSIS — E038 Other specified hypothyroidism: Secondary | ICD-10-CM | POA: Diagnosis not present

## 2016-08-12 DIAGNOSIS — E784 Other hyperlipidemia: Secondary | ICD-10-CM | POA: Diagnosis not present

## 2016-08-12 DIAGNOSIS — Z9884 Bariatric surgery status: Secondary | ICD-10-CM | POA: Diagnosis not present

## 2016-08-12 DIAGNOSIS — E1149 Type 2 diabetes mellitus with other diabetic neurological complication: Secondary | ICD-10-CM | POA: Diagnosis not present

## 2016-08-12 DIAGNOSIS — T8189XS Other complications of procedures, not elsewhere classified, sequela: Secondary | ICD-10-CM | POA: Diagnosis not present

## 2016-08-12 DIAGNOSIS — E041 Nontoxic single thyroid nodule: Secondary | ICD-10-CM | POA: Diagnosis not present

## 2016-08-12 DIAGNOSIS — D649 Anemia, unspecified: Secondary | ICD-10-CM | POA: Diagnosis not present

## 2016-08-12 DIAGNOSIS — E118 Type 2 diabetes mellitus with unspecified complications: Secondary | ICD-10-CM | POA: Diagnosis not present

## 2016-08-12 DIAGNOSIS — F329 Major depressive disorder, single episode, unspecified: Secondary | ICD-10-CM | POA: Diagnosis not present

## 2016-08-12 DIAGNOSIS — Z1389 Encounter for screening for other disorder: Secondary | ICD-10-CM | POA: Diagnosis not present

## 2016-08-12 DIAGNOSIS — Z6841 Body Mass Index (BMI) 40.0 and over, adult: Secondary | ICD-10-CM | POA: Diagnosis not present

## 2016-08-12 DIAGNOSIS — E668 Other obesity: Secondary | ICD-10-CM | POA: Diagnosis not present

## 2016-08-12 DIAGNOSIS — G4730 Sleep apnea, unspecified: Secondary | ICD-10-CM | POA: Diagnosis not present

## 2016-08-18 ENCOUNTER — Encounter: Payer: Self-pay | Admitting: Gynecology

## 2016-08-23 MED FILL — DULoxetine HCL 30 MG CPEP: 30 | 30 days supply | Qty: 30 | Fill #3

## 2016-08-23 MED FILL — PANTOPRAZOLE SOD DR 40 MG T: 40 | 30 days supply | Qty: 30 | Fill #5

## 2016-08-23 MED FILL — UNIFINE PENTIPS 8MM 31G: 31G X 8 MM | 90 days supply | Qty: 300 | Fill #1

## 2016-08-23 MED FILL — TRESIBA FLEXTOUCH 200 UNITS: 200 | 90 days supply | Qty: 54 | Fill #3

## 2016-08-23 MED FILL — SYNJARDY XR 10-1000 MG TB24: 10-1000 | 30 days supply | Qty: 30 | Fill #3

## 2016-09-07 DIAGNOSIS — T8579XA Infection and inflammatory reaction due to other internal prosthetic devices, implants and grafts, initial encounter: Secondary | ICD-10-CM | POA: Diagnosis not present

## 2016-09-15 ENCOUNTER — Telehealth: Payer: Self-pay | Admitting: Internal Medicine

## 2016-09-15 MED FILL — ATORVASTATIN 10 MG TABLET: 10 | 90 days supply | Qty: 90 | Fill #1

## 2016-09-15 MED FILL — SYNJARDY XR 10-1000 MG TB24: 10-1000 | 30 days supply | Qty: 30 | Fill #4

## 2016-09-15 MED FILL — LEVOTHYROXINE 200 MCG TAB: 200 | 90 days supply | Qty: 90 | Fill #1

## 2016-09-15 MED FILL — NOVOLOG FLEXPEN SYRINGE: 100 | 90 days supply | Qty: 63 | Fill #1

## 2016-09-15 NOTE — Telephone Encounter (Signed)
Ok to use next RNA slot open. Thanks.

## 2016-09-15 NOTE — Telephone Encounter (Signed)
Pt returning call for appoint for face to face and can be reached @ [phone].[name]

## 2016-09-15 NOTE — Telephone Encounter (Signed)
LMTCB  In the meantime, will forward to KW to see if we can use a held spot  Please advise thanks!

## 2016-09-15 NOTE — Telephone Encounter (Signed)
Left message for patient to call back  

## 2016-09-16 NOTE — Telephone Encounter (Signed)
lmtcb x2 for pt. 

## 2016-09-17 NOTE — Telephone Encounter (Signed)
Appt date and time is okay to use. Thanks.

## 2016-09-17 NOTE — Telephone Encounter (Signed)
Pt aware of appt scheduled for 09/21/16 at 10:15 with CY Will send to Katie to advise if appt date/time okay(46min slot) is okay -- pt just needs face-to-face for CPAP compliance.

## 2016-09-21 ENCOUNTER — Encounter: Payer: Self-pay | Admitting: Internal Medicine

## 2016-09-21 ENCOUNTER — Ambulatory Visit (INDEPENDENT_AMBULATORY_CARE_PROVIDER_SITE_OTHER): Payer: Medicare Other | Admitting: Internal Medicine

## 2016-09-21 DIAGNOSIS — G4733 Obstructive sleep apnea (adult) (pediatric): Secondary | ICD-10-CM

## 2016-09-21 NOTE — Progress Notes (Signed)
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 FVC 2.65/76%, FEV1 1.96/74%, ratio 0.74, FEF 25-75 percent 1.44/63%. NPSG 02/13/01 AHI 26/hr  ----------------------------------------------------------------------------------------y.  06/04/2015-66 year old female never smoker, retired Software engineer, followed for OSA, located by morbid obesity, DM 2 CPAP 15/[facility], Wears CPAP nightly. Denies problems with mask or pressure.   Has noticed some shortness of breath and slight wheeze with less exertion walking than she would've expected. No chest pain or palpitation. 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 FVC 2.65/76%, FEV1 1.96/74%, ratio 0.74, FEF 25-75 percent 1.44/63%.  09/21/16- 66 year old female never smoker, retired Software engineer, followed for OSA, located by morbid obesity, DM 2 CPAP 15/[facility]> today to [place] auto 10-20 Follow up, CPAP [facility], download attached. Pt wants to change from [facility] to [place]. Pt states CPAP currently broken needs repaired. She continues to say that she "loves my CPAP". Download confirms excellent compliance 94%/4 hour, AHI 0.4/hour. She has been very compliant with no break in therapy. She now would like to change DME company and replace broken machine which is 37-34 years old. Otherwise breathing has been comfortable with no significant health events to report.  ROS-see HPI     + = pos Constitutional:    weight loss, No- night sweats, fevers, chills, fatigue, lassitude. HEENT:   No-  headaches, difficulty swallowing, tooth/dental problems, sore throat,       No-  sneezing, itching, ear ache, nasal congestion, +post nasal drip,  CV:  No-   chest pain, orthopnea, PND, swelling in lower extremities, anasarca,  dizziness, palpitations Resp: No- acute  shortness of breath with exertion or at  rest.              No-   productive cough,  No non-productive cough,  No- coughing up of blood.              No-   change in color of mucus.  No- wheezing.   Skin: No-   rash or lesions. GI:  No-   heartburn, indigestion, abdominal pain, nausea, vomiting,  GU: MS:  No-   joint pain or swelling.   Neuro-     nothing unusual Psych:  No- change in mood or affect. No depression or anxiety.  No memory loss.  OBJ- Physical Exam General- Alert, Oriented, Affect-appropriate, Distress- none acute, + morbid obesity, . Skin- rash-none, lesions- none, excoriation- none Lymphadenopathy- none Head- atraumatic            Eyes- Gross vision intact, PERRLA, conjunctivae and secretions clear            Ears- Hearing, canals-normal            Nose- Clear, no-Septal dev, No-polyps, erosion, perforation             Throat- Mallampati IV , mucosa clear , drainage- none, tonsils- atrophic Neck- flexible , trachea midline, no stridor , thyroid nl, carotid no bruit Chest - symmetrical excursion , unlabored           Heart/CV- RRR , 1/6 AS murmur , no gallop  , no rub, nl s1 s2                           - JVD- none , edema- none, stasis changes- none, varices- none           Lung- clear to P&A, wheeze- none,  cough- none , dullness-none, rub- none           Chest wall-  Abd-  Br/ Gen/ Rectal- Not done, not indicated Extrem- cyanosis- none, clubbing, none, atrophy- none, strength- nl Neuro- grossly intact to observation

## 2016-09-21 NOTE — Assessment & Plan Note (Signed)
She has done extremely well with CPAP, very compliant with good control documented and no break in therapy. Now needs replacement machine and asks to change DME company to take the opportunity to change to auto Pap. Plan-patient requests change to [employer]. Replacement for broken machine changing to [facility] 10-20

## 2016-09-21 NOTE — Patient Instructions (Signed)
Order- Change from [facility] to [facility] D[facility] (patient request), replacement for old, broken CPAP machine, auto 10-15, mask of choice, humidifier, supplies, AirView     Dx OSA  Please call as needed

## 2016-09-21 NOTE — Assessment & Plan Note (Signed)
She knows what to do and what her options are. It is hard for her.

## 2016-09-27 MED FILL — DULoxetine HCL 30 MG CPEP: 30 | 30 days supply | Qty: 30 | Fill #4

## 2016-10-04 ENCOUNTER — Ambulatory Visit: Payer: Medicare Other | Admitting: Internal Medicine

## 2016-10-11 ENCOUNTER — Telehealth: Payer: Self-pay | Admitting: Internal Medicine

## 2016-10-11 NOTE — Telephone Encounter (Signed)
CY  Please Advise-  This pt recently got her new cpap machine and needs to follow up between Aug 7- Sept 6. You have nothing available between these dates. Please advise if ok to schedule patient with NP

## 2016-10-13 NOTE — Telephone Encounter (Signed)
CY - please advise. Thanks! 

## 2016-10-13 NOTE — Telephone Encounter (Signed)
Spoke with pt and informed her of CY's message. Pt agreed to the appt with NP. Appt was made. She had no further questions. Nothing further is needed

## 2016-10-13 NOTE — Telephone Encounter (Signed)
Ok NP visit

## 2016-10-18 MED FILL — SYNJARDY XR 10-1000 MG TB24: 10-1000 | 30 days supply | Qty: 30 | Fill #5

## 2016-10-22 MED FILL — DULoxetine HCL 30 MG CPEP: 30 | 30 days supply | Qty: 30 | Fill #5

## 2016-10-22 MED FILL — VALSARTAN-HCTZ 320-25 MG TA: 320-25 | 90 days supply | Qty: 90 | Fill #2

## 2016-11-03 IMAGING — MG MM SCREENING BREAST TOMO BILATERAL
8 of 13 series · 8 of 29 positions shown · non-contrast
Comparison: Previous exam(s).

ACR Breast Density Category a: The breast tissue is almost entirely
fatty.

CLINICAL DATA: Screening.

EXAM:
2D DIGITAL SCREENING BILATERAL MAMMOGRAM WITH CAD AND ADJUNCT TOMO

[R MLO (1 of 2)]
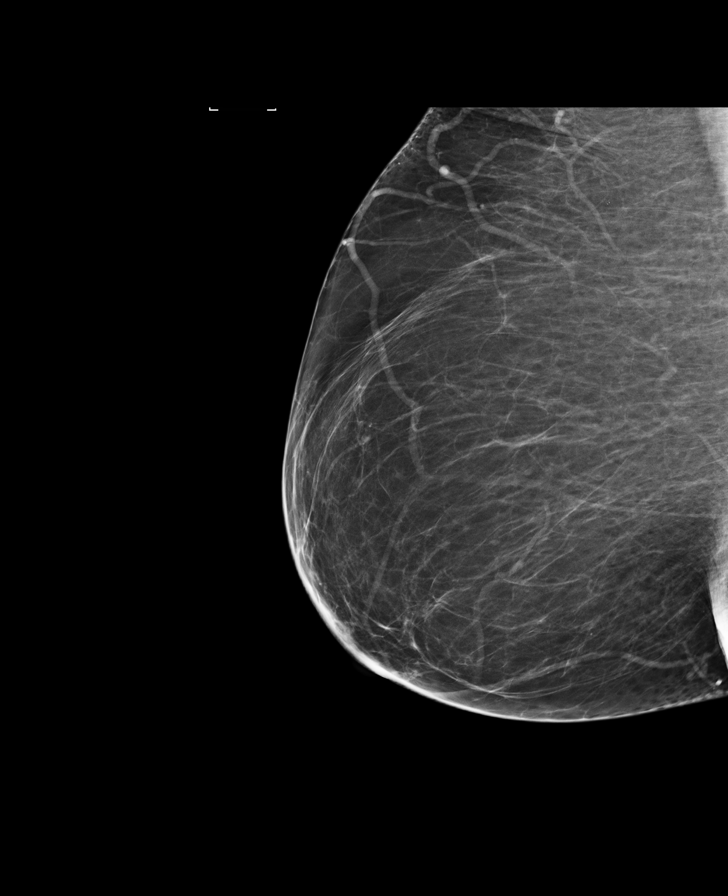

[L MLO]
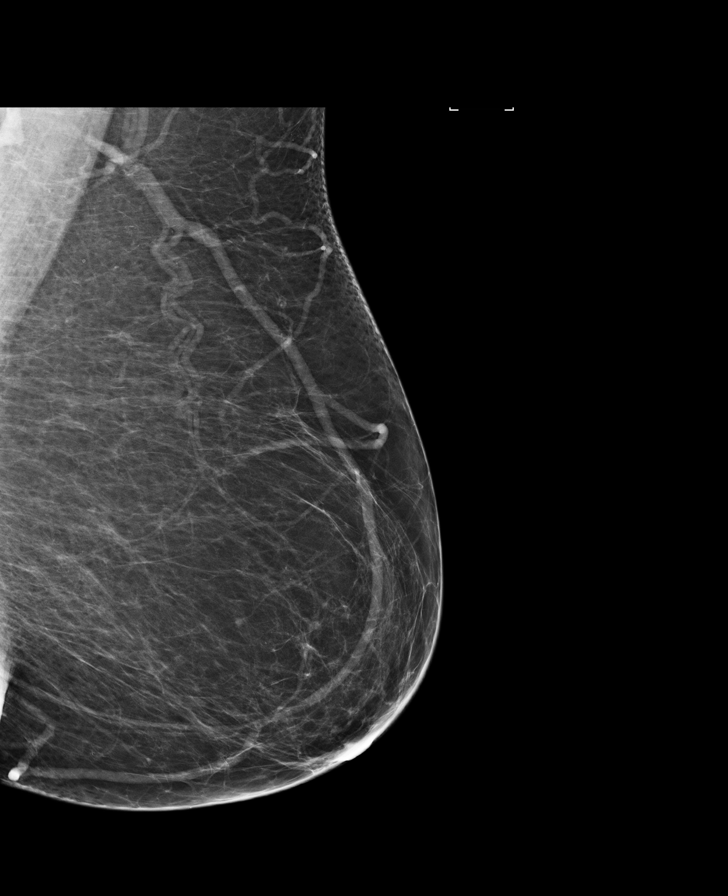

[L CC synth-2D]
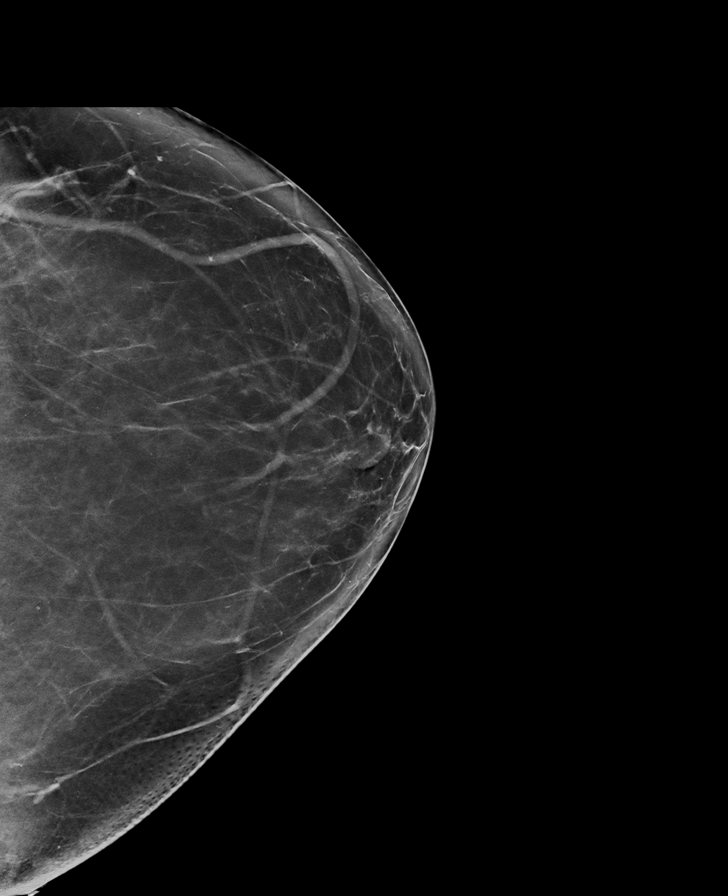

[R MLO (2 of 2)]
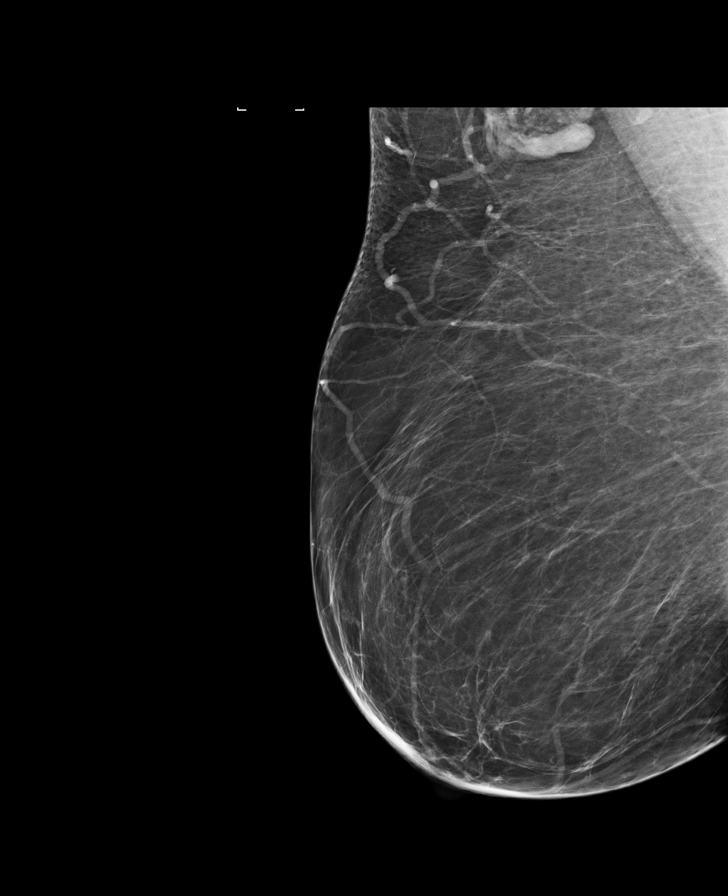

[R CC synth-2D]
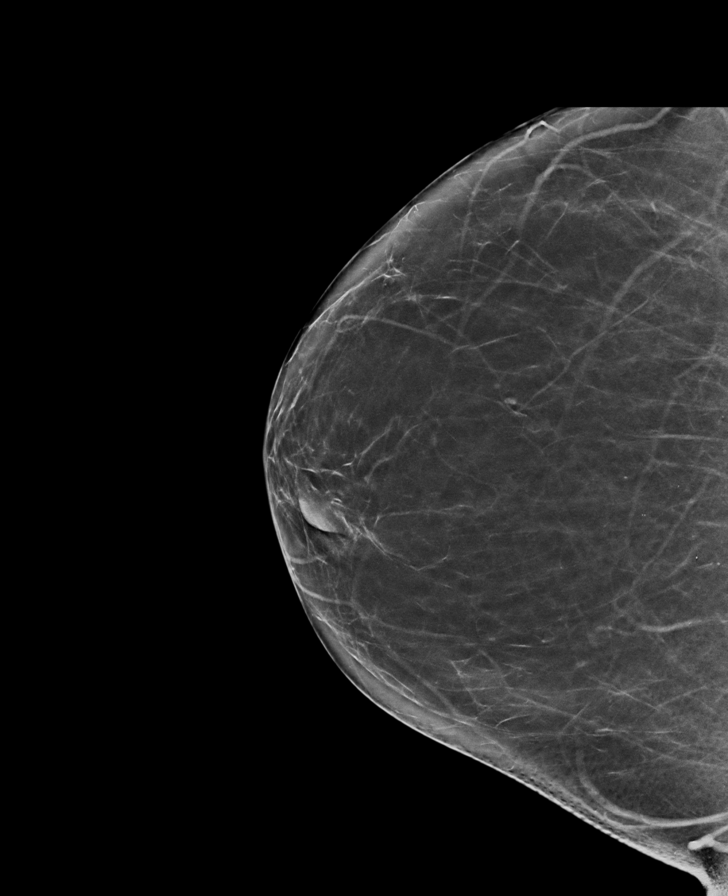

[R CC]
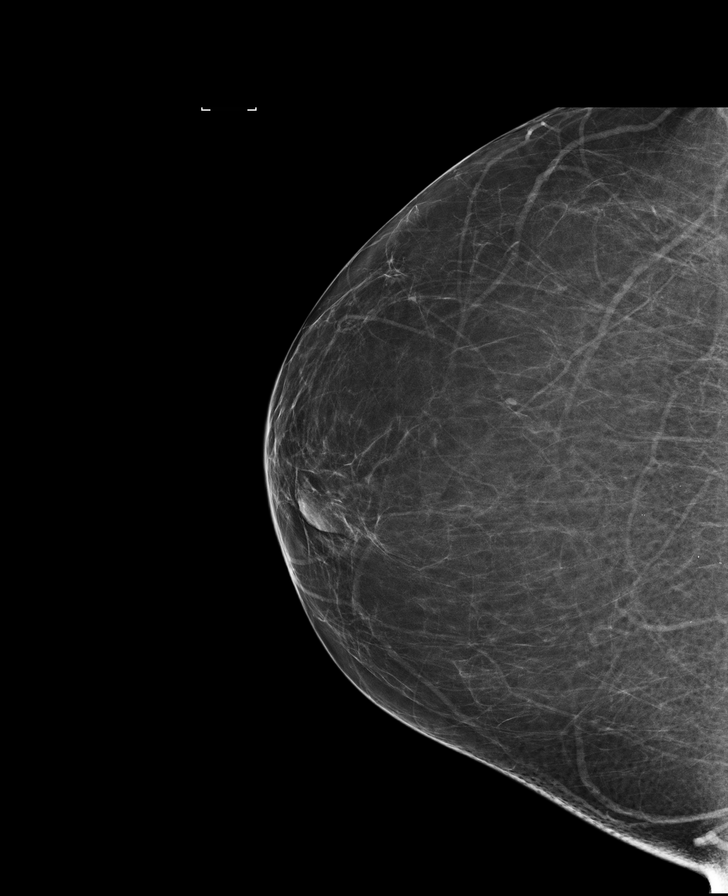

[L CC]
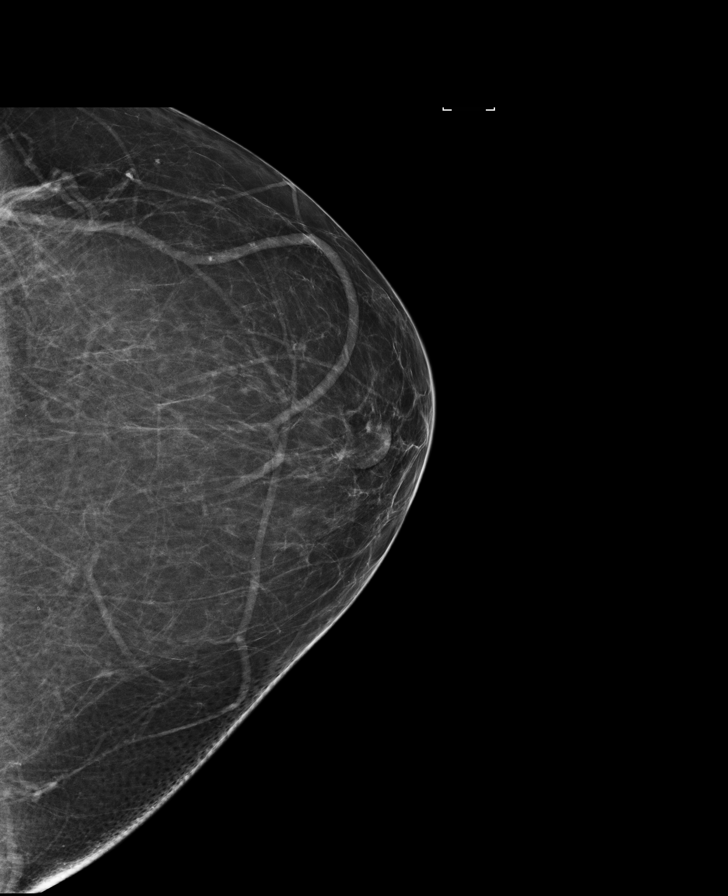

[L MLO synth-2D]
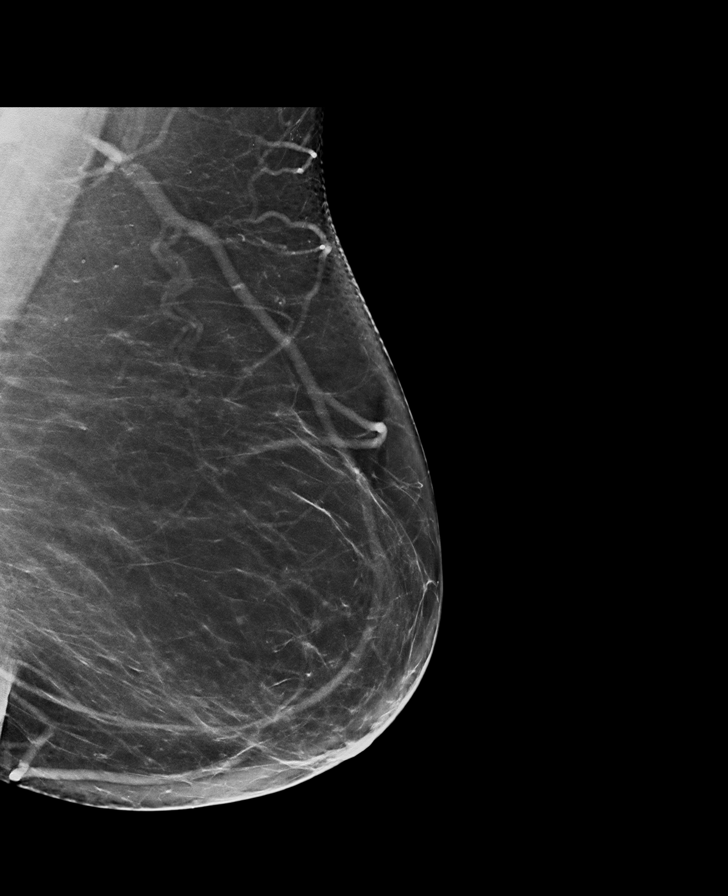

[8 of 29 positions shown; findings below may reference images not displayed]

FINDINGS: There are no findings suspicious for malignancy. Images were
processed with CAD.
IMPRESSION: No mammographic evidence of malignancy. A result letter of this
screening mammogram will be mailed directly to the patient.

RECOMMENDATION:
Screening mammogram in one year. (Code:[id number]-X-8P0)

BI-RADS CATEGORY  1: Negative.

## 2016-11-13 ENCOUNTER — Encounter (HOSPITAL_COMMUNITY): Payer: Self-pay | Admitting: Family Medicine

## 2016-11-13 ENCOUNTER — Ambulatory Visit (HOSPITAL_COMMUNITY)
Admission: EM | Admit: 2016-11-13 | Discharge: 2016-11-13 | Disposition: A | Discharge status: Home / Self Care | Payer: Medicare Other | Attending: Family Medicine | Admitting: Family Medicine

## 2016-11-13 DIAGNOSIS — H1012 Acute atopic conjunctivitis, left eye: Secondary | ICD-10-CM

## 2016-11-13 MED ORDER — OLOPATADINE HCL 0.1 % OP SOLN: 1.0000 [drp] | Freq: Two times a day (BID) | OPHTHALMIC | 12 refills | Status: DC

## 2016-11-13 NOTE — Discharge Instructions (Signed)
The appearance of the left eye is that of an allergic conjunctivitis.  Try to keep the contact lens out as much as possible and allow the anti-inflammatory drops to work for the next couple days.  If the redness persists, the ophthalmologist can use the slit lamp to better see which layer is inflamed and whether steroid drops will work better.

## 2016-11-13 NOTE — ED Provider Notes (Signed)
[place]    CSN: [id number] Arrival date & time: 11/13/16  1728     History   Chief Complaint Chief Complaint  Patient presents with  . Eye Problem    HPI [name] is a 66 y.o. female.   This is 66 year old woman who presents for evaluation of an eye problem. Her left eye became inflamed about 1 week ago although there's been no discharge and no significant discomfort.  The inflammation and redness began after she dropped her contact lens on her shirt which was sweaty at the time. He did not hit the ground but her hands were dirty.  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      Past Medical History:  Diagnosis Date  . Diabetes mellitus   . Hyperlipidemia   . Hypertension   . Joint pain   . Obesity   . Sinus complaint   . Thyroid cancer ([place])   . Thyroid disease   . Uterine cancer ([place])   . Wears glasses     Patient Active Problem List   Diagnosis Date Noted  . Dyspnea on exertion 06/04/2015  . Endometrial ca ([place]) 02/21/2014  . 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 Cancer ([place]) 05/27/2011  . Morbid obesity ([place]) 05/14/2011  . Ventral hernia 05/14/2011  . DIABETES, TYPE 2 03/26/2010  . HYPERLIPIDEMIA 03/26/2010  . Obstructive sleep apnea 01/04/2010    Past Surgical History:  Procedure Laterality Date  . ABDOMINAL HYSTERECTOMY    . APPENDECTOMY    . CESAREAN SECTION    . GASTRIC RESTRICTION SURGERY  04/04/2007   lap band. removed 09/28/12  . HERNIA REPAIR    . hernia repair surgery     09/2012 with skin grafts  . TONSILLECTOMY AND ADENOIDECTOMY      OB History    No data available       Home Medications    Prior to Admission medications   Medication Sig Start Date End Date Taking? Authorizing Provider  aspirin 81 MG tablet Take 81 mg by mouth daily.      [provider]  atorvastatin (LIPITOR) 10 MG tablet Take 10 mg by mouth daily.    [provider]  CALCIUM-MAGNESUIUM-ZINC  333-133-8.3 MG TABS Take 1 tablet by mouth daily.    [provider]  docusate sodium (COLACE) 100 MG capsule Take 100 mg by mouth 2 (two) times daily.     [provider]  DULoxetine (CYMBALTA) 30 MG capsule Take 1 capsule by mouth daily. 06/02/15   [provider]  Empagliflozin-Metformin HCl ER (SYNJARDY XR) 01-999 MG TB24 Take 1 tablet by mouth daily.    [provider]  levothyroxine (SYNTHROID, LEVOTHROID) 200 MCG tablet Take 200 mcg by mouth daily.      [provider]  Multiple Vitamin (MULTIVITAMIN) tablet Take 1 tablet by mouth daily.    [provider]  NOVOLOG FLEXPEN 100 UNIT/ML FlexPen Inject 25-35 Units as directed 2 (two) times daily. 05/29/15   [provider]  olopatadine (PATANOL) 0.1 % ophthalmic solution Place 1 drop into the left eye 2 (two) times daily. 11/13/16   Robyn Haber, MD  ONE TOUCH ULTRA TEST test strip  01/03/14   [provider]  pantoprazole (PROTONIX) 40 MG tablet Take 40 mg by mouth daily.    [provider]  TRESIBA FLEXTOUCH 200 UNIT/ML SOPN Inject 120 Units as directed every morning. 06/02/15   [provider]  UNIFINE PENTIPS 31G X 5  MM MISC  01/03/14   [provider]  valsartan-hydrochlorothiazide (DIOVAN HCT) 320-25 MG per tablet Take 1 tablet by mouth daily.    [provider]    Family History Family History  Problem Relation Age of Onset  . Heart disease Mother   . COPD Mother   . Colon polyps Mother   . Prostate cancer Father   . Colon polyps Father   . Diabetes Sister   . Asthma Sister   . Fibromyalgia Sister   . Colon cancer Neg Hx   . Pancreatic cancer Neg Hx   . Stomach cancer Neg Hx     Social History Social History  Substance Use Topics  . Smoking status: Never Smoker  . Smokeless tobacco: Never Used  . Alcohol use No     Allergies   Oxaprozin; Ampicillin; Codeine; and Scallops [shellfish allergy]   Review of  Systems Review of Systems  Eyes: Positive for redness.  All other systems reviewed and are negative.    Physical Exam Triage Vital Signs ED Triage Vitals [11/13/16 1757]  Enc Vitals Group     BP 135/62     Pulse Rate 90     Resp 18     Temp 98.5 F (36.9 C)     Temp src      SpO2 98 %     Weight      Height      Head Circumference      Peak Flow      Pain Score      Pain Loc      Pain Edu?      Excl. in [place]?    No data found.   Updated Vital Signs BP 135/62   Pulse 90   Temp 98.5 F (36.9 C)   Resp 18   SpO2 98%   Visual Acuity Right Eye Distance:   Left Eye Distance:   Bilateral Distance:    Right Eye Near:   Left Eye Near:    Bilateral Near:     Physical Exam  Constitutional: She appears well-developed and well-nourished.  Eyes: Pupils are equal, round, and reactive to light. EOM are normal. Right eye exhibits no discharge.  The right conjunctiva is diffusely erythematous with no perilimbal predominance. There is no discharge or eyelid swelling. Funduscopic exam does not show any significant retinal changes.  Neck: Normal range of motion. Neck supple.  Pulmonary/Chest: Effort normal.  Musculoskeletal: Normal range of motion.  Nursing note and vitals reviewed.    UC Treatments / Results  Labs (all labs ordered are listed, but only abnormal results are displayed) Labs Reviewed - No data to display  EKG  EKG Interpretation None       Radiology No results found.  Procedures Procedures (including critical care time)  Medications Ordered in UC Medications - No data to display   Initial Impression / Assessment and Plan / UC Course  I have reviewed the triage vital signs and the nursing notes.  Pertinent labs & imaging results that were available during my care of the patient were reviewed by me and considered in my medical decision making (see chart for details).       Final Clinical Impressions(s) / UC Diagnoses   Final diagnoses:    Allergic conjunctivitis of left eye  The appearance of the left eye is that of an allergic conjunctivitis.  Try to keep the contact lens out as much as possible and allow the anti-inflammatory drops to work  for the next couple days.  If the redness persists, the ophthalmologist can use the slit lamp to better see which layer is inflamed and whether steroid drops will work better.  New Prescriptions New Prescriptions   OLOPATADINE (PATANOL) 0.1 % OPHTHALMIC SOLUTION    Place 1 drop into the left eye 2 (two) times daily.     Controlled Substance Prescriptions [place] Controlled Substance Registry consulted? Not Applicable   Robyn Haber, MD 11/13/16 [phone]

## 2016-11-13 NOTE — ED Triage Notes (Signed)
Pt here for left eye redness. Denise pain, fever, drainage. sts that she wears contacts. sts that her contact came out in the woods and she popped it back.

## 2016-11-15 MED FILL — SYNJARDY XR 10-1000 MG TB24: 10-1000 | 30 days supply | Qty: 30 | Fill #0

## 2016-11-17 ENCOUNTER — Ambulatory Visit (INDEPENDENT_AMBULATORY_CARE_PROVIDER_SITE_OTHER): Payer: Medicare Other | Admitting: Acute Care

## 2016-11-17 ENCOUNTER — Encounter: Payer: Self-pay | Admitting: Acute Care

## 2016-11-17 DIAGNOSIS — G4733 Obstructive sleep apnea (adult) (pediatric): Secondary | ICD-10-CM | POA: Diagnosis not present

## 2016-11-17 NOTE — Assessment & Plan Note (Signed)
Compliant with CPAP therapy Good control documented with download Feels better with new CPAP machine Plan Continue on CPAP at bedtime. You appear to be benefiting from the treatment Goal is to wear for at least 4-6 hours each night for maximal clinical benefit. Continue to work on weight loss, as the link between excess weight  and sleep apnea is well established.  Do not drive if sleepy. Clean tubing, filter, mask and reservoir once weekly with soapy water. Please check your blood pressure at home. If it remains high please follow up with your PCP. Follow up with Dr. Annamaria Boots  In  12  months or before as needed.  Please contact office for sooner follow up if symptoms do not improve or worsen or seek emergency care

## 2016-11-17 NOTE — Patient Instructions (Addendum)
It is nice to meet you today Continue on CPAP at bedtime. You appear to be benefiting from the treatment Goal is to wear for at least 4-6 hours each night for maximal clinical benefit. Continue to work on weight loss, as the link between excess weight  and sleep apnea is well established.  Do not drive if sleepy. Clean tubing, filter, mask and reservoir once weekly with soapy water. Please check your blood pressure at home. If it remains high please follow up with your PCP. Follow up with Dr. Annamaria Boots  In  12  months or before as needed.  Please contact office for sooner follow up if symptoms do not improve or worsen or seek emergency care

## 2016-11-17 NOTE — Progress Notes (Signed)
History of Present Illness [name] is a 66 y.o. female with OSA and morbid obesity. She is followed by Dr. Annamaria Boots   11/17/2016 OV for CPAP compliance: Pt. Presents for follow up. She is doing well on her CPAP.She denies any issues with her CPAP. She is wearing it almost every night. She feels the new machine is better than the old machine.Sleeping better with CPAP, increased daytime alertness, and she is able to focus better. She denies chest pain, orthopnea, fever or hemoptysis. 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   Test Results:  CPAP Down Load: 10/17/2016 through 11/15/2016 Air sense 10 auto set AutoSet 10 cm H2O to 20 cm H2O Usage days 28 out of 30 or 93% Greater than 4 hours 28 days are 93% Average usage 7 hours 39 minutes Median pressure 12.3 cm H2O AHI equals 0.6    CBC 04/05/2007 03/28/2007  WBC 13.0(H) 8.5  Hemoglobin 11.1(L) 11.1(L)  Hematocrit 35.1(L) 34.9(L)  Platelets 304 350    BMP 03/28/2007  Glucose 124(H)  BUN 16  Creatinine 0.81  Sodium 140  Potassium 4.1  Chloride 103  CO2 29  Calcium 9.1      Past medical hx Past Medical History:  Diagnosis Date  . Diabetes mellitus   . Hyperlipidemia   . Hypertension   . Joint pain   . Obesity   . Sinus complaint   . Thyroid cancer ([place])   . Thyroid disease   . Uterine cancer ([place])   . Wears glasses      Social History  Substance Use Topics  . Smoking status: Never Smoker  . Smokeless tobacco: Never Used  . Alcohol use No    [name] reports that she has never smoked. She has never used smokeless tobacco. She reports that she does not drink alcohol or use drugs.  Tobacco Cessation: Patient is a never smoker  Past surgical hx, Family hx, Social hx all reviewed.  Current Outpatient Prescriptions on File Prior to Visit  Medication Sig  . aspirin 81 MG tablet Take 81 mg by  mouth daily.    Marland Kitchen atorvastatin (LIPITOR) 10 MG tablet Take 10 mg by mouth daily.  Marland Kitchen CALCIUM-MAGNESUIUM-ZINC 333-133-8.3 MG TABS Take 1 tablet by mouth daily.  Marland Kitchen docusate sodium (COLACE) 100 MG capsule Take 100 mg by mouth 2 (two) times daily.   . DULoxetine (CYMBALTA) 30 MG capsule Take 1 capsule by mouth daily.  . Empagliflozin-Metformin HCl ER (SYNJARDY XR) 01-999 MG TB24 Take 1 tablet by mouth daily.  Marland Kitchen levothyroxine (SYNTHROID, LEVOTHROID) 200 MCG tablet Take 200 mcg by mouth daily.    . Multiple Vitamin (MULTIVITAMIN) tablet Take 1 tablet by mouth daily.  Marland Kitchen NOVOLOG FLEXPEN 100 UNIT/ML FlexPen Inject 25-35 Units as directed 2 (two) times daily.  Marland Kitchen olopatadine (PATANOL) 0.1 % ophthalmic solution Place 1 drop into the left eye 2 (two) times daily.  . ONE TOUCH ULTRA TEST test strip   . pantoprazole (PROTONIX) 40 MG tablet Take 40 mg by mouth daily.  . TRESIBA FLEXTOUCH 200 UNIT/ML SOPN Inject 120 Units as directed every morning.  Marland Kitchen UNIFINE PENTIPS 31G X 5 MM MISC   . valsartan-hydrochlorothiazide (DIOVAN HCT) 320-25 MG per tablet Take 1 tablet by mouth daily.   No current facility-administered medications on file prior to visit.      Allergies  Allergen Reactions  . Oxaprozin Other (See Comments)  Causes skin to slough off.  . Ampicillin Itching and Rash    Starts on torso and hands. Spreads up to neck.  . Codeine Itching and Rash    Starts at trunk and hands. Spreads up to the neck.  Elyse Hsu [Shellfish Allergy] Hives and Itching    Review Of Systems:  Constitutional:   No  weight loss, night sweats,  Fevers, chills, fatigue, or  lassitude.  HEENT:   No headaches,  Difficulty swallowing,  Tooth/dental problems, or  Sore throat,                No sneezing, itching, ear ache, nasal congestion, post nasal drip,   CV:  No chest pain,  Orthopnea, PND, swelling in lower extremities, anasarca, dizziness, palpitations, syncope.   GI  No heartburn, indigestion, abdominal pain,  nausea, vomiting, diarrhea, change in bowel habits, loss of appetite, bloody stools.   Resp: No shortness of breath with exertion or at rest.  No excess mucus, no productive cough,  No non-productive cough,  No coughing up of blood.  No change in color of mucus.  No wheezing.  No chest wall deformity  Skin: no rash or lesions.  GU: no dysuria, change in color of urine, no urgency or frequency.  No flank pain, no hematuria   MS:  No joint pain or swelling.  No decreased range of motion.  No back pain.  Psych:  No change in mood or affect. No depression positive anxiety and stress due to upcoming school year [employer]  No memory loss.   Vital Signs BP (!) 170/88 (BP Location: Left Arm, Patient Position: Sitting, Cuff Size: Normal) Comment (BP Location): forearm Comment (Cuff Size): took BP on pt's forearm  Pulse (!) 110   Ht 5\' 7"  (1.702 m)   Wt (!) 369 lb 6.4 oz (167.6 kg)   SpO2 96%   BMI 57.86 kg/m    Physical Exam:  General- No distress,  A&Ox3, pleasant ENT: No sinus tenderness, TM clear, pale nasal mucosa, no oral exudate,no post nasal drip, no LAN Cardiac: S1, S2, regular rate and rhythm, no murmur Chest: No wheeze/ rales/ dullness; no accessory muscle use, no nasal flaring, no sternal retractions Abd.: Soft Non-tender, nondistended, obese Ext: No clubbing cyanosis, edema Neuro:  normal strength Skin: No rashes, warm and dry Psych: normal mood and behavior   Assessment/Plan  Obstructive sleep apnea Compliant with CPAP therapy Good control documented with download Feels better with new CPAP machine Plan Continue on CPAP at bedtime. You appear to be benefiting from the treatment Goal is to wear for at least 4-6 hours each night for maximal clinical benefit. Continue to work on weight loss, as the link between excess weight  and sleep apnea is well established.  Do not drive if sleepy. Clean tubing, filter, mask and reservoir once weekly with soapy  water. Please check your blood pressure at home. If it remains high please follow up with your PCP. Follow up with Dr. Annamaria Boots  In  12  months or before as needed.  Please contact office for sooner follow up if symptoms do not improve or worsen or seek emergency care       Magdalen Spatz, NP 11/17/2016  9:58 PM

## 2016-11-29 MED FILL — DULoxetine HCL 30 MG CPEP: 30 | 30 days supply | Qty: 30 | Fill #0

## 2016-12-07 MED FILL — TRESIBA FLEXTOUCH 200 UNITS: 200 | 90 days supply | Qty: 54 | Fill #0

## 2016-12-08 MED FILL — ATORVASTATIN 10 MG TABLET: 10 | 90 days supply | Qty: 90 | Fill #2

## 2016-12-08 MED FILL — NOVOLOG FLEXPEN SYRINGE: 100 | 90 days supply | Qty: 63 | Fill #2

## 2016-12-08 MED FILL — LEVOTHYROXINE 200 MCG TAB: 200 | 90 days supply | Qty: 90 | Fill #2

## 2016-12-08 MED FILL — UNIFINE PENTIPS 8MM 31G: 31G X 8 MM | 90 days supply | Qty: 300 | Fill #0

## 2016-12-08 MED FILL — SYNJARDY XR 10-1000 MG TB24: 10-1000 | 30 days supply | Qty: 30 | Fill #1

## 2016-12-21 DIAGNOSIS — C541 Malignant neoplasm of endometrium: Secondary | ICD-10-CM | POA: Diagnosis not present

## 2016-12-21 DIAGNOSIS — E038 Other specified hypothyroidism: Secondary | ICD-10-CM | POA: Diagnosis not present

## 2016-12-21 DIAGNOSIS — D649 Anemia, unspecified: Secondary | ICD-10-CM | POA: Diagnosis not present

## 2016-12-21 DIAGNOSIS — E668 Other obesity: Secondary | ICD-10-CM | POA: Diagnosis not present

## 2016-12-21 DIAGNOSIS — E041 Nontoxic single thyroid nodule: Secondary | ICD-10-CM | POA: Diagnosis not present

## 2016-12-21 DIAGNOSIS — I10 Essential (primary) hypertension: Secondary | ICD-10-CM | POA: Diagnosis not present

## 2016-12-21 DIAGNOSIS — E784 Other hyperlipidemia: Secondary | ICD-10-CM | POA: Diagnosis not present

## 2016-12-21 DIAGNOSIS — G4730 Sleep apnea, unspecified: Secondary | ICD-10-CM | POA: Diagnosis not present

## 2016-12-21 DIAGNOSIS — E1142 Type 2 diabetes mellitus with diabetic polyneuropathy: Secondary | ICD-10-CM | POA: Diagnosis not present

## 2016-12-21 DIAGNOSIS — E1149 Type 2 diabetes mellitus with other diabetic neurological complication: Secondary | ICD-10-CM | POA: Diagnosis not present

## 2016-12-21 DIAGNOSIS — Z6841 Body Mass Index (BMI) 40.0 and over, adult: Secondary | ICD-10-CM | POA: Diagnosis not present

## 2016-12-21 MED FILL — PANTOPRAZOLE SOD DR 40 MG T: 40 | 90 days supply | Qty: 90 | Fill #0

## 2016-12-22 DIAGNOSIS — H16203 Unspecified keratoconjunctivitis, bilateral: Secondary | ICD-10-CM | POA: Diagnosis not present

## 2016-12-22 MED FILL — LOSARTAN-HCTZ 100-25 MG TAB: 100-25 | 90 days supply | Qty: 90 | Fill #0

## 2016-12-22 MED FILL — FLUOROMETHOLONE 0.1% DROPS: 0.1 | 50 days supply | Qty: 5 | Fill #0

## 2016-12-23 DIAGNOSIS — Z23 Encounter for immunization: Secondary | ICD-10-CM | POA: Diagnosis not present

## 2017-01-03 MED FILL — DULoxetine HCL 30 MG CPEP: 30 | 30 days supply | Qty: 30 | Fill #1

## 2017-01-19 MED FILL — SYNJARDY XR 10-1000 MG TB24: 10-1000 | 30 days supply | Qty: 30 | Fill #2

## 2017-02-04 MED FILL — DULoxetine HCL 30 MG CPEP: 30 | 30 days supply | Qty: 30 | Fill #2

## 2017-02-16 MED FILL — SYNJARDY XR 10-1000 MG TB24: 10-1000 | 30 days supply | Qty: 30 | Fill #3

## 2017-03-04 MED FILL — NOVOLOG FLEXPEN SYRINGE: 100 | 90 days supply | Qty: 63 | Fill #3

## 2017-03-04 MED FILL — TRESIBA FLEXTOUCH 200 UNITS: 200 | 90 days supply | Qty: 54 | Fill #1

## 2017-03-04 MED FILL — UNIFINE PENTIPS 8MM 31G: 31G X 8 MM | 90 days supply | Qty: 300 | Fill #1

## 2017-03-04 MED FILL — DULoxetine HCL 30 MG CPEP: 30 | 30 days supply | Qty: 30 | Fill #3

## 2017-03-04 MED FILL — LEVOTHYROXINE 200 MCG TAB: 200 | 90 days supply | Qty: 90 | Fill #0

## 2017-03-04 MED FILL — ATORVASTATIN 10 MG TABLET: 10 | 90 days supply | Qty: 90 | Fill #0

## 2017-03-04 MED FILL — VALSARTAN-HCTZ 320-25 MG TA: 320-25 | 90 days supply | Qty: 90 | Fill #0

## 2017-03-11 DIAGNOSIS — H21543 Posterior synechiae (iris), bilateral: Secondary | ICD-10-CM | POA: Diagnosis not present

## 2017-03-11 DIAGNOSIS — E119 Type 2 diabetes mellitus without complications: Secondary | ICD-10-CM | POA: Diagnosis not present

## 2017-03-11 DIAGNOSIS — H52203 Unspecified astigmatism, bilateral: Secondary | ICD-10-CM | POA: Diagnosis not present

## 2017-03-11 DIAGNOSIS — H25813 Combined forms of age-related cataract, bilateral: Secondary | ICD-10-CM | POA: Diagnosis not present

## 2017-03-18 MED FILL — PANTOPRAZOLE SOD DR 40 MG T: 40 | 90 days supply | Qty: 90 | Fill #1

## 2017-03-18 MED FILL — SYNJARDY XR 10-1000 MG TB24: 10-1000 | 30 days supply | Qty: 30 | Fill #4

## 2017-03-27 IMAGING — US US THYROID
1 series · 14 of 25 positions shown · non-contrast
Comparison: 10/15/2013 thyroid ultrasound

CLINICAL DATA: 65 y/o  F; thyroid nodules for follow-up.

EXAM:
THYROID ULTRASOUND
TECHNIQUE: Ultrasound examination of the thyroid gland and adjacent soft
tissues was performed.

[Series 1: us thyroid · 0.08mm/px · 14 of 66 slices shown]
[im 1/66]
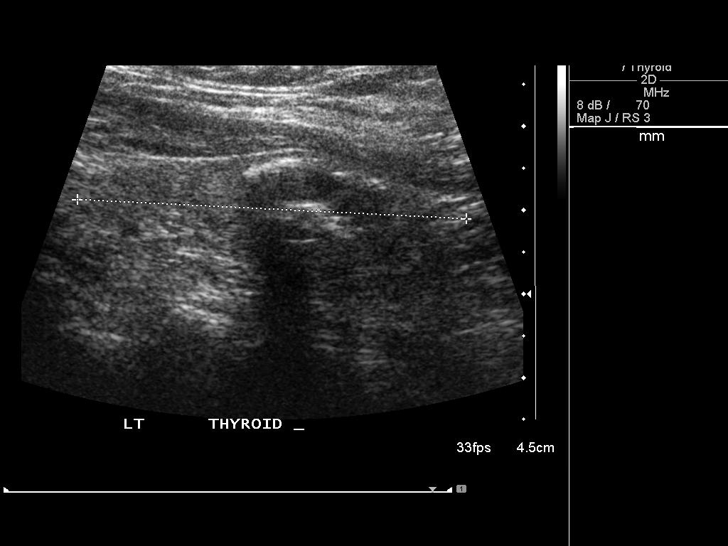
[im 6/66]
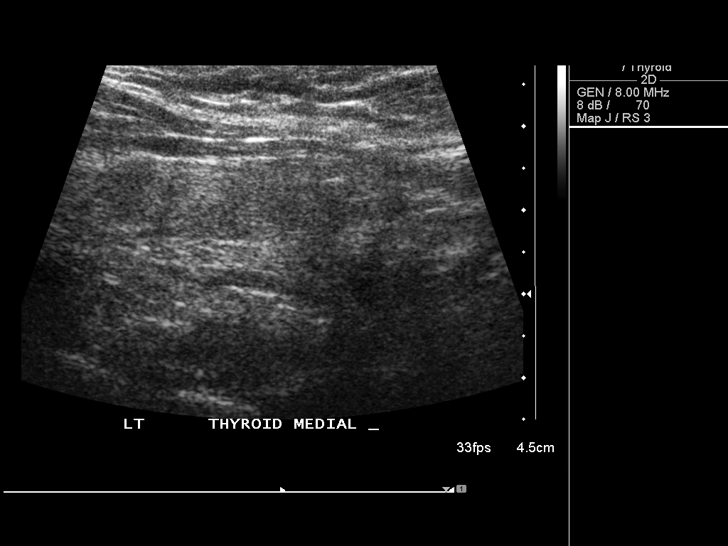
[im 11/66]
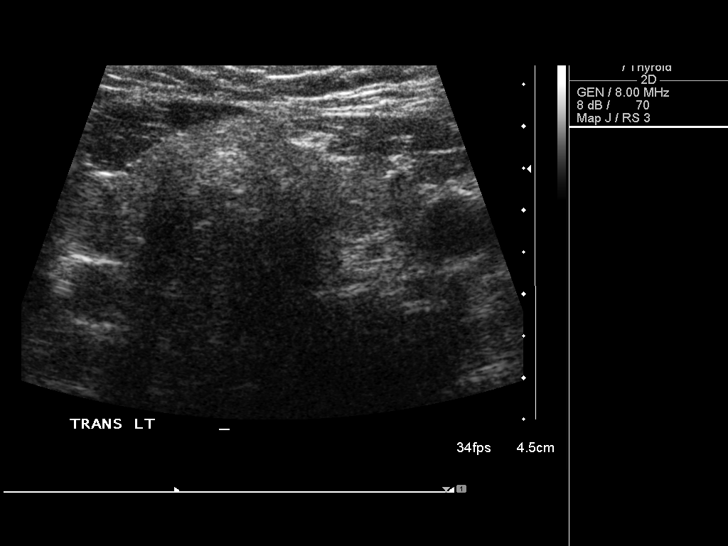
[im 17/66]
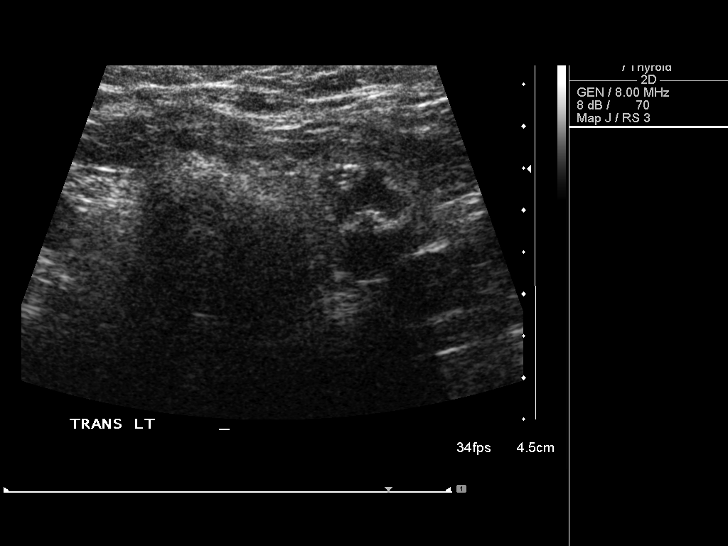
[im 22/66]
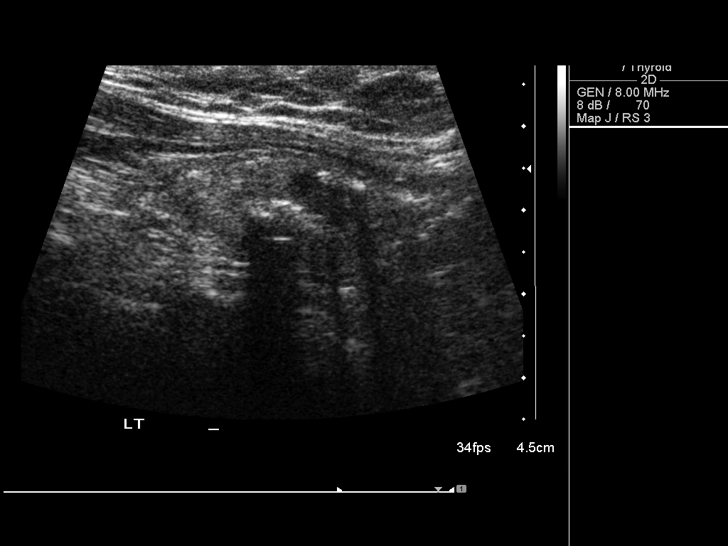
[im 25/66]
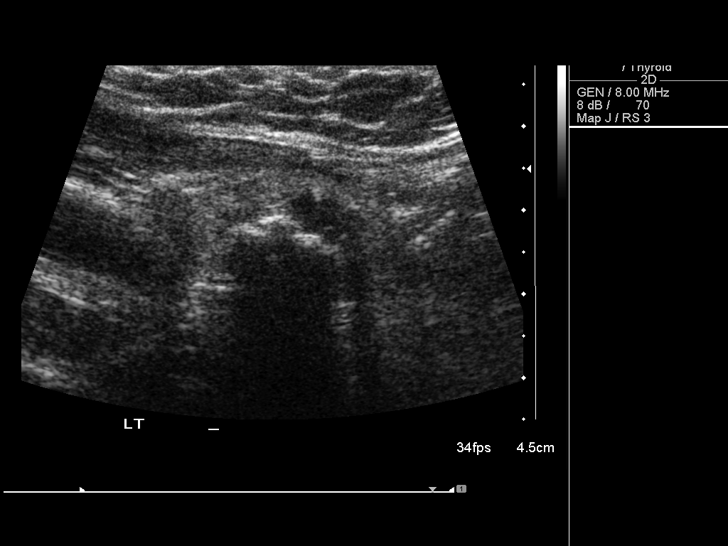
[im 30/66]
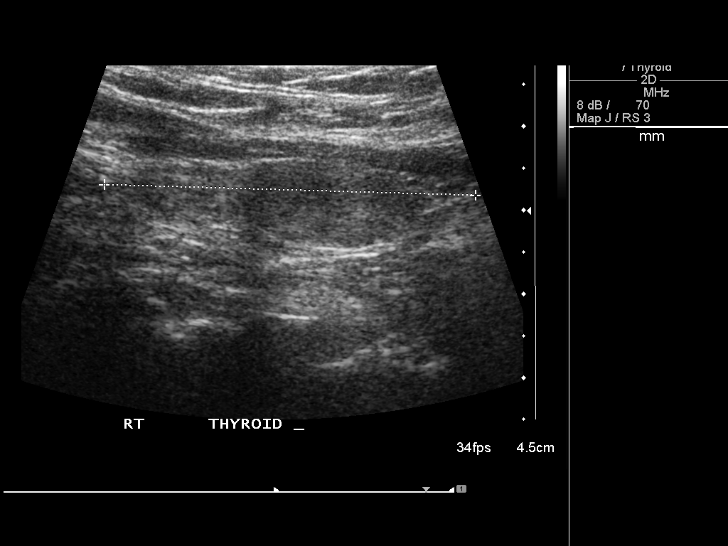
[im 36/66]
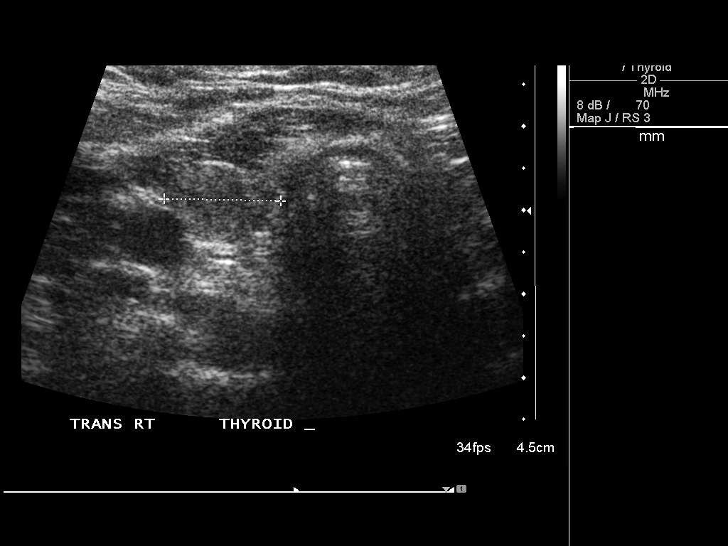
[im 41/66]
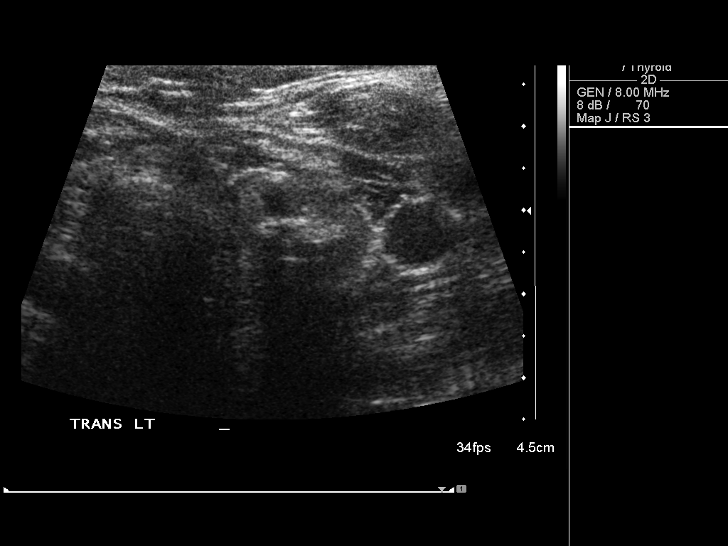
[im 44/66]
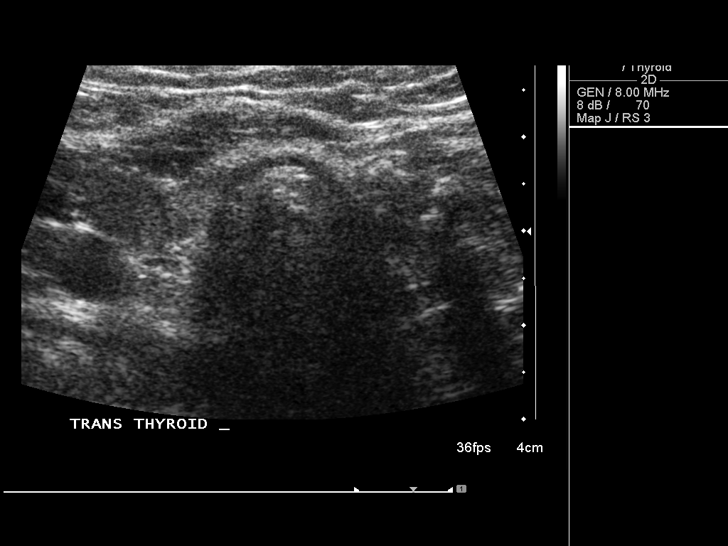
[im 49/66]
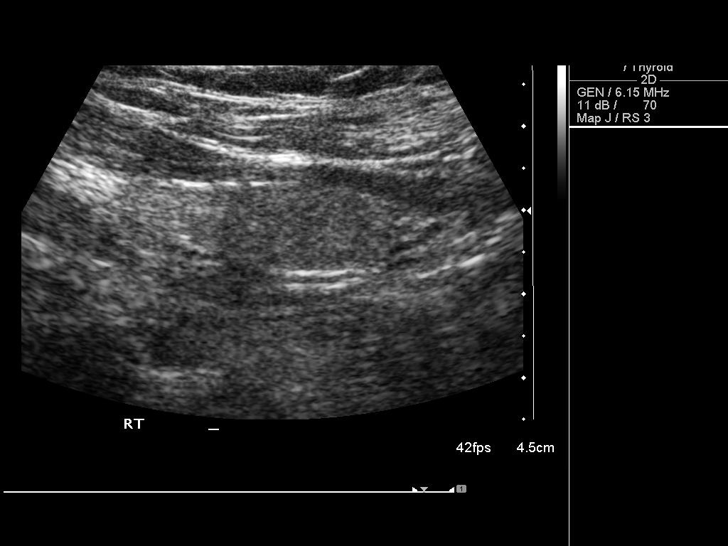
[im 55/66]
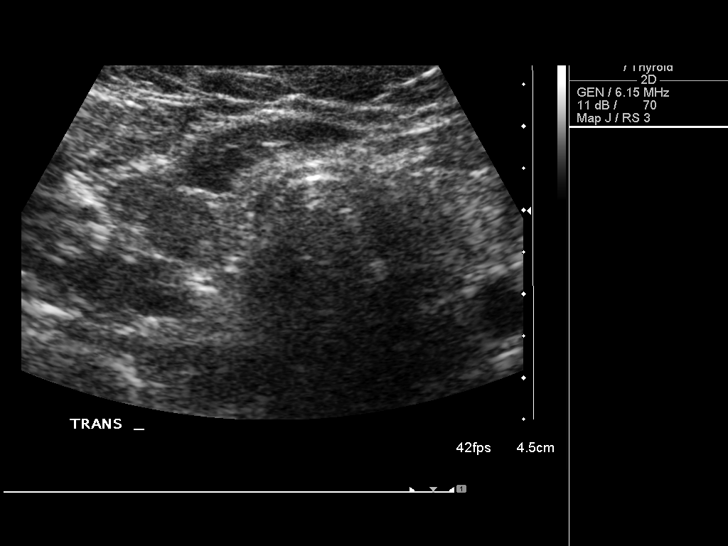
[im 60/66]
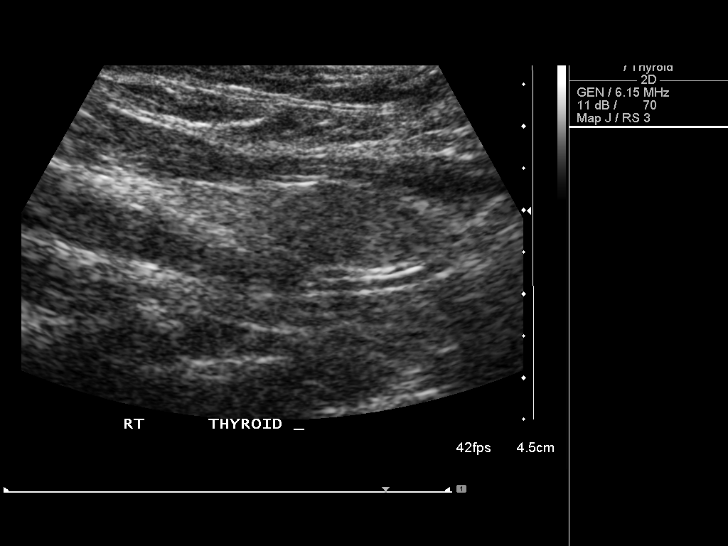
[im 66/66]
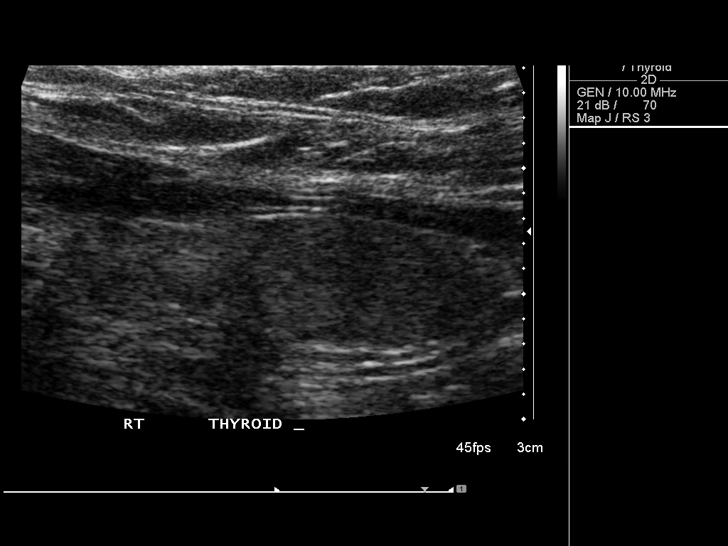

[14 of 25 positions shown; findings below may reference images not displayed]

FINDINGS: Right thyroid lobe

Measurements: 4.4 x 1.2 x 1.4 cm. Stable lower pole nodule measuring
1.5 x 1.0 x 1.4 cm, solid, isoechoic, wider than tall, smooth
margin, and without echogenic foci, *Given size (1.5 - 2.4 cm) and
appearance, a follow-up ultrasound at 3 and 5 years should be
considered based on TI-RADS criteria.

Left thyroid lobe

Measurements: 4.7 x 1.5 x 1.7 cm. Left thyroid lobe cystic,
hypoechoic, wider than tall, ill-defined nodule with
macrocalcifications grossly stable from prior ultrasound, This
nodule does NOT meet TI-RADS criteria for biopsy or dedicated
follow-up.

Isthmus

Thickness: 0.3 cm.  No nodules visualized.

Lymphadenopathy

None visualized.
IMPRESSION: 1. Stable right thyroid nodule, additional follow-up at 3 and 5 year
should be considered based on TI-RADS criteria.
2. Stable left thyroid nodule with microcalcifications.

By: Flori Balan Hoijere M.D.

## 2017-04-01 MED FILL — DULoxetine HCL 30 MG CPEP: 30 | 30 days supply | Qty: 30 | Fill #4

## 2017-04-18 MED FILL — SYNJARDY XR 10-1000 MG TB24: 10-1000 | 30 days supply | Qty: 30 | Fill #5

## 2017-05-02 DIAGNOSIS — E041 Nontoxic single thyroid nodule: Secondary | ICD-10-CM | POA: Diagnosis not present

## 2017-05-02 DIAGNOSIS — M79606 Pain in leg, unspecified: Secondary | ICD-10-CM | POA: Diagnosis not present

## 2017-05-02 DIAGNOSIS — D6489 Other specified anemias: Secondary | ICD-10-CM | POA: Diagnosis not present

## 2017-05-02 DIAGNOSIS — E1149 Type 2 diabetes mellitus with other diabetic neurological complication: Secondary | ICD-10-CM | POA: Diagnosis not present

## 2017-05-02 DIAGNOSIS — E1142 Type 2 diabetes mellitus with diabetic polyneuropathy: Secondary | ICD-10-CM | POA: Diagnosis not present

## 2017-05-02 DIAGNOSIS — Z1389 Encounter for screening for other disorder: Secondary | ICD-10-CM | POA: Diagnosis not present

## 2017-05-02 DIAGNOSIS — E668 Other obesity: Secondary | ICD-10-CM | POA: Diagnosis not present

## 2017-05-02 DIAGNOSIS — E7849 Other hyperlipidemia: Secondary | ICD-10-CM | POA: Diagnosis not present

## 2017-05-02 DIAGNOSIS — E559 Vitamin D deficiency, unspecified: Secondary | ICD-10-CM | POA: Diagnosis not present

## 2017-05-02 DIAGNOSIS — Z6841 Body Mass Index (BMI) 40.0 and over, adult: Secondary | ICD-10-CM | POA: Diagnosis not present

## 2017-05-09 MED FILL — DULoxetine HCL 30 MG CPEP: 30 | 30 days supply | Qty: 30 | Fill #5

## 2017-05-23 MED FILL — SYNJARDY XR 10-1000 MG TB24: 10-1000 | 30 days supply | Qty: 30 | Fill #0

## 2017-05-27 MED FILL — TRESIBA FLEXTOUCH 200 UNITS: 200 | 90 days supply | Qty: 54 | Fill #2

## 2017-05-27 MED FILL — LEVOTHYROXINE 200 MCG TAB: 200 | 90 days supply | Qty: 90 | Fill #1

## 2017-05-27 MED FILL — UNIFINE PENTIPS 8MM 31G: 31G X 8 MM | 90 days supply | Qty: 300 | Fill #2

## 2017-05-27 MED FILL — NOVOLOG FLEXPEN SYRINGE: 100 | 25 days supply | Qty: 18 | Fill #4

## 2017-06-06 ENCOUNTER — Other Ambulatory Visit: Payer: Self-pay | Admitting: Obstetrics and Gynecology

## 2017-06-06 DIAGNOSIS — Z1231 Encounter for screening mammogram for malignant neoplasm of breast: Secondary | ICD-10-CM

## 2017-06-08 MED FILL — DULoxetine HCL 30 MG CPEP: 30 | 30 days supply | Qty: 30 | Fill #6

## 2017-06-22 MED FILL — PANTOPRAZOLE SOD DR 40 MG T: 40 | 90 days supply | Qty: 90 | Fill #2

## 2017-06-22 MED FILL — SYNJARDY XR 10-1000 MG TB24: 10-1000 | 30 days supply | Qty: 30 | Fill #1

## 2017-06-30 DIAGNOSIS — H25813 Combined forms of age-related cataract, bilateral: Secondary | ICD-10-CM | POA: Diagnosis not present

## 2017-06-30 DIAGNOSIS — H52203 Unspecified astigmatism, bilateral: Secondary | ICD-10-CM | POA: Diagnosis not present

## 2017-06-30 DIAGNOSIS — H5203 Hypermetropia, bilateral: Secondary | ICD-10-CM | POA: Diagnosis not present

## 2017-06-30 DIAGNOSIS — H04123 Dry eye syndrome of bilateral lacrimal glands: Secondary | ICD-10-CM | POA: Diagnosis not present

## 2017-07-12 MED FILL — DULoxetine HCL 30 MG CPEP: 30 | 30 days supply | Qty: 30 | Fill #0

## 2017-07-18 ENCOUNTER — Ambulatory Visit
Admission: RE | Admit: 2017-07-18 | Discharge: 2017-07-18 | Disposition: A | Discharge status: Home / Self Care | Payer: BLUE CROSS/BLUE SHIELD | Source: Ambulatory Visit | Attending: Obstetrics and Gynecology | Admitting: Obstetrics and Gynecology

## 2017-07-18 DIAGNOSIS — Z1231 Encounter for screening mammogram for malignant neoplasm of breast: Secondary | ICD-10-CM | POA: Diagnosis not present

## 2017-07-25 MED FILL — SYNJARDY XR 10-1000 MG TB24: 10-1000 | 30 days supply | Qty: 30 | Fill #2

## 2017-07-25 MED FILL — NOVOLOG FLEXPEN SYRINGE: 100 | 85 days supply | Qty: 60 | Fill #0

## 2017-07-25 MED FILL — VALSARTAN-HCTZ 320-25 MG TA: 320-25 | 90 days supply | Qty: 90 | Fill #1

## 2017-08-08 MED FILL — DULoxetine HCL 30 MG CPEP: 30 | 30 days supply | Qty: 30 | Fill #1

## 2017-08-15 MED FILL — ATORVASTATIN 10 MG TABLET: 10 | 90 days supply | Qty: 90 | Fill #1

## 2017-08-22 MED FILL — SYNJARDY XR 10-1000 MG TB24: 10-1000 | 30 days supply | Qty: 30 | Fill #3

## 2017-08-24 MED FILL — TRESIBA FLEXTOUCH 200 UNITS: 200 | 90 days supply | Qty: 54 | Fill #3

## 2017-08-24 MED FILL — UNIFINE PENTIPS 8MM 31G: 31G X 8 MM | 90 days supply | Qty: 300 | Fill #3

## 2017-08-24 MED FILL — LEVOTHYROXINE 200 MCG TAB: 200 | 90 days supply | Qty: 90 | Fill #2

## 2017-08-25 DIAGNOSIS — Z6841 Body Mass Index (BMI) 40.0 and over, adult: Secondary | ICD-10-CM | POA: Diagnosis not present

## 2017-08-25 DIAGNOSIS — N958 Other specified menopausal and perimenopausal disorders: Secondary | ICD-10-CM | POA: Diagnosis not present

## 2017-08-25 DIAGNOSIS — Z01419 Encounter for gynecological examination (general) (routine) without abnormal findings: Secondary | ICD-10-CM | POA: Diagnosis not present

## 2017-08-27 ENCOUNTER — Encounter (HOSPITAL_COMMUNITY): Payer: Self-pay

## 2017-08-27 ENCOUNTER — Other Ambulatory Visit: Payer: Self-pay

## 2017-08-27 ENCOUNTER — Ambulatory Visit (HOSPITAL_COMMUNITY)
Admission: EM | Admit: 2017-08-27 | Discharge: 2017-08-27 | Disposition: A | Discharge status: Home / Self Care | Payer: Medicare Other | Attending: Family Medicine | Admitting: Family Medicine

## 2017-08-27 DIAGNOSIS — R81 Glycosuria: Secondary | ICD-10-CM

## 2017-08-27 DIAGNOSIS — E118 Type 2 diabetes mellitus with unspecified complications: Secondary | ICD-10-CM

## 2017-08-27 DIAGNOSIS — N3001 Acute cystitis with hematuria: Secondary | ICD-10-CM | POA: Diagnosis not present

## 2017-08-27 LAB — POCT URINALYSIS DIP (DEVICE)
Glucose, UA: 500 mg/dL — AB
Ketones, ur: 15 mg/dL — AB
Nitrite: NEGATIVE
Protein, ur: 100 mg/dL — AB
Specific Gravity, Urine: 1.02 (ref 1.005–1.030)
Urobilinogen, UA: 0.2 mg/dL (ref 0.0–1.0)
pH: 5.5 (ref 5.0–8.0)

## 2017-08-27 MED ORDER — NITROFURANTOIN MONOHYD MACRO 100 MG PO CAPS: 100.0000 mg | ORAL_CAPSULE | Freq: Two times a day (BID) | ORAL | 0 refills | Status: AC

## 2017-08-27 NOTE — Discharge Instructions (Addendum)
Drink fluids and get plenty of rest Take antibiotic as prescribed Follow up with PCP if symptoms persists Return or go to ER if you have any new or worsening symptoms  Elevated glucose in urine recommend that you follow up with PCP this week for further evaluation and management

## 2017-08-27 NOTE — ED Triage Notes (Signed)
Pt presents today with body aches that started yesterday. Has had fever, dizziness, and weakness. Today had a fever of 101.5. Elevated BS for about a week as well.

## 2017-08-27 NOTE — ED Provider Notes (Signed)
[place]   [id number] 08/27/17 Arrival Time: 4627  SUBJECTIVE: History from: patient.  [name] is a 67 y.o. female hx significant for previous infected mesh, DM, HTN, and HDL, who presents with abrupt onset of fever that began 1 day ago.  Tmax of 101.5 at home, 99.2 in office.  Denies positive sick exposure or precipitating event. Complains of increased urinary frequency, but denies dysuria, or increased urgency. Has NOT tried OTC medications without relief.  Denies aggravating symptoms.  Denies previous symptoms in the past.  Complains of chills, fatigue, and loose stools.  Denies sinus pain, rhinorrhea, nasal congestion, sore throat, cough, SOB, wheezing, chest pain, nausea, vomiting, abdominal pain    Infected mesh in 2014 treated with duricef and cipro for extended period of time following operation. Most recent course of antibiotics 1 year ago for recurrent infection.   ROS: As per HPI.  Past Medical History:  Diagnosis Date  . Diabetes mellitus   . Hyperlipidemia   . Hypertension   . Joint pain   . Obesity   . Sinus complaint   . Thyroid cancer ([place])   . Thyroid disease   . Uterine cancer ([place])   . Wears glasses    Past Surgical History:  Procedure Laterality Date  . ABDOMINAL HYSTERECTOMY    . APPENDECTOMY    . CESAREAN SECTION    . GASTRIC RESTRICTION SURGERY  04/04/2007   lap band. removed 09/28/12  . HERNIA REPAIR    . hernia repair surgery     09/2012 with skin grafts  . TONSILLECTOMY AND ADENOIDECTOMY     Allergies  Allergen Reactions  . Oxaprozin Other (See Comments)    Causes skin to slough off.  . Ampicillin Itching and Rash    Starts on torso and hands. Spreads up to neck.  . Codeine Itching and Rash    Starts at trunk and hands. Spreads up to the neck.  Elyse Hsu [Shellfish Allergy] Hives and Itching   No current facility-administered medications on file prior to encounter.    Current Outpatient Medications on File Prior to  Encounter  Medication Sig Dispense Refill  . aspirin 81 MG tablet Take 81 mg by mouth daily.      Marland Kitchen atorvastatin (LIPITOR) 10 MG tablet Take 10 mg by mouth daily.    Marland Kitchen CALCIUM-MAGNESUIUM-ZINC 333-133-8.3 MG TABS Take 1 tablet by mouth daily.    Marland Kitchen docusate sodium (COLACE) 100 MG capsule Take 100 mg by mouth 2 (two) times daily.     . DULoxetine (CYMBALTA) 30 MG capsule Take 1 capsule by mouth daily.    . Empagliflozin-Metformin HCl ER (SYNJARDY XR) 01-999 MG TB24 Take 1 tablet by mouth daily.    Marland Kitchen levothyroxine (SYNTHROID, LEVOTHROID) 200 MCG tablet Take 200 mcg by mouth daily.      . Multiple Vitamin (MULTIVITAMIN) tablet Take 1 tablet by mouth daily.    Marland Kitchen NOVOLOG FLEXPEN 100 UNIT/ML FlexPen Inject 25-35 Units as directed 2 (two) times daily.  2  . olopatadine (PATANOL) 0.1 % ophthalmic solution Place 1 drop into the left eye 2 (two) times daily. 5 mL 12  . ONE TOUCH ULTRA TEST test strip   6  . pantoprazole (PROTONIX) 40 MG tablet Take 40 mg by mouth daily.    . TRESIBA FLEXTOUCH 200 UNIT/ML SOPN Inject 120 Units as directed every morning.    Marland Kitchen UNIFINE PENTIPS 31G X 5 MM MISC   4  . valsartan-hydrochlorothiazide (DIOVAN HCT) 320-25 MG per  tablet Take 1 tablet by mouth daily.     Social History   Socioeconomic History  . Marital status: Married    Spouse name: Not on file  . Number of children: Not on file  . Years of education: Not on file  . Highest education level: Not on file  Occupational History  . Not on file  Social Needs  . Financial resource strain: Not on file  . Food insecurity:    Worry: Not on file    Inability: Not on file  . Transportation needs:    Medical: Not on file    Non-medical: Not on file  Tobacco Use  . Smoking status: Never Smoker  . Smokeless tobacco: Never Used  Substance and Sexual Activity  . Alcohol use: No  . Drug use: No  . Sexual activity: Not on file  Lifestyle  . Physical activity:    Days per week: Not on file    Minutes per  session: Not on file  . Stress: Not on file  Relationships  . Social connections:    Talks on phone: Not on file    Gets together: Not on file    Attends religious service: Not on file    Active member of club or organization: Not on file    Attends meetings of clubs or organizations: Not on file    Relationship status: Not on file  . Intimate partner violence:    Fear of current or ex partner: Not on file    Emotionally abused: Not on file    Physically abused: Not on file    Forced sexual activity: Not on file  Other Topics Concern  . Not on file  Social History Narrative  . Not on file   Family History  Problem Relation Age of Onset  . Heart disease Mother   . COPD Mother   . Colon polyps Mother   . Prostate cancer Father   . Colon polyps Father   . Diabetes Sister   . Asthma Sister   . Fibromyalgia Sister   . Colon cancer Neg Hx   . Pancreatic cancer Neg Hx   . Stomach cancer Neg Hx     OBJECTIVE:  Vitals:   08/27/17 1846  BP: 120/81  Pulse: 93  Resp: 20  Temp: 99.2 F (37.3 C)  TempSrc: Oral  SpO2: 93%     General appearance: AOx3; not in acute distress; sitting in  HEENT: Ears: EACs clear, TMs pearly gray with visible cone of light, without erythema; Eyes: PERRL, EOMI grossly; Sinuses nontender to palpation; Nose: no rhinorrhea; Throat: oropharynx clear, nonerythematous, uvula midline Neck: supple without LAD Lungs: CTA bilaterally without adventitious breath sounds Heart: regular rate and rhythm.  Radial pulses 2+ symmetrical bilaterally Back: No CVA tenderness Abdomen: Protuberant; significant surgical scar following infected mesh operation; normal active bowel sounds; nontender to palpation; no guarding Skin: warm and dry Psychological: alert and cooperative; normal mood and affect  Imaging: No results found.  Results for orders placed or performed during the hospital encounter of 08/27/17 (from the past 24 hour(s))  POCT urinalysis dip (device)      Status: Abnormal   Collection Time: 08/27/17  7:49 PM  Result Value Ref Range   Glucose, UA 500 (A) NEGATIVE mg/dL   Bilirubin Urine SMALL (A) NEGATIVE   Ketones, ur 15 (A) NEGATIVE mg/dL   Specific Gravity, Urine 1.020 1.005 - 1.030   Hgb urine dipstick MODERATE (A) NEGATIVE   pH 5.5  5.0 - 8.0   Protein, ur 100 (A) NEGATIVE mg/dL   Urobilinogen, UA 0.2 0.0 - 1.0 mg/dL   Nitrite NEGATIVE NEGATIVE   Leukocytes, UA TRACE (A) NEGATIVE   Could not obtain culture, due to minimal amount of urine.    ASSESSMENT & PLAN:  1. Acute cystitis with hematuria   2. Glucosuria   3. Type 2 diabetes mellitus with complication, unspecified whether long term insulin use ([place])     Meds ordered this encounter  Medications  . nitrofurantoin, macrocrystal-monohydrate, (MACROBID) 100 MG capsule    Sig: Take 1 capsule (100 mg total) by mouth 2 (two) times daily for 7 days.    Dispense:  14 capsule    Refill:  0    Order Specific Question:   Supervising Provider    Answer:   Wynona Luna [[id number]]    Drink fluids and get plenty of rest Take antibiotic as prescribed Follow up with PCP if symptoms persists Return or go to ER if you have any new or worsening symptoms  Elevated glucose in urine recommend that you follow up with PCP this week for further evaluation and management   Reviewed expectations re: course of current medical issues. Questions answered. Outlined signs and symptoms indicating need for more acute intervention. Patient verbalized understanding. After Visit Summary given.         Lestine Box, PA-C 08/27/17 2015

## 2017-09-05 MED FILL — DULoxetine HCL 30 MG CPEP: 30 | 30 days supply | Qty: 30 | Fill #2

## 2017-09-06 DIAGNOSIS — E038 Other specified hypothyroidism: Secondary | ICD-10-CM | POA: Diagnosis not present

## 2017-09-06 DIAGNOSIS — D6489 Other specified anemias: Secondary | ICD-10-CM | POA: Diagnosis not present

## 2017-09-06 DIAGNOSIS — F3289 Other specified depressive episodes: Secondary | ICD-10-CM | POA: Diagnosis not present

## 2017-09-06 DIAGNOSIS — E668 Other obesity: Secondary | ICD-10-CM | POA: Diagnosis not present

## 2017-09-06 DIAGNOSIS — E1149 Type 2 diabetes mellitus with other diabetic neurological complication: Secondary | ICD-10-CM | POA: Diagnosis not present

## 2017-09-06 DIAGNOSIS — I10 Essential (primary) hypertension: Secondary | ICD-10-CM | POA: Diagnosis not present

## 2017-09-06 DIAGNOSIS — E7849 Other hyperlipidemia: Secondary | ICD-10-CM | POA: Diagnosis not present

## 2017-09-06 DIAGNOSIS — E1142 Type 2 diabetes mellitus with diabetic polyneuropathy: Secondary | ICD-10-CM | POA: Diagnosis not present

## 2017-09-06 DIAGNOSIS — Z6841 Body Mass Index (BMI) 40.0 and over, adult: Secondary | ICD-10-CM | POA: Diagnosis not present

## 2017-09-06 DIAGNOSIS — E041 Nontoxic single thyroid nodule: Secondary | ICD-10-CM | POA: Diagnosis not present

## 2017-09-06 DIAGNOSIS — C541 Malignant neoplasm of endometrium: Secondary | ICD-10-CM | POA: Diagnosis not present

## 2017-09-06 DIAGNOSIS — G4730 Sleep apnea, unspecified: Secondary | ICD-10-CM | POA: Diagnosis not present

## 2017-09-06 MED FILL — VOLTAREN 1% GEL: 1 | 8 days supply | Qty: 100 | Fill #0

## 2017-09-07 DIAGNOSIS — E559 Vitamin D deficiency, unspecified: Secondary | ICD-10-CM | POA: Diagnosis not present

## 2017-09-07 DIAGNOSIS — E1149 Type 2 diabetes mellitus with other diabetic neurological complication: Secondary | ICD-10-CM | POA: Diagnosis not present

## 2017-09-07 DIAGNOSIS — D649 Anemia, unspecified: Secondary | ICD-10-CM | POA: Diagnosis not present

## 2017-09-07 DIAGNOSIS — D6489 Other specified anemias: Secondary | ICD-10-CM | POA: Diagnosis not present

## 2017-09-07 DIAGNOSIS — E038 Other specified hypothyroidism: Secondary | ICD-10-CM | POA: Diagnosis not present

## 2017-09-07 DIAGNOSIS — E7849 Other hyperlipidemia: Secondary | ICD-10-CM | POA: Diagnosis not present

## 2017-09-07 MED FILL — VIT D2 1.25 MG (50,000 UNIT: 1.25 MG | 28 days supply | Qty: 4 | Fill #0

## 2017-09-14 MED FILL — TOBRADEX EYE OINTMENT: 0.3-0.1 | 7 days supply | Qty: 4 | Fill #0

## 2017-09-14 MED FILL — PANTOPRAZOLE SOD DR 40 MG T: 40 | 90 days supply | Qty: 90 | Fill #3

## 2017-09-15 MED FILL — SYNJARDY XR 10-1000 MG TB24: 10-1000 | 30 days supply | Qty: 30 | Fill #4

## 2017-09-20 DIAGNOSIS — H268 Other specified cataract: Secondary | ICD-10-CM | POA: Diagnosis not present

## 2017-09-20 DIAGNOSIS — H21561 Pupillary abnormality, right eye: Secondary | ICD-10-CM | POA: Diagnosis not present

## 2017-09-20 DIAGNOSIS — H5201 Hypermetropia, right eye: Secondary | ICD-10-CM | POA: Diagnosis not present

## 2017-09-20 DIAGNOSIS — H25811 Combined forms of age-related cataract, right eye: Secondary | ICD-10-CM | POA: Diagnosis not present

## 2017-09-20 DIAGNOSIS — H21541 Posterior synechiae (iris), right eye: Secondary | ICD-10-CM | POA: Diagnosis not present

## 2017-09-20 MED FILL — POLY-IRON 150 MG CAPSULE: 150 | 30 days supply | Qty: 30 | Fill #0

## 2017-09-21 ENCOUNTER — Ambulatory Visit: Payer: Medicare Other | Admitting: Internal Medicine

## 2017-09-21 MED FILL — NOVOLOG FLEXPEN SYRINGE: 100 | 30 days supply | Qty: 33 | Fill #0

## 2017-09-23 ENCOUNTER — Other Ambulatory Visit: Payer: Self-pay

## 2017-09-23 DIAGNOSIS — I83893 Varicose veins of bilateral lower extremities with other complications: Secondary | ICD-10-CM

## 2017-09-27 DIAGNOSIS — Z124 Encounter for screening for malignant neoplasm of cervix: Secondary | ICD-10-CM | POA: Diagnosis not present

## 2017-10-10 MED FILL — VIT D2 1.25 MG (50,000 UNIT: 1.25 MG | 28 days supply | Qty: 4 | Fill #1

## 2017-10-10 MED FILL — DULoxetine HCL 30 MG CPEP: 30 | 30 days supply | Qty: 30 | Fill #3

## 2017-10-11 DIAGNOSIS — H21542 Posterior synechiae (iris), left eye: Secondary | ICD-10-CM | POA: Diagnosis not present

## 2017-10-11 DIAGNOSIS — H269 Unspecified cataract: Secondary | ICD-10-CM | POA: Diagnosis not present

## 2017-10-11 DIAGNOSIS — H5704 Mydriasis: Secondary | ICD-10-CM | POA: Diagnosis not present

## 2017-10-11 DIAGNOSIS — H21562 Pupillary abnormality, left eye: Secondary | ICD-10-CM | POA: Diagnosis not present

## 2017-10-11 DIAGNOSIS — H25811 Combined forms of age-related cataract, right eye: Secondary | ICD-10-CM | POA: Diagnosis not present

## 2017-10-24 MED FILL — SYNJARDY XR 10-1000 MG TB24: 10-1000 | 30 days supply | Qty: 30 | Fill #5

## 2017-10-24 MED FILL — VALSARTAN-HCTZ 320-25 MG TA: 320-25 | 90 days supply | Qty: 90 | Fill #2

## 2017-10-24 MED FILL — POLY-IRON 150 MG CAPSULE: 150 | 30 days supply | Qty: 30 | Fill #1

## 2017-10-31 ENCOUNTER — Encounter: Payer: Self-pay | Admitting: Internal Medicine

## 2017-11-01 ENCOUNTER — Encounter: Payer: Self-pay | Admitting: Vascular Surgery

## 2017-11-01 ENCOUNTER — Encounter: Payer: Self-pay | Admitting: Internal Medicine

## 2017-11-01 ENCOUNTER — Other Ambulatory Visit: Payer: Self-pay

## 2017-11-01 ENCOUNTER — Ambulatory Visit (INDEPENDENT_AMBULATORY_CARE_PROVIDER_SITE_OTHER): Payer: Medicare Other | Admitting: Vascular Surgery

## 2017-11-01 ENCOUNTER — Ambulatory Visit (INDEPENDENT_AMBULATORY_CARE_PROVIDER_SITE_OTHER): Payer: Medicare Other | Admitting: Internal Medicine

## 2017-11-01 ENCOUNTER — Ambulatory Visit (HOSPITAL_COMMUNITY)
Admission: RE | Admit: 2017-11-01 | Discharge: 2017-11-01 | Disposition: A | Discharge status: Home / Self Care | Payer: Medicare Other | Source: Ambulatory Visit | Attending: Vascular Surgery | Admitting: Vascular Surgery

## 2017-11-01 VITALS — BP 136/78 | HR 85 | Wt 369.0 lb

## 2017-11-01 DIAGNOSIS — I83893 Varicose veins of bilateral lower extremities with other complications: Secondary | ICD-10-CM | POA: Diagnosis not present

## 2017-11-01 DIAGNOSIS — I872 Venous insufficiency (chronic) (peripheral): Secondary | ICD-10-CM | POA: Diagnosis not present

## 2017-11-01 DIAGNOSIS — G4733 Obstructive sleep apnea (adult) (pediatric): Secondary | ICD-10-CM

## 2017-11-01 NOTE — Progress Notes (Signed)
Vitals:   11/01/17 1615  BP: (!) 149/76  Pulse: 83  Resp: 16  Temp: 97.6 F (36.4 C)  TempSrc: Oral  SpO2: 96%  Weight: (!) 365 lb (165.6 kg)  Height: 5\' 7"  (1.702 m)

## 2017-11-01 NOTE — Progress Notes (Signed)
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 FVC 2.65/76%, FEV1 1.96/74%, ratio 0.74, FEF 25-75 percent 1.44/63%. NPSG 02/13/01 AHI 26/hr  ----------------------------------------------------------------------------------------y.  09/21/16- 67 year old female never smoker, retired Software engineer, followed for OSA, located by morbid obesity, DM 2 CPAP 15/[facility]> today to [place] auto 10-20 Follow up, CPAP AHC, download attached. Pt wants to change from [facility] to [place]. Pt states CPAP currently broken needs repaired. She continues to say that she "loves my CPAP". Download confirms excellent compliance 94%/4 hour, AHI 0.4/hour. She has been very compliant with no break in therapy. She now would like to change DME company and replace broken machine which is 76-2 years old. Otherwise breathing has been comfortable with no significant health events to report.  11/01/2017  67 year old female never smoker, retired Software engineer, followed for OSA, located by morbid obesity, DM 2 CPAP auto 10-20/[facility] -----OSA: DME [facility] Pt states she is wearing CPAP nightly and DL attached.  Weight today 369 pounds Download compliance 100% AHI 0.3/hour.  She is very happy with her new machine and with AutoPap.  She chooses not to use humidifier and accepts some nasal stuffiness and congestion in the mornings.  We talked about saline nasal gel to help nasal dryness. 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  ROS-see HPI     + = positive Constitutional:    weight loss, No- night sweats, fevers, chills, fatigue, lassitude. HEENT:   No-  headaches, difficulty swallowing, tooth/dental problems, sore throat,       No-  sneezing, itching, ear ache, nasal congestion, +post nasal drip,  CV:  No-   chest pain, orthopnea, PND, swelling in lower extremities, anasarca,  dizziness, palpitations Resp: No-  acute  shortness of breath with exertion or at rest.              No-   productive cough,  No non-productive cough,  No- coughing up of blood.              No-   change in color of mucus.  No- wheezing.   Skin: No-   rash or lesions. GI:  No-   heartburn, indigestion, abdominal pain, nausea, vomiting,  GU: MS:  No-   joint pain or swelling.   Neuro-     nothing unusual Psych:  No- change in mood or affect. No depression or anxiety.  No memory loss.  OBJ- Physical Exam General- Alert, Oriented, Affect-appropriate, Distress- none acute, + morbid obesity, . Skin- rash-none, lesions- none, excoriation- none Lymphadenopathy- none Head- atraumatic            Eyes- Gross vision intact, PERRLA, conjunctivae and secretions clear            Ears- Hearing, canals-normal            Nose- Clear, no-Septal dev, No-polyps, erosion, perforation             Throat- Mallampati IV , mucosa clear , drainage- none, tonsils- atrophic Neck- flexible , trachea midline, no stridor , thyroid nl, carotid no bruit Chest - symmetrical excursion , unlabored           Heart/CV- RRR , 1/6 AS murmur , no gallop  , no rub, nl s1 s2                           - JVD- none , edema- none, stasis  changes- none, varices- none           Lung- clear to P&A, wheeze- none, cough- none , dullness-none, rub- none           Chest wall-  Abd-  Br/ Gen/ Rectal- Not done, not indicated Extrem- cyanosis- none, clubbing, none, atrophy- none, strength- nl Neuro- grossly intact to observation

## 2017-11-01 NOTE — Patient Instructions (Addendum)
We can continue CPAP auto 10-20, mask of choice, humidifier, supplies, airview  Suggest you contact [facility] for supplies  Try nasal saline gel for dryness                                                [facility] for replacement supplies  We discussed trying nasal saline gel to see if that helps dry stuffy nose  Please call if we can help

## 2017-11-01 NOTE — Assessment & Plan Note (Signed)
She is well aware of the medical concerns associated with her body weight and has failed lap band surgery.  Continued effort is encouraged.

## 2017-11-01 NOTE — Progress Notes (Signed)
Requested by:  Reynold Bowen, [address] [place], [place] [id number]  Reason for consultation: edema, pain, itching R>L LE    History of Present Illness   [name] is a 67 y.o. ([birth year]) female who presents with chief complaint: of pain, edema, itching and discoloration of RLE more so than LLE.  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  She has never worn compression stockings and does not elevate her feet during the day.  She denies history of DVT, varicose veins, or venous ulcerations.  She is taking an aspirin and statin daily.  She denies tobacco use.  PMH significant for morbid obesity and IDDM.  Past Medical History:  Diagnosis Date  . Diabetes mellitus   . Hyperlipidemia   . Hypertension   . Joint pain   . Obesity   . Sinus complaint   . Thyroid cancer ([place])   . Thyroid disease   . Uterine cancer ([place])   . Wears glasses     Past Surgical History:  Procedure Laterality Date  . ABDOMINAL HYSTERECTOMY    . APPENDECTOMY    . CESAREAN SECTION    . GASTRIC RESTRICTION SURGERY  04/04/2007   lap band. removed 09/28/12  . HERNIA REPAIR    . hernia repair surgery     09/2012 with skin grafts  . TONSILLECTOMY AND ADENOIDECTOMY      Social History   Socioeconomic History  . Marital status: Married    Spouse name: Not on file  . Number of children: Not on file  . Years of education: Not on file  . Highest education level: Not on file  Occupational History  . Not on file  Social Needs  . Financial resource strain: Not on file  . Food insecurity:    Worry: Not on file    Inability: Not on file  . Transportation needs:    Medical: Not on file    Non-medical: Not on file  Tobacco Use  . Smoking status: Never Smoker  . Smokeless tobacco: Never Used  Substance and Sexual Activity  . Alcohol use: No  . Drug use: No  . Sexual activity: Not on file  Lifestyle  .  Physical activity:    Days per week: Not on file    Minutes per session: Not on file  . Stress: Not on file  Relationships  . Social connections:    Talks on phone: Not on file    Gets together: Not on file    Attends religious service: Not on file    Active member of club or organization: Not on file    Attends meetings of clubs or organizations: Not on file    Relationship status: Not on file  . Intimate partner violence:    Fear of current or ex partner: Not on file    Emotionally abused: Not on file    Physically abused: Not on file    Forced sexual activity: Not on file  Other Topics Concern  . Not on file  Social History Narrative  . Not on file    Family History  Problem Relation Age of Onset  . Heart disease Mother   . COPD Mother   . Colon polyps Mother   . Prostate cancer Father   . Colon polyps Father   . Diabetes Sister   . Asthma Sister   .  Fibromyalgia Sister   . Colon cancer Neg Hx   . Pancreatic cancer Neg Hx   . Stomach cancer Neg Hx     Current Outpatient Medications  Medication Sig Dispense Refill  . aspirin 81 MG tablet Take 81 mg by mouth daily.      Marland Kitchen atorvastatin (LIPITOR) 10 MG tablet Take 10 mg by mouth daily.    . DULoxetine (CYMBALTA) 30 MG capsule Take 10 mg by mouth daily.     . Empagliflozin-Metformin HCl ER (SYNJARDY XR) 01-999 MG TB24 Take 1 tablet by mouth daily.    Marland Kitchen levothyroxine (SYNTHROID, LEVOTHROID) 200 MCG tablet Take 200 mcg by mouth daily.      . Multiple Vitamin (MULTIVITAMIN) tablet Take 1 tablet by mouth daily.    Marland Kitchen NOVOLOG FLEXPEN 100 UNIT/ML FlexPen Inject 25-35 Units as directed 2 (two) times daily.  2  . ONE TOUCH ULTRA TEST test strip   6  . pantoprazole (PROTONIX) 40 MG tablet Take 40 mg by mouth daily.    Marland Kitchen POLY-IRON 150 150 MG capsule Take 150 mg by mouth daily.  5  . TRESIBA FLEXTOUCH 200 UNIT/ML SOPN Inject 120 Units as directed every morning.    Marland Kitchen UNIFINE PENTIPS 31G X 5 MM MISC   4  .  valsartan-hydrochlorothiazide (DIOVAN HCT) 320-25 MG per tablet Take 1 tablet by mouth daily.     No current facility-administered medications for this visit.     Allergies  Allergen Reactions  . Oxaprozin Other (See Comments)    Causes skin to slough off.  . Ampicillin Itching and Rash    Starts on torso and hands. Spreads up to neck.  . Codeine Itching and Rash    Starts at trunk and hands. Spreads up to the neck.  Elyse Hsu [Shellfish Allergy] Hives and Itching    REVIEW OF SYSTEMS (negative unless checked):   Cardiac:  []  Chest pain or chest pressure? []  Shortness of breath upon activity? []  Shortness of breath when lying flat? []  Irregular heart rhythm?  Vascular:  []  Pain in calf, thigh, or hip brought on by walking? []  Pain in feet at night that wakes you up from your sleep? []  Blood clot in your veins? [x]  Leg swelling?  Pulmonary:  []  Oxygen at home? []  Productive cough? []  Wheezing?  Neurologic:  []  Sudden weakness in arms or legs? []  Sudden numbness in arms or legs? []  Sudden onset of difficult speaking or slurred speech? []  Temporary loss of vision in one eye? []  Problems with dizziness?  Gastrointestinal:  []  Blood in stool? []  Vomited blood?  Genitourinary:  []  Burning when urinating? []  Blood in urine?  Psychiatric:  []  Major depression  Hematologic:  []  Bleeding problems? []  Problems with blood clotting?  Dermatologic:  []  Rashes or ulcers?  Constitutional:  []  Fever or chills?  Ear/Nose/Throat:  []  Change in hearing? []  Nose bleeds? []  Sore throat?  Musculoskeletal:  []  Back pain? [x]  Joint pain? []  Muscle pain?   Physical Examination     Vitals:   11/01/17 1615 11/01/17 1622  BP: (!) 149/76 (!) 145/76  Pulse: 83 83  Resp: 16   Temp: 97.6 F (36.4 C)   TempSrc: Oral   SpO2: 96%   Weight: (!) 365 lb (165.6 kg)   Height: 5\' 7"  (1.702 m)    Body mass index is 57.17 kg/m.  General Alert, O x 3, Obese, NAD  Head  [place]/AT,    Neck Supple, mid-line trachea,  Pulmonary Sym exp, good B air movt, CTA B  Cardiac RRR, Nl S1, S2, no Murmurs, No rubs, No S3,S4  Vascular Vessel Right Left  Radial Palpable Palpable  Aorta Not palpable N/A  Femoral not examined not examined  Popliteal Not palpable Not palpable  ATA Palpable Palpable    Gastro- intestinal soft, non-distended, limited exam due to large body habitus  Musculo- skeletal M/S 5/5 throughout  , Extremities without ischemic changes  , Non-pitting edema present: mild BLE, No visible varicosities , No Lipodermatosclerosis present; stasis pigmentation, induration R>LLE  Neurologic Cranial nerves 2-12 intact ,   Psychiatric Judgement intact, Mood & affect appropriate for pt's clinical situation  Dermatologic See M/S exam for extremity exam, No rashes otherwise noted  Lymphatic  Palpable lymph nodes: None    Non-invasive Vascular Imaging   BLE Venous Insufficiency Duplex (11/01/17):   RLE:   Negative for DVT and SVT,    GSV reflux in its entirety   Negative for SSV reflux,  CFV deep venous reflux  LLE:  Negative for DVT and SVT,    GSV reflux, saphenofemoral junction and proximal calf only   Negative for  SSV reflux,  CFV deep venous reflux   Medical Decision Making   [name] is a 67 y.o. female who presents with: BLE chronic venous insufficiency (C4)   This patient was seen in conjunction with Dr. Donnetta Hutching  Patient was encouraged to wear thigh high compression regularly and was provided information to purchase compression stockings  Also encouraged elevation of BLE when possible during the day  NSAIDs for any discomfort associated with venous insufficiency  The patient will follow up in 3 months with either Dr. [place] Alar or Dr. Scot Dock for evaluation and consideration of R GSV ablation    Dagoberto Ligas PA-C [facility] Office: [phone]  11/01/2017, 5:06 PM

## 2017-11-01 NOTE — Assessment & Plan Note (Signed)
She continues to benefit from CPAP with better sleep.  Download confirms excellent compliance and control. Plan-continue CPAP auto 10-20

## 2017-11-03 DIAGNOSIS — T8579XA Infection and inflammatory reaction due to other internal prosthetic devices, implants and grafts, initial encounter: Secondary | ICD-10-CM | POA: Diagnosis not present

## 2017-11-03 DIAGNOSIS — A4901 Methicillin susceptible Staphylococcus aureus infection, unspecified site: Secondary | ICD-10-CM | POA: Diagnosis not present

## 2017-11-03 MED FILL — CEPHALEXIN 500 MG CAPSULE: 500 | 28 days supply | Qty: 84 | Fill #0

## 2017-11-07 MED FILL — VIT D2 1.25 MG (50,000 UNIT: 1.25 MG | 28 days supply | Qty: 4 | Fill #2

## 2017-11-07 MED FILL — NOVOLOG FLEXPEN SYRINGE: 100 | 30 days supply | Qty: 33 | Fill #1

## 2017-11-10 ENCOUNTER — Ambulatory Visit: Payer: Medicare Other | Admitting: Internal Medicine

## 2017-11-14 MED FILL — DULoxetine HCL 30 MG CPEP: 30 | 30 days supply | Qty: 30 | Fill #4

## 2017-11-21 MED FILL — POLY-IRON 150 MG CAPSULE: 150 | 30 days supply | Qty: 30 | Fill #2

## 2017-11-22 MED FILL — PREDNISOLONE AC 1% EYE DROP: 1 | 50 days supply | Qty: 10 | Fill #0

## 2017-11-28 MED FILL — SYNJARDY XR 10-1000 MG TB24: 10-1000 | 30 days supply | Qty: 30 | Fill #0

## 2017-12-12 MED FILL — DULoxetine HCL 30 MG CPEP: 30 | 30 days supply | Qty: 30 | Fill #5

## 2017-12-12 MED FILL — ATORVASTATIN 10 MG TABLET: 10 | 90 days supply | Qty: 90 | Fill #2

## 2017-12-15 MED FILL — NOVOLOG FLEXPEN SYRINGE: 100 | 30 days supply | Qty: 33 | Fill #2

## 2017-12-15 MED FILL — VIT D2 1.25 MG (50,000 UNIT: 1.25 MG | 28 days supply | Qty: 4 | Fill #3

## 2017-12-15 MED FILL — TRESIBA FLEXTOUCH 200 UNITS: 200 | 15 days supply | Qty: 9 | Fill #0

## 2017-12-19 MED FILL — POLY-IRON 150 MG CAPSULE: 150 | 30 days supply | Qty: 30 | Fill #3

## 2017-12-22 MED FILL — SYNJARDY XR 10-1000 MG TB24: 10-1000 | 30 days supply | Qty: 30 | Fill #1

## 2017-12-27 MED FILL — TRESIBA FLEXTOUCH 200 UNITS: 200 | 15 days supply | Qty: 9 | Fill #1

## 2017-12-30 MED FILL — UNIFINE PENTIPS 8MM 31G: 31G X 8 MM | 90 days supply | Qty: 300 | Fill #0

## 2018-01-09 MED FILL — LEVOTHYROXINE 200 MCG TAB: 200 | 90 days supply | Qty: 90 | Fill #0

## 2018-01-09 MED FILL — DULoxetine HCL 30 MG CPEP: 30 | 30 days supply | Qty: 30 | Fill #6

## 2018-01-17 MED FILL — VIT D2 1.25 MG (50,000 UNIT: 1.25 MG | 28 days supply | Qty: 4 | Fill #4

## 2018-01-17 MED FILL — POLY-IRON 150 MG CAPSULE: 150 | 30 days supply | Qty: 30 | Fill #4

## 2018-01-18 MED FILL — NOVOLOG FLEXPEN SYRINGE: 100 | 30 days supply | Qty: 33 | Fill #3

## 2018-01-18 MED FILL — TRESIBA FLEXTOUCH 200 UNITS: 200 | 15 days supply | Qty: 9 | Fill #2

## 2018-01-20 MED FILL — SYNJARDY XR 10-1000 MG TB24: 10-1000 | 30 days supply | Qty: 30 | Fill #2

## 2018-01-20 MED FILL — CEPHALEXIN 500 MG CAPSULE: 500 | 28 days supply | Qty: 84 | Fill #1

## 2018-01-24 MED FILL — VALSARTAN-HCTZ 320-25 MG TA: 320-25 | 90 days supply | Qty: 90 | Fill #0

## 2018-02-01 ENCOUNTER — Ambulatory Visit: Payer: Medicare Other | Admitting: Vascular Surgery

## 2018-02-08 ENCOUNTER — Ambulatory Visit: Payer: BLUE CROSS/BLUE SHIELD | Admitting: Vascular Surgery

## 2018-02-09 MED FILL — VIT D2 1.25 MG (50,000 UNIT: 1.25 MG | 28 days supply | Qty: 4 | Fill #5

## 2018-02-09 MED FILL — POLY-IRON 150 MG CAPSULE: 150 | 30 days supply | Qty: 30 | Fill #5

## 2018-02-09 MED FILL — DULoxetine HCL 30 MG CPEP: 30 | 30 days supply | Qty: 30 | Fill #0

## 2018-02-09 MED FILL — TRESIBA FLEXTOUCH 200 UNITS: 200 | 25 days supply | Qty: 15 | Fill #0

## 2018-02-13 MED FILL — NOVOLOG FLEXPEN SYRINGE: 100 | 30 days supply | Qty: 33 | Fill #4

## 2018-02-14 DIAGNOSIS — N183 Chronic kidney disease, stage 3 (moderate): Secondary | ICD-10-CM | POA: Diagnosis not present

## 2018-02-14 DIAGNOSIS — G4730 Sleep apnea, unspecified: Secondary | ICD-10-CM | POA: Diagnosis not present

## 2018-02-14 DIAGNOSIS — I10 Essential (primary) hypertension: Secondary | ICD-10-CM | POA: Diagnosis not present

## 2018-02-14 DIAGNOSIS — E118 Type 2 diabetes mellitus with unspecified complications: Secondary | ICD-10-CM | POA: Diagnosis not present

## 2018-02-14 DIAGNOSIS — E7849 Other hyperlipidemia: Secondary | ICD-10-CM | POA: Diagnosis not present

## 2018-02-14 DIAGNOSIS — Z794 Long term (current) use of insulin: Secondary | ICD-10-CM | POA: Diagnosis not present

## 2018-02-14 DIAGNOSIS — E1149 Type 2 diabetes mellitus with other diabetic neurological complication: Secondary | ICD-10-CM | POA: Diagnosis not present

## 2018-02-14 DIAGNOSIS — E1142 Type 2 diabetes mellitus with diabetic polyneuropathy: Secondary | ICD-10-CM | POA: Diagnosis not present

## 2018-02-14 DIAGNOSIS — Z23 Encounter for immunization: Secondary | ICD-10-CM | POA: Diagnosis not present

## 2018-02-14 DIAGNOSIS — I8390 Asymptomatic varicose veins of unspecified lower extremity: Secondary | ICD-10-CM | POA: Diagnosis not present

## 2018-02-14 DIAGNOSIS — E668 Other obesity: Secondary | ICD-10-CM | POA: Diagnosis not present

## 2018-02-16 ENCOUNTER — Other Ambulatory Visit: Payer: Self-pay

## 2018-02-16 ENCOUNTER — Encounter: Payer: Self-pay | Admitting: Vascular Surgery

## 2018-02-16 ENCOUNTER — Ambulatory Visit: Payer: BLUE CROSS/BLUE SHIELD | Admitting: Vascular Surgery

## 2018-02-16 ENCOUNTER — Ambulatory Visit (INDEPENDENT_AMBULATORY_CARE_PROVIDER_SITE_OTHER): Payer: Medicare Other | Admitting: Vascular Surgery

## 2018-02-16 VITALS — BP 156/69 | HR 79 | Temp 98.8°F | Resp 16 | Wt 351.0 lb

## 2018-02-16 DIAGNOSIS — I872 Venous insufficiency (chronic) (peripheral): Secondary | ICD-10-CM | POA: Diagnosis not present

## 2018-02-16 NOTE — Progress Notes (Signed)
Patient name: [name] MRN: [id number] DOB: Dec 07, 1950 Sex: female  REASON FOR VISIT:   4-month follow-up visit.  HPI:   [name] is a pleasant 67 y.o. female who was seen by the 65 assistant on 11/01/2017 with edema and pain of both lower extremities.  The patient also had some discoloration in both lower extremities more significantly on the right side.  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  She is on her feet or sitting throughout most of the day.  She had no previous history of DVT or phlebitis.   Patient had a reflux study on that day.  On the right side there was no evidence of DVT.  There was reflux in the right great saphenous vein.  There was reflux in the deep system involving the common femoral vein.  On the left side there is no DVT or phlebitis.  There was reflux in the saphenofemoral junction on the left and also in the proximal calf only.  There was reflux in the deep system of the common femoral vein.  The patient had CEAP C-4 venous disease.  She was encouraged to wear thigh-high compression stockings with a gradient of 20 to 30 mmHg to elevate her legs and take ibuprofen as needed for pain.  She comes in for 23-month follow-up visit.  Since she was seen last, she continues to have significant aching and heaviness in both legs but especially on the right.  She also describes itching.  She has been wearing her 20 to 30 mmHg grade compression stockings which helps some but she is continuing to have symptoms.  She has been elevating her legs and takes ibuprofen as needed for pain.  I do not get any history of claudication or rest pain.  Past Medical History:  Diagnosis Date  . Diabetes mellitus   . Hyperlipidemia   . Hypertension   . Joint pain   . Obesity   . Sinus complaint   . Thyroid cancer ([place])   . Thyroid disease   . Uterine cancer ([place])   . Wears glasses     Family History  Problem Relation Age of Onset  . Heart  disease Mother   . COPD Mother   . Colon polyps Mother   . Prostate cancer Father   . Colon polyps Father   . Diabetes Sister   . Asthma Sister   . Fibromyalgia Sister   . Colon cancer Neg Hx   . Pancreatic cancer Neg Hx   . Stomach cancer Neg Hx     SOCIAL HISTORY: Social History   Tobacco Use  . Smoking status: Never Smoker  . Smokeless tobacco: Never Used  Substance Use Topics  . Alcohol use: No    Allergies  Allergen Reactions  . Oxaprozin Other (See Comments)    Causes skin to slough off.  . Ampicillin Itching and Rash    Starts on torso and hands. Spreads up to neck.  . Codeine Itching and Rash    Starts at trunk and hands. Spreads up to the neck.  Elyse Hsu [Shellfish Allergy] Hives and Itching    Current Outpatient Medications  Medication Sig Dispense Refill  . aspirin 81 MG tablet Take 81 mg by mouth daily.      Marland Kitchen atorvastatin (LIPITOR) 10 MG tablet Take 10 mg by mouth daily.    . cephALEXin (KEFLEX) 500 MG capsule Take 500 mg by mouth 3 (three) times daily.    Marland Kitchen  DULoxetine (CYMBALTA) 30 MG capsule Take 10 mg by mouth daily.     . Empagliflozin-Metformin HCl ER (SYNJARDY XR) 01-999 MG TB24 Take 1 tablet by mouth daily.    Marland Kitchen levothyroxine (SYNTHROID, LEVOTHROID) 200 MCG tablet Take 200 mcg by mouth daily.      . Multiple Vitamin (MULTIVITAMIN) tablet Take 1 tablet by mouth daily.    Marland Kitchen NOVOLOG FLEXPEN 100 UNIT/ML FlexPen Inject 25-35 Units as directed 2 (two) times daily.  2  . ONE TOUCH ULTRA TEST test strip   6  . pantoprazole (PROTONIX) 40 MG tablet Take 40 mg by mouth daily.    Marland Kitchen POLY-IRON 150 150 MG capsule Take 150 mg by mouth daily.  5  . TRESIBA FLEXTOUCH 200 UNIT/ML SOPN Inject 100 Units as directed every morning.     Marland Kitchen UNIFINE PENTIPS 31G X 5 MM MISC   4  . valsartan-hydrochlorothiazide (DIOVAN HCT) 320-25 MG per tablet Take 1 tablet by mouth daily.     No current facility-administered medications for this visit.     REVIEW OF SYSTEMS:  [X]   denotes positive finding, [ ]  denotes negative finding Cardiac  Comments:  Chest pain or chest pressure:    Shortness of breath upon exertion:    Short of breath when lying flat:    Irregular heart rhythm:        Vascular    Pain in calf, thigh, or hip brought on by ambulation:    Pain in feet at night that wakes you up from your sleep:     Blood clot in your veins:    Leg swelling:  x       Pulmonary    Oxygen at home:    Productive cough:     Wheezing:         Neurologic    Sudden weakness in arms or legs:     Sudden numbness in arms or legs:     Sudden onset of difficulty speaking or slurred speech:    Temporary loss of vision in one eye:     Problems with dizziness:         Gastrointestinal    Blood in stool:     Vomited blood:         Genitourinary    Burning when urinating:     Blood in urine:        Psychiatric    Major depression:         Hematologic    Bleeding problems:    Problems with blood clotting too easily:        Skin    Rashes or ulcers:        Constitutional    Fever or chills:     PHYSICAL EXAM:   Vitals:   02/16/18 0934  BP: (!) 156/69  Pulse: 79  Resp: 16  Temp: 98.8 F (37.1 C)  TempSrc: Oral  SpO2: (!) 16%  Weight: (!) 351 lb (159.2 kg)  Body mass index is 54.97 kg/m.   GENERAL: The patient is a well-nourished female, in no acute distress. The vital signs are documented above. CARDIAC: There is a regular rate and rhythm.  VASCULAR: I do not detect carotid bruits. She has diminished but palpable dorsalis pedis pulses.  Both feet are warm and well-perfused. She has hyperpigmentation of both lower extremities more significantly on the right side.  She does have some varicose veins in the right thigh. I did look at her right great saphenous vein  with the SonoSite and the vein is enlarged all the way down to the proximal calf.  We could likely cannulate the vein in the proximal calf. PULMONARY: There is good air exchange  bilaterally without wheezing or rales. ABDOMEN: Soft and non-tender with normal pitched bowel sounds.  MUSCULOSKELETAL: There are no major deformities or cyanosis. NEUROLOGIC: No focal weakness or paresthesias are detected. SKIN: There are no ulcers or rashes noted. PSYCHIATRIC: The patient has a normal affect.  DATA:    VENOUS DUPLEX: I have reviewed her previous venous duplex scan which showed reflux throughout the entire right great saphenous vein and this was significantly dilated.  MEDICAL ISSUES:   CHRONIC VENOUS INSUFFICIENCY: This patient continues to have significant pain in both lower extremities but especially on the right.  She has chronic venous insufficiency bilaterally.  She has CEAP C-4 disease.  She has gross reflux about the entire right great saphenous vein which is significantly dilated.  She is failed conservative treatment including compression stockings, leg elevation, and ibuprofen.  I think she is a reasonable candidate for endovenous laser ablation of the right great saphenous vein. I have discussed the indications for endovenous laser ablation of the right GSV, that is to lower the pressure in the veins and potentially help relieve the symptoms from venous hypertension. I have also discussed alternative options including conservative treatment with leg elevation, compression therapy, exercise, avoiding prolonged sitting and standing, and weight management. I have discussed the potential complications of the procedure, including, but not limited to: bleeding, bruising, leg swelling, nerve injury, skin burns, significant pain from phlebitis, deep venous thrombosis, or failure of the vein to close.  I have also explained that venous insufficiency is a chronic disease, and that the patient is at risk for recurrent varicose veins in the future.  All of the patient's questions were encouraged and answered.  The patient would like to proceed with this as soon as  possible.  Deitra Mayo [facility] [phone]

## 2018-02-20 ENCOUNTER — Other Ambulatory Visit: Payer: Self-pay | Admitting: *Deleted

## 2018-02-20 DIAGNOSIS — I872 Venous insufficiency (chronic) (peripheral): Secondary | ICD-10-CM

## 2018-03-01 MED FILL — TRESIBA FLEXTOUCH 200 UNITS: 200 | 25 days supply | Qty: 15 | Fill #1

## 2018-03-01 MED FILL — VIT D2 1.25 MG (50,000 UNIT: 1.25 MG | 28 days supply | Qty: 4 | Fill #6

## 2018-03-01 MED FILL — SYNJARDY XR 10-1000 MG TB24: 10-1000 | 30 days supply | Qty: 30 | Fill #3

## 2018-03-05 MED FILL — DULoxetine HCL 30 MG CPEP: 30 | 30 days supply | Qty: 30 | Fill #1

## 2018-03-10 MED FILL — ATORVASTATIN 10 MG TABLET: 10 | 90 days supply | Qty: 90 | Fill #0

## 2018-03-10 MED FILL — NOVOLOG FLEXPEN SYRINGE: 100 | 30 days supply | Qty: 33 | Fill #5

## 2018-03-23 ENCOUNTER — Encounter: Payer: Self-pay | Admitting: Vascular Surgery

## 2018-03-23 ENCOUNTER — Ambulatory Visit (INDEPENDENT_AMBULATORY_CARE_PROVIDER_SITE_OTHER): Payer: Medicare Other | Admitting: Vascular Surgery

## 2018-03-23 VITALS — BP 152/80 | HR 85 | Temp 98.0°F | Resp 18 | Ht 67.5 in | Wt 360.0 lb

## 2018-03-23 DIAGNOSIS — I83811 Varicose veins of right lower extremities with pain: Secondary | ICD-10-CM

## 2018-03-23 NOTE — Progress Notes (Signed)
     Laser Ablation Procedure    Date: 03/23/2018   JETAIME PINNIX DOB:09-07-1950  Consent signed: Yes    Surgeon:  Dr. Sherren Mocha Early  Procedure: Laser Ablation: right Greater Saphenous Vein  BP (!) 152/80   Pulse 85   Temp 98 F (36.7 C)   Resp 18   Ht 5' 7.5" (1.715 m)   Wt (!) 360 lb (163.3 kg)   SpO2 99%   BMI 55.55 kg/m   Tumescent Anesthesia: 450 cc 0.9% NaCl with 50 cc Lidocaine HCL with 1% Epi and 15 cc 8.4% NaHCO3  Local Anesthesia: 9 cc Lidocaine HCL and NaHCO3 (ratio 2:1)  15 watts continuous mode        Total energy: 3198   Total time: 3:29    Patient tolerated procedure well  Notes:   Description of Procedure:  After marking the course of the secondary varicosities, the patient was placed on the operating table in the supine position, and the right leg was prepped and draped in sterile fashion.   Local anesthetic was administered and under ultrasound guidance the saphenous vein was accessed with a micro needle and guide wire; then the mirco puncture sheath was placed.  A guide wire was inserted saphenofemoral junction , followed by a 5 french sheath.  The position of the sheath and then the laser fiber below the junction was confirmed using the ultrasound.  Tumescent anesthesia was administered along the course of the saphenous vein using ultrasound guidance. The patient was placed in Trendelenburg position and protective laser glasses were placed on patient and staff, and the laser was fired at 15 watts continuous mode advancing 1-78mm/second for a total of 3198 joules.     Steri strips were applied to the stab wounds and ABD pads and thigh high compression stockings were applied.  Ace wrap bandages were applied over the phlebectomy sites and at the top of the saphenofemoral junction. Blood loss was less than 15 cc.  The patient ambulated out of the operating room having tolerated the procedure well.

## 2018-03-23 NOTE — Progress Notes (Signed)
Patient name: [name] MRN: [id number] DOB: 12-Apr-1950 Sex: female  REASON FOR VISIT:   For endovenous laser ablation of the right great saphenous vein.  HPI:   [name] is a pleasant 67 y.o. female who I last saw on 02/16/2018.  The patient has had edema and pain in both lower extremities.  The patient failed conservative treatment and had significant reflux in the right great saphenous vein with C-4 disease.  Current Outpatient Medications  Medication Sig Dispense Refill  . aspirin 81 MG tablet Take 81 mg by mouth daily.      Marland Kitchen atorvastatin (LIPITOR) 10 MG tablet Take 10 mg by mouth daily.    . cephALEXin (KEFLEX) 500 MG capsule Take 500 mg by mouth 3 (three) times daily.    . DULoxetine (CYMBALTA) 30 MG capsule Take 10 mg by mouth daily.     . Empagliflozin-Metformin HCl ER (SYNJARDY XR) 01-999 MG TB24 Take 1 tablet by mouth daily.    Marland Kitchen levothyroxine (SYNTHROID, LEVOTHROID) 200 MCG tablet Take 200 mcg by mouth daily.      . Multiple Vitamin (MULTIVITAMIN) tablet Take 1 tablet by mouth daily.    Marland Kitchen NOVOLOG FLEXPEN 100 UNIT/ML FlexPen Inject 25-35 Units as directed 2 (two) times daily.  2  . ONE TOUCH ULTRA TEST test strip   6  . pantoprazole (PROTONIX) 40 MG tablet Take 40 mg by mouth daily.    Marland Kitchen POLY-IRON 150 150 MG capsule Take 150 mg by mouth daily.  5  . TRESIBA FLEXTOUCH 200 UNIT/ML SOPN Inject 100 Units as directed every morning.     Marland Kitchen UNIFINE PENTIPS 31G X 5 MM MISC   4  . valsartan-hydrochlorothiazide (DIOVAN HCT) 320-25 MG per tablet Take 1 tablet by mouth daily.     No current facility-administered medications for this visit.     REVIEW OF SYSTEMS:  [X]  denotes positive finding, [ ]  denotes negative finding Vascular    Leg swelling    Cardiac    Chest pain or chest pressure:    Shortness of breath upon exertion:    Short of breath when lying flat:    Irregular heart rhythm:    Constitutional    Fever or chills:     PHYSICAL EXAM:   Vitals:   03/23/18 0821  BP: (!) 152/80  Pulse: 85  Resp: 18  Temp: 98 F (36.7 C)  SpO2: 99%  Weight: (!) 360 lb (163.3 kg)  Height: 5' 7.5" (1.715 m)    GENERAL: The patient is a well-nourished female, in no acute distress. The vital signs are documented above.  DATA:   No new data  MEDICAL ISSUES:   ENDOVENOUS LASER ABLATION RIGHT GREAT SAPHENOUS VEIN: The patient was taken to the exam room and I examined the right great saphenous vein with the SonoSite.  It look like I could access this just below the knee.  The right leg was prepped and draped in usual sterile fashion.  Under ultrasound guidance I cannulated the right great saphenous vein just below the knee with a micropuncture needle and a micropuncture sheath was introduced over the wire.  I then advanced the J-wire up to the saphenofemoral junction under ultrasound guidance.  The sheath was then advanced over the wire and the dilator was removed.  I carefully positioned the wire below the saphenofemoral junction.  This was about 2-1/2 cm.  The laser fiber was then measured and positioned below the saphenofemoral junction.  Next the skin  was anesthetized with 1% lidocaine.  Tumescent anesthesia was administered the entire length of the vein.  The patient was then placed in Trendelenburg.  Laser ablation was performed of the right great saphenous vein in approximately a pullback rate of 1 to 2 mm/s.  A total of 3198 J were used to the proximal calf.  The vein was a large.  Patient tolerated the procedure well sterile dressing was applied the patient will return for follow-up duplex in 1 to 2 weeks.  Deitra Mayo [facility] [phone]

## 2018-03-24 ENCOUNTER — Encounter: Payer: Self-pay | Admitting: Vascular Surgery

## 2018-03-27 MED FILL — SYNJARDY XR 10-1000 MG TB24: 10-1000 | 30 days supply | Qty: 30 | Fill #4

## 2018-03-27 MED FILL — CEPHALEXIN 500 MG CAPSULE: 500 | 28 days supply | Qty: 84 | Fill #2

## 2018-03-27 MED FILL — POLY-IRON 150 MG CAPSULE: 150 | 30 days supply | Qty: 30 | Fill #0

## 2018-04-03 MED FILL — TRESIBA FLEXTOUCH 200 UNITS: 200 | 25 days supply | Qty: 15 | Fill #0

## 2018-04-06 ENCOUNTER — Encounter: Payer: Self-pay | Admitting: Vascular Surgery

## 2018-04-06 ENCOUNTER — Ambulatory Visit: Payer: Medicare Other | Admitting: Vascular Surgery

## 2018-04-06 ENCOUNTER — Encounter (HOSPITAL_COMMUNITY): Payer: Medicare Other

## 2018-04-06 ENCOUNTER — Ambulatory Visit (INDEPENDENT_AMBULATORY_CARE_PROVIDER_SITE_OTHER): Payer: Medicare Other | Admitting: Vascular Surgery

## 2018-04-06 ENCOUNTER — Other Ambulatory Visit: Payer: Self-pay

## 2018-04-06 ENCOUNTER — Ambulatory Visit (HOSPITAL_COMMUNITY)
Admission: RE | Admit: 2018-04-06 | Discharge: 2018-04-06 | Disposition: A | Discharge status: Home / Self Care | Payer: Medicare Other | Source: Ambulatory Visit | Attending: Vascular Surgery | Admitting: Vascular Surgery

## 2018-04-06 VITALS — BP 157/81 | HR 82 | Resp 18 | Ht 67.5 in | Wt 360.0 lb

## 2018-04-06 DIAGNOSIS — Z48812 Encounter for surgical aftercare following surgery on the circulatory system: Secondary | ICD-10-CM

## 2018-04-06 DIAGNOSIS — I872 Venous insufficiency (chronic) (peripheral): Secondary | ICD-10-CM | POA: Diagnosis not present

## 2018-04-06 DIAGNOSIS — I83811 Varicose veins of right lower extremities with pain: Secondary | ICD-10-CM

## 2018-04-06 NOTE — Progress Notes (Signed)
Patient name: [name] MRN: [id number] DOB: [birth year] Sex: female  REASON FOR VISIT:   Follow-up after endovenous laser ablation of the right great saphenous vein  HPI:   [name] is a pleasant 68 y.o. female who underwent endovenous laser ablation of the right great saphenous vein on 03/23/2018.  She comes in for a one-week follow-up visit.  She has no specific complaints.  She is had no significant swelling or pain.  She has been wearing her compression stockings.  She is been elevating her legs.  She denies any chest pain or shortness of breath.  Current Outpatient Medications  Medication Sig Dispense Refill  . aspirin 81 MG tablet Take 81 mg by mouth daily.      Marland Kitchen atorvastatin (LIPITOR) 10 MG tablet Take 10 mg by mouth daily.    . cephALEXin (KEFLEX) 500 MG capsule Take 500 mg by mouth 3 (three) times daily.    . DULoxetine (CYMBALTA) 30 MG capsule Take 10 mg by mouth daily.     . Empagliflozin-Metformin HCl ER (SYNJARDY XR) 01-999 MG TB24 Take 1 tablet by mouth daily.    Marland Kitchen levothyroxine (SYNTHROID, LEVOTHROID) 200 MCG tablet Take 200 mcg by mouth daily.      . Multiple Vitamin (MULTIVITAMIN) tablet Take 1 tablet by mouth daily.    Marland Kitchen NOVOLOG FLEXPEN 100 UNIT/ML FlexPen Inject 25-35 Units as directed 2 (two) times daily.  2  . ONE TOUCH ULTRA TEST test strip   6  . pantoprazole (PROTONIX) 40 MG tablet Take 40 mg by mouth daily.    Marland Kitchen POLY-IRON 150 150 MG capsule Take 150 mg by mouth daily.  5  . TRESIBA FLEXTOUCH 200 UNIT/ML SOPN Inject 100 Units as directed every morning.     Marland Kitchen UNIFINE PENTIPS 31G X 5 MM MISC   4  . valsartan-hydrochlorothiazide (DIOVAN HCT) 320-25 MG per tablet Take 1 tablet by mouth daily.     No current facility-administered medications for this visit.     REVIEW OF SYSTEMS:  [X]  denotes positive finding, [ ]  denotes negative finding Vascular    Leg swelling    Cardiac    Chest pain or chest pressure:    Shortness of breath upon exertion:      Short of breath when lying flat:    Irregular heart rhythm:    Constitutional    Fever or chills:     PHYSICAL EXAM:   Vitals:   04/06/18 1339  BP: (!) 157/81  Pulse: 82  Resp: 18  SpO2: 95%  Weight: (!) 360 lb (163.3 kg)  Height: 5' 7.5" (1.715 m)    GENERAL: The patient is a well-nourished female, in no acute distress. The vital signs are documented above. CARDIOVASCULAR: There is a regular rate and rhythm. PULMONARY: There is good air exchange bilaterally without wheezing or rales. She has no significant bruising in the right thigh.  She has hyperpigmentation in the right leg which is chronic.  DATA:   VENOUS DUPLEX: I have independently interpreted her venous duplex scan today.  The vein has been successfully closed up to within 3.07 cm of the saphenofemoral junction.  There is no evidence of DVT.  MEDICAL ISSUES:   STATUS POST LASER ABLATION RIGHT GREAT SAPHENOUS VEIN: Patient has undergone successful endovenous laser ablation of the right great saphenous vein from the proximal calf to the groin.  The vein is successfully closed.  On the left side she does not have significant reflux in the  saphenous vein in the thigh.  I explained that if her symptoms progressed on the left we could repeat her study to see if this has changed.  However I will otherwise see her back as needed.  I encouraged her to continue to wear her compression stockings and to elevate her legs daily.  We have encouraged her to avoid prolonged sitting and standing and to exercise as much as possible, specifically water aerobics and walking.  [name] [facility] [phone]

## 2018-04-24 MED FILL — SYNJARDY XR 10-1000 MG TB24: 10-1000 | 30 days supply | Qty: 30 | Fill #5

## 2018-04-24 MED FILL — VALSARTAN-HCTZ 320-25 MG TA: 320-25 | 90 days supply | Qty: 90 | Fill #1

## 2018-04-24 MED FILL — POLY-IRON 150 MG CAPSULE: 150 | 30 days supply | Qty: 30 | Fill #1

## 2018-04-25 MED FILL — CEPHALEXIN 500 MG CAPSULE: 500 | 28 days supply | Qty: 84 | Fill #3

## 2018-04-25 MED FILL — UNIFINE PENTIPS 8MM 31G: 31G X 8 MM | 90 days supply | Qty: 300 | Fill #1

## 2018-04-25 MED FILL — NOVOLOG FLEXPEN SYRINGE: 100 | 30 days supply | Qty: 33 | Fill #6

## 2018-04-25 MED FILL — TRESIBA FLEXTOUCH 200 UNITS: 200 | 25 days supply | Qty: 15 | Fill #1

## 2018-04-26 MED FILL — LEVOTHYROXINE 200 MCG TAB: 200 | 90 days supply | Qty: 90 | Fill #1

## 2018-04-27 MED FILL — DULoxetine HCL 30 MG CPEP: 30 | 30 days supply | Qty: 30 | Fill #2

## 2018-05-08 MED FILL — VIT D2 1.25 MG (50,000 UNIT: 1.25 MG | 28 days supply | Qty: 4 | Fill #7

## 2018-05-22 MED FILL — DULoxetine HCL 30 MG CPEP: 30 | 30 days supply | Qty: 30 | Fill #3

## 2018-05-22 MED FILL — POLY-IRON 150 MG CAPSULE: 150 | 30 days supply | Qty: 30 | Fill #2

## 2018-05-24 MED FILL — TRESIBA FLEXTOUCH 200 UNITS: 200 | 30 days supply | Qty: 18 | Fill #0

## 2018-05-29 MED FILL — SYNJARDY XR 10-1000 MG TB24: 10-1000 | 30 days supply | Qty: 30 | Fill #0 | Status: TO

## 2018-05-30 MED FILL — ATORVASTATIN 10 MG TABLET: 10 | 90 days supply | Qty: 90 | Fill #1

## 2018-06-15 MED FILL — VIT D2 1.25 MG (50,000 UNIT: 1.25 MG | 28 days supply | Qty: 4 | Fill #7 | Status: TO

## 2018-06-15 MED FILL — DULoxetine HCL 30 MG CPEP: 30 | 30 days supply | Qty: 30 | Fill #4 | Status: TO

## 2018-06-15 MED FILL — POLY-IRON 150 MG CAPSULE: 150 | 30 days supply | Qty: 30 | Fill #3 | Status: TO

## 2018-06-19 MED FILL — TRESIBA FLEXTOUCH 200 UNITS: 200 | 30 days supply | Qty: 18 | Fill #1 | Status: TO

## 2018-06-28 MED FILL — SYNJARDY XR 10-1000 MG TB24: 10-1000 | 30 days supply | Qty: 30 | Fill #0

## 2018-07-18 MED FILL — FERREX 150 CAPSULE: 150 | 30 days supply | Qty: 30 | Fill #0

## 2018-07-18 MED FILL — DULoxetine HCL 30 MG CPEP: 30 | 30 days supply | Qty: 30 | Fill #0

## 2018-07-18 MED FILL — TRESIBA FLEXTOUCH 200 UNITS: 200 | 30 days supply | Qty: 18 | Fill #0

## 2018-07-18 MED FILL — CEPHALEXIN 500 MG CAPSULE: 500 | 28 days supply | Qty: 84 | Fill #0

## 2018-07-18 MED FILL — VIT D2 1.25 MG (50,000 UNIT: 1.25 MG | 28 days supply | Qty: 4 | Fill #0

## 2018-07-18 MED FILL — UNIFINE PENTIPS 8MM 31G: 31G X 8 MM | 90 days supply | Qty: 300 | Fill #0

## 2018-07-19 MED FILL — VALSARTAN-HCTZ 320-25 MG TA: 320-25 | 30 days supply | Qty: 30 | Fill #0

## 2018-07-19 MED FILL — LEVOTHYROXINE 200 MCG TAB: 200 | 90 days supply | Qty: 90 | Fill #0

## 2018-07-31 MED FILL — SYNJARDY XR 10-1000 MG TB24: 10-1000 | 30 days supply | Qty: 30 | Fill #1 | Status: TO

## 2018-08-01 DIAGNOSIS — T148XXD Other injury of unspecified body region, subsequent encounter: Secondary | ICD-10-CM | POA: Diagnosis not present

## 2018-08-01 DIAGNOSIS — N183 Chronic kidney disease, stage 3 (moderate): Secondary | ICD-10-CM | POA: Diagnosis not present

## 2018-08-01 DIAGNOSIS — F329 Major depressive disorder, single episode, unspecified: Secondary | ICD-10-CM | POA: Diagnosis not present

## 2018-08-01 DIAGNOSIS — E785 Hyperlipidemia, unspecified: Secondary | ICD-10-CM | POA: Diagnosis not present

## 2018-08-01 DIAGNOSIS — I8390 Asymptomatic varicose veins of unspecified lower extremity: Secondary | ICD-10-CM | POA: Diagnosis not present

## 2018-08-01 DIAGNOSIS — C541 Malignant neoplasm of endometrium: Secondary | ICD-10-CM | POA: Diagnosis not present

## 2018-08-01 DIAGNOSIS — E559 Vitamin D deficiency, unspecified: Secondary | ICD-10-CM | POA: Diagnosis not present

## 2018-08-01 DIAGNOSIS — I129 Hypertensive chronic kidney disease with stage 1 through stage 4 chronic kidney disease, or unspecified chronic kidney disease: Secondary | ICD-10-CM | POA: Diagnosis not present

## 2018-08-01 DIAGNOSIS — G4730 Sleep apnea, unspecified: Secondary | ICD-10-CM | POA: Diagnosis not present

## 2018-08-01 DIAGNOSIS — E1142 Type 2 diabetes mellitus with diabetic polyneuropathy: Secondary | ICD-10-CM | POA: Diagnosis not present

## 2018-08-01 DIAGNOSIS — E1149 Type 2 diabetes mellitus with other diabetic neurological complication: Secondary | ICD-10-CM | POA: Diagnosis not present

## 2018-08-01 MED FILL — DULOXETINE HCL 60 MG CPEP: 60 | 30 days supply | Qty: 30 | Fill #0 | Status: TO

## 2018-08-10 DIAGNOSIS — H5203 Hypermetropia, bilateral: Secondary | ICD-10-CM | POA: Diagnosis not present

## 2018-08-10 DIAGNOSIS — E119 Type 2 diabetes mellitus without complications: Secondary | ICD-10-CM | POA: Diagnosis not present

## 2018-08-10 DIAGNOSIS — H43811 Vitreous degeneration, right eye: Secondary | ICD-10-CM | POA: Diagnosis not present

## 2018-08-10 DIAGNOSIS — H04123 Dry eye syndrome of bilateral lacrimal glands: Secondary | ICD-10-CM | POA: Diagnosis not present

## 2018-08-21 ENCOUNTER — Other Ambulatory Visit: Payer: Self-pay | Admitting: Obstetrics and Gynecology

## 2018-08-21 DIAGNOSIS — Z1231 Encounter for screening mammogram for malignant neoplasm of breast: Secondary | ICD-10-CM

## 2018-08-21 MED FILL — FERREX 150 CAPSULE: 150 | 30 days supply | Qty: 30 | Fill #1

## 2018-08-21 MED FILL — VIT D2 1.25 MG (50,000 UNIT: 1.25 MG | 84 days supply | Qty: 12 | Fill #0

## 2018-08-21 MED FILL — VALSARTAN-HCTZ 320-25 MG TA: 320-25 | 30 days supply | Qty: 30 | Fill #1

## 2018-08-21 MED FILL — TRESIBA FLEXTOUCH 200 UNITS: 200 | 30 days supply | Qty: 18 | Fill #1

## 2018-08-29 MED FILL — DICLOFENAC SODIUM 1% GEL: 1 | 8 days supply | Qty: 100 | Fill #0

## 2018-08-29 MED FILL — SYNJARDY XR 10-1000 MG TB24: 10-1000 | 30 days supply | Qty: 30 | Fill #0

## 2018-08-29 MED FILL — DULoxetine HCL 60 MG CPEP: 60 | 30 days supply | Qty: 30 | Fill #0

## 2018-09-18 MED FILL — POLY-IRON 150 MG CAPSULE: 150 | 30 days supply | Qty: 30 | Fill #0

## 2018-09-18 MED FILL — VALSARTAN-HCTZ 320-25 MG TA: 320-25 | 30 days supply | Qty: 30 | Fill #0

## 2018-09-18 MED FILL — TRESIBA FLEXTOUCH 200 UNITS: 200 | 30 days supply | Qty: 18 | Fill #0

## 2018-09-18 MED FILL — ATORVASTATIN 10 MG TABLET: 10 | 90 days supply | Qty: 90 | Fill #2

## 2018-09-27 MED FILL — DULoxetine HCL 60 MG CPEP: 60 | 30 days supply | Qty: 30 | Fill #1

## 2018-09-27 MED FILL — SYNJARDY XR 10-1000 MG TB24: 10-1000 | 30 days supply | Qty: 30 | Fill #1

## 2018-10-10 ENCOUNTER — Ambulatory Visit
Admission: RE | Admit: 2018-10-10 | Discharge: 2018-10-10 | Disposition: A | Discharge status: Home / Self Care | Payer: BLUE CROSS/BLUE SHIELD | Source: Ambulatory Visit | Attending: Obstetrics and Gynecology | Admitting: Obstetrics and Gynecology

## 2018-10-10 DIAGNOSIS — Z1231 Encounter for screening mammogram for malignant neoplasm of breast: Secondary | ICD-10-CM

## 2018-10-23 MED FILL — UNIFINE PENTIPS 8MM 31G: 31G X 8 MM | 90 days supply | Qty: 300 | Fill #0

## 2018-10-23 MED FILL — POLY-IRON 150 MG CAPSULE: 150 | 30 days supply | Qty: 30 | Fill #1

## 2018-10-23 MED FILL — VALSARTAN-HCTZ 320-25 MG TA: 320-25 | 30 days supply | Qty: 30 | Fill #1

## 2018-10-23 MED FILL — DULoxetine HCL 60 MG CPEP: 60 | 30 days supply | Qty: 30 | Fill #2

## 2018-10-23 MED FILL — TRESIBA FLEXTOUCH 200 UNITS: 200 | 12 days supply | Qty: 18 | Fill #0

## 2018-10-23 MED FILL — NOVOLOG FLEXPEN SYRINGE: 100 | 30 days supply | Qty: 33 | Fill #0

## 2018-10-23 MED FILL — SYNJARDY XR 10-1000 MG TB24: 10-1000 | 30 days supply | Qty: 30 | Fill #2

## 2018-10-23 MED FILL — LEVOTHYROXINE 200 MCG TAB: 200 | 90 days supply | Qty: 90 | Fill #0

## 2018-11-02 ENCOUNTER — Ambulatory Visit: Payer: Medicare Other | Admitting: Internal Medicine

## 2018-11-08 MED FILL — VIT D2 1.25 MG (50,000 UNIT: 1.25 MG | 84 days supply | Qty: 12 | Fill #0

## 2018-11-24 MED FILL — DULoxetine HCL 60 MG CPEP: 60 | 30 days supply | Qty: 30 | Fill #0

## 2018-11-24 MED FILL — POLY-IRON 150 MG CAPSULE: 150 | 30 days supply | Qty: 30 | Fill #2

## 2018-11-24 MED FILL — SYNJARDY XR 10-1000 MG TB24: 10-1000 | 30 days supply | Qty: 30 | Fill #0

## 2018-11-24 MED FILL — VALSARTAN-HCTZ 320-25 MG TA: 320-25 | 30 days supply | Qty: 30 | Fill #2

## 2018-11-24 MED FILL — TRESIBA FLEXTOUCH 200 UNITS: 200 | 30 days supply | Qty: 18 | Fill #1

## 2018-11-24 MED FILL — NOVOLOG FLEXPEN SYRINGE: 100 | 30 days supply | Qty: 33 | Fill #1

## 2018-12-09 DIAGNOSIS — Z20828 Contact with and (suspected) exposure to other viral communicable diseases: Secondary | ICD-10-CM | POA: Diagnosis not present

## 2018-12-21 MED FILL — POLY-IRON 150 MG CAPSULE: 150 | 30 days supply | Qty: 30 | Fill #3

## 2018-12-21 MED FILL — SYNJARDY XR 10-1000 MG TB24: 10-1000 | 30 days supply | Qty: 30 | Fill #1

## 2018-12-21 MED FILL — ATORVASTATIN 10 MG TABLET: 10 | 90 days supply | Qty: 90 | Fill #0

## 2018-12-21 MED FILL — TRESIBA FLEXTOUCH 200 UNITS: 200 | 30 days supply | Qty: 18 | Fill #2

## 2018-12-21 MED FILL — NOVOLOG FLEXPEN SYRINGE: 100 | 30 days supply | Qty: 33 | Fill #2

## 2018-12-21 MED FILL — DULoxetine HCL 60 MG CPEP: 60 | 30 days supply | Qty: 30 | Fill #1

## 2018-12-21 MED FILL — VALSARTAN-HCTZ 320-25 MG TA: 320-25 | 30 days supply | Qty: 30 | Fill #3

## 2019-01-12 DIAGNOSIS — B998 Other infectious disease: Secondary | ICD-10-CM | POA: Diagnosis not present

## 2019-01-12 DIAGNOSIS — I8390 Asymptomatic varicose veins of unspecified lower extremity: Secondary | ICD-10-CM | POA: Diagnosis not present

## 2019-01-12 DIAGNOSIS — I10 Essential (primary) hypertension: Secondary | ICD-10-CM | POA: Diagnosis not present

## 2019-01-12 DIAGNOSIS — Z23 Encounter for immunization: Secondary | ICD-10-CM | POA: Diagnosis not present

## 2019-01-12 DIAGNOSIS — E559 Vitamin D deficiency, unspecified: Secondary | ICD-10-CM | POA: Diagnosis not present

## 2019-01-12 DIAGNOSIS — I129 Hypertensive chronic kidney disease with stage 1 through stage 4 chronic kidney disease, or unspecified chronic kidney disease: Secondary | ICD-10-CM | POA: Diagnosis not present

## 2019-01-12 DIAGNOSIS — E042 Nontoxic multinodular goiter: Secondary | ICD-10-CM | POA: Diagnosis not present

## 2019-01-12 DIAGNOSIS — E1142 Type 2 diabetes mellitus with diabetic polyneuropathy: Secondary | ICD-10-CM | POA: Diagnosis not present

## 2019-01-12 DIAGNOSIS — D649 Anemia, unspecified: Secondary | ICD-10-CM | POA: Diagnosis not present

## 2019-01-12 DIAGNOSIS — N1831 Chronic kidney disease, stage 3a: Secondary | ICD-10-CM | POA: Diagnosis not present

## 2019-01-12 DIAGNOSIS — E1149 Type 2 diabetes mellitus with other diabetic neurological complication: Secondary | ICD-10-CM | POA: Diagnosis not present

## 2019-01-12 DIAGNOSIS — E785 Hyperlipidemia, unspecified: Secondary | ICD-10-CM | POA: Diagnosis not present

## 2019-01-12 DIAGNOSIS — E7849 Other hyperlipidemia: Secondary | ICD-10-CM | POA: Diagnosis not present

## 2019-01-12 DIAGNOSIS — G4730 Sleep apnea, unspecified: Secondary | ICD-10-CM | POA: Diagnosis not present

## 2019-01-12 MED FILL — DOXYCYCLINE HYC 100 MG CAPS: 100 | 7 days supply | Qty: 14 | Fill #0

## 2019-01-22 MED FILL — UNIFINE PENTIPS 8MM 31G: 31G X 8 MM | 90 days supply | Qty: 300 | Fill #0

## 2019-01-22 MED FILL — SYNJARDY XR 10-1000 MG TB24: 10-1000 | 30 days supply | Qty: 30 | Fill #2

## 2019-01-22 MED FILL — VIT D2 1.25 MG (50,000 UNIT: 1.25 MG | 84 days supply | Qty: 12 | Fill #1

## 2019-01-22 MED FILL — TRESIBA FLEXTOUCH 200 UNITS: 200 | 30 days supply | Qty: 18 | Fill #3

## 2019-01-22 MED FILL — VALSARTAN-HCTZ 320-25 MG TA: 320-25 | 30 days supply | Qty: 30 | Fill #4

## 2019-01-22 MED FILL — LEVOTHYROXINE 200 MCG TAB: 200 | 90 days supply | Qty: 90 | Fill #1

## 2019-01-22 MED FILL — POLY-IRON 150 MG CAPSULE: 150 | 30 days supply | Qty: 30 | Fill #4

## 2019-01-22 MED FILL — NOVOLOG FLEXPEN SYRINGE: 100 | 30 days supply | Qty: 33 | Fill #3

## 2019-01-23 DIAGNOSIS — Z6841 Body Mass Index (BMI) 40.0 and over, adult: Secondary | ICD-10-CM | POA: Diagnosis not present

## 2019-01-23 DIAGNOSIS — Z124 Encounter for screening for malignant neoplasm of cervix: Secondary | ICD-10-CM | POA: Diagnosis not present

## 2019-02-05 ENCOUNTER — Encounter: Payer: Self-pay | Admitting: Gastroenterology

## 2019-02-12 ENCOUNTER — Other Ambulatory Visit: Payer: Self-pay

## 2019-02-12 ENCOUNTER — Ambulatory Visit (INDEPENDENT_AMBULATORY_CARE_PROVIDER_SITE_OTHER): Payer: Medicare Other | Admitting: Internal Medicine

## 2019-02-12 ENCOUNTER — Encounter: Payer: Self-pay | Admitting: Internal Medicine

## 2019-02-12 DIAGNOSIS — H65192 Other acute nonsuppurative otitis media, left ear: Secondary | ICD-10-CM | POA: Diagnosis not present

## 2019-02-12 DIAGNOSIS — R0609 Other forms of dyspnea: Secondary | ICD-10-CM

## 2019-02-12 DIAGNOSIS — R0600 Dyspnea, unspecified: Secondary | ICD-10-CM

## 2019-02-12 DIAGNOSIS — G4733 Obstructive sleep apnea (adult) (pediatric): Secondary | ICD-10-CM | POA: Diagnosis not present

## 2019-02-12 DIAGNOSIS — H6690 Otitis media, unspecified, unspecified ear: Secondary | ICD-10-CM | POA: Insufficient documentation

## 2019-02-12 MED ORDER — AZITHROMYCIN 250 MG PO TABS: ORAL_TABLET | ORAL | 0 refills | Status: DC

## 2019-02-12 MED FILL — FUROSEMIDE 40 MG TAB: 40 | 90 days supply | Qty: 90 | Fill #0

## 2019-02-12 MED FILL — AZITHROMYCIN 250 MG TABLET: 250 | 5 days supply | Qty: 6 | Fill #0

## 2019-02-12 NOTE — Assessment & Plan Note (Addendum)
She admits little exercise since she retired, salty food, fluid retention Heart and lungs unremarkable on exam today Plan- encouraged walking in neighborhood. We can assess further as appropriate.

## 2019-02-12 NOTE — Assessment & Plan Note (Signed)
Nonspecific. Probably early eustachian dysfunction Plan- Zpak

## 2019-02-12 NOTE — Assessment & Plan Note (Signed)
Benefits with improved sleep. Comfortable with pressure. Download confirms. Plan- continue CPAP auto 10-20

## 2019-02-12 NOTE — Progress Notes (Signed)
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 FVC 2.65/76%, FEV1 1.96/74%, ratio 0.74, FEF 25-75 percent 1.44/63%. NPSG 02/13/01 AHI 26/hr  ----------------------------------------------------------------------------------------y.  11/01/2017  68 year old female never smoker, retired Software engineer, followed for OSA, located by morbid obesity, DM 2 CPAP auto 10-20/[facility] -----OSA: DME [facility] Pt states she is wearing CPAP nightly and DL attached.  Weight today 369 pounds Download compliance 100% AHI 0.3/hour.  She is very happy with her new machine and with AutoPap.  She chooses not to use humidifier and accepts some nasal stuffiness and congestion in the mornings.  We talked about saline nasal gel to help nasal dryness. 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  02/11/2019-  68 year old female never smoker, retired Software engineer, followed for OSA, located by morbid obesity, DM 2, HBP,  CPAP auto 10-20/[facility] -----1 year f/u OSA  Body weight today 376 lbs Download compliance 97%, AHI 0.2/ hr Very satisfied with CPAP. Feels [facility] calls too often to replace supplies. Ate a lot of salty crackers yesterday. Feels she retained fluid. Noted dyspnea walking in here today from car, w/o wheeze/ cough. L ear uncomfortable, R ear itching. Concerned she may be developing ear infection.  ROS-see HPI     + = positive Constitutional:    weight loss, No- night sweats, fevers, chills, fatigue, lassitude. HEENT:   No-  headaches, difficulty swallowing, tooth/dental problems, sore throat,       No-  sneezing, itching, +ear ache, nasal congestion, +post nasal drip,  CV:  No-   chest pain, orthopnea, PND, swelling in lower extremities, anasarca,  dizziness, palpitations Resp: + shortness of breath with exertion or at rest.              No-   productive cough,  No non-productive  cough,  No- coughing up of blood.              No-   change in color of mucus.  No- wheezing.   Skin: No-   rash or lesions. GI:  No-   heartburn, indigestion, abdominal pain, nausea, vomiting,  GU: MS:  No-   joint pain or swelling.   Neuro-     nothing unusual Psych:  No- change in mood or affect. No depression or anxiety.  No memory loss.  OBJ- Physical Exam General- Alert, Oriented, Affect-appropriate, Distress- none acute, + morbid obesity, . Skin- rash-none, lesions- none, excoriation- none Lymphadenopathy- none Head- atraumatic            Eyes- Gross vision intact, PERRLA, conjunctivae and secretions clear            Ears- + L TM retracted            Nose- Clear, no-Septal dev, No-polyps, erosion, perforation             Throat- Mallampati IV , mucosa clear , drainage- none, tonsils- atrophic Neck- flexible , trachea midline, no stridor , thyroid nl, carotid no bruit Chest - symmetrical excursion , unlabored           Heart/CV- RRR , 1/6 AS murmur , no gallop  , no rub, nl s1 s2                           - JVD- none , edema- none, stasis changes- none, varices- none           Lung-  clear to P&A, wheeze- none, cough- none , dullness-none, rub- none           Chest wall-  Abd-  Br/ Gen/ Rectal- Not done, not indicated Extrem- cyanosis- none, clubbing, none, atrophy- none, strength- nl Neuro- grossly intact to observation

## 2019-02-12 NOTE — Patient Instructions (Addendum)
We can continue CPAP auto 10-20, mask of choice, humidifier, supplies, Airview/ card  Script for Zpak sent  Please call if we can help

## 2019-02-23 MED FILL — POLY-IRON 150 MG CAPSULE: 150 | 30 days supply | Qty: 30 | Fill #5

## 2019-02-23 MED FILL — DULoxetine HCL 60 MG CPEP: 60 | 30 days supply | Qty: 30 | Fill #2

## 2019-02-23 MED FILL — NOVOLOG FLEXPEN SYRINGE: 100 | 30 days supply | Qty: 33 | Fill #4

## 2019-02-23 MED FILL — TRESIBA FLEXTOUCH 200 UNITS: 200 | 30 days supply | Qty: 18 | Fill #4

## 2019-02-23 MED FILL — SYNJARDY XR 10-1000 MG TB24: 10-1000 | 30 days supply | Qty: 30 | Fill #3

## 2019-02-23 MED FILL — VALSARTAN-HCTZ 320-25 MG TA: 320-25 | 30 days supply | Qty: 30 | Fill #5

## 2019-02-23 MED FILL — DICLOFENAC SODIUM 1 % GEL: 1 | 8 days supply | Qty: 100 | Fill #1

## 2019-03-13 ENCOUNTER — Ambulatory Visit: Payer: Medicare Other | Admitting: Gastroenterology

## 2019-03-22 MED FILL — SYNJARDY XR 10-1000 MG TB24: 10-1000 | 30 days supply | Qty: 30 | Fill #4

## 2019-03-22 MED FILL — TRESIBA FLEXTOUCH 200 UNITS: 200 | 30 days supply | Qty: 18 | Fill #5

## 2019-03-22 MED FILL — ATORVASTATIN 10 MG TABLET: 10 | 90 days supply | Qty: 90 | Fill #1

## 2019-03-22 MED FILL — NOVOLOG FLEXPEN SYRINGE: 100 | 30 days supply | Qty: 33 | Fill #5

## 2019-03-22 MED FILL — VALSARTAN-HCTZ 320-25 MG TA: 320-25 | 30 days supply | Qty: 30 | Fill #6

## 2019-03-22 MED FILL — DICLOFENAC SODIUM 1 % GEL: 1 | 8 days supply | Qty: 100 | Fill #2

## 2019-03-22 MED FILL — POLY-IRON 150 MG CAPSULE: 150 | 30 days supply | Qty: 30 | Fill #6

## 2019-03-22 MED FILL — DULoxetine HCL 60 MG CPEP: 60 | 30 days supply | Qty: 30 | Fill #3

## 2019-04-09 NOTE — Progress Notes (Signed)
Referring Provider: Marylynn Pearson, MD Primary Care Physician:  Reynold Bowen, MD  Reason for Consultation:  Reviewed increased risk for colon cancer   IMPRESSION:  Endometrial cancer diagnosed in 2012 Family history of colon polyps (mother and father) BMI 58.43 Prior colon cancer screening:    - Normal colonoscopy except for internal hemorrhoids 04/20/2002 [facility])    - Normal screening colonoscopy 03/28/13 [facility])  Following endometrial cancer, there may be an increased risk of colon cancer with some gene mutations.  It is unclear if [name] has any of these mutations.  However, she does have a family history of colon polyps in both her mother and her father.  Colonoscopy at 5-year interval is appropriate at this time.  PLAN: Colonoscopy at the hospital  The nature of the procedure, as well as the risks, benefits, and alternatives were carefully and thoroughly reviewed with the patient. Ample time for discussion and questions allowed. The patient understood, was satisfied, and agreed to proceed.  Please see the "Patient Instructions" section for addition details about the plan.  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  The history is obtained to the patient, review of her electronic health record, and records provided by Dr. Julien Girt. 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  She has a history of diabetes, hypercholesterolemia, osteopenia, hypertension, anemia on oral iron supplements, stress related depression, sleep apnea, endometrial carcinoma in 2012 and a thyroid nodule.  She has had a hysterectom in 2002 and a cholecystectomy in 1991.  Y.  She has had multiple gastrointestinal symptoms over the years.  GERD previously treated with PPI. Stopped on her own due to concerns for alzheimers.  Alternating diarrhea and constipation.  Notes a history of hemorrhoids.  Recent onset of  fecal soiling. Increase in urgency. Associated urinary incontinence. Has seen Urology in the past. Wears Poise pads.   Multiple abdominal hernias. Previously repaired at [facility] in [place]. Infected mesh required 18 months of antibiotics in 2014. Her goal is to lose 100 pounds. Will have hernia repair when she achieves this goal.   She would like to have a colonoscopy.  She understands with her history of endometrial cancer that her gynecologist would like her to have a colonoscopy every 5 years.  Prior endoscopic history:  Normal colonoscopy except for internal hemorrhoids 04/20/2002 [facility]) Normal screening colonoscopy 03/28/13 [facility])  Mother and father with colon polyps in their 79s. No other known family history of colon cancer or polyps. No family history of uterine/endometrial cancer, pancreatic cancer or gastric/stomach cancer.   Past Medical History:  Diagnosis Date  . Abdominal pain   . Acid reflux   . Constipation   . Diabetes mellitus   . Diarrhea   . Family history of DVT   . Hemorrhoids   . Hyperlipidemia   . Hypertension   . Joint pain   . Obesity   . Personal history of DVT (deep vein thrombosis)    arm  . Sinus complaint   . Thyroid cancer ([place])   . Thyroid disease   . Uterine cancer ([place])   . Wears glasses     Past Surgical History:  Procedure Laterality Date  . ABDOMINAL HYSTERECTOMY    . APPENDECTOMY    . CESAREAN SECTION    . CHOLECYSTECTOMY    . GASTRIC RESTRICTION SURGERY  04/04/2007   lap band. removed 09/28/12  . HERNIA REPAIR    .  hernia repair surgery     09/2012 with skin grafts  . TONSILLECTOMY AND ADENOIDECTOMY      Current Outpatient Medications  Medication Sig Dispense Refill  . aspirin 81 MG tablet Take 81 mg by mouth daily.      Marland Kitchen atorvastatin (LIPITOR) 10 MG tablet Take 10 mg by mouth daily.    Marland Kitchen azithromycin (ZITHROMAX) 250 MG tablet 2 today then one daily 6 tablet 0  . DULoxetine (CYMBALTA) 60 MG capsule Take 60 mg by mouth  daily.    . Empagliflozin-Metformin HCl ER (SYNJARDY XR) 01-999 MG TB24 Take 1 tablet by mouth daily.    . furosemide (LASIX) 40 MG tablet Take 40 mg by mouth. PRN  Patient stated she takes 20 mg    . levothyroxine (SYNTHROID, LEVOTHROID) 200 MCG tablet Take 200 mcg by mouth daily.      . Multiple Vitamin (MULTIVITAMIN) tablet Take 1 tablet by mouth daily.    Marland Kitchen NOVOLOG FLEXPEN 100 UNIT/ML FlexPen Inject 25-35 Units as directed 2 (two) times daily.  2  . ONE TOUCH ULTRA TEST test strip   6  . pantoprazole (PROTONIX) 40 MG tablet Take 40 mg by mouth daily.    Marland Kitchen POLY-IRON 150 150 MG capsule Take 150 mg by mouth daily.  5  . TRESIBA FLEXTOUCH 200 UNIT/ML SOPN Inject 100 Units as directed every morning.     Marland Kitchen UNIFINE PENTIPS 31G X 5 MM MISC   4  . valsartan-hydrochlorothiazide (DIOVAN HCT) 320-25 MG per tablet Take 1 tablet by mouth daily.     No current facility-administered medications for this visit.    Allergies as of 04/10/2019 - Review Complete 04/10/2019  Allergen Reaction Noted  . Oxaprozin Other (See Comments)   . Ampicillin Itching and Rash   . Codeine Itching and Rash   . Scallops [shellfish allergy] Hives and Itching 03/14/2013    Family History  Problem Relation Age of Onset  . Heart disease Mother   . COPD Mother   . Colon polyps Mother   . Cardiomyopathy Mother   . Prostate cancer Father   . Colon polyps Father   . Factor V Leiden deficiency Father   . Pulmonary embolism Father   . Heart disease Father   . Diabetes Sister   . Asthma Sister   . Fibromyalgia Sister   . Factor V Leiden deficiency Sister   . Liver disease Sister   . Kidney disease Sister   . Colon cancer Neg Hx   . Pancreatic cancer Neg Hx   . Stomach cancer Neg Hx     Social History   Socioeconomic History  . Marital status: Married    Spouse name: Not on file  . Number of children: 1  . Years of education: Not on file  . Highest education level: Not on file  Occupational History  .  Occupation: Retired Software engineer  Tobacco Use  . Smoking status: Never Smoker  . Smokeless tobacco: Never Used  Substance and Sexual Activity  . Alcohol use: No  . Drug use: No  . Sexual activity: Not on file  Other Topics Concern  . Not on file  Social History Narrative  . Not on file   Social Determinants of Health   Financial Resource Strain:   . Difficulty of Paying Living Expenses: Not on file  Food Insecurity:   . Worried About Charity fundraiser in the Last Year: Not on file  . Ran Out of Food in the  Last Year: Not on file  Transportation Needs:   . Lack of Transportation (Medical): Not on file  . Lack of Transportation (Non-Medical): Not on file  Physical Activity:   . Days of Exercise per Week: Not on file  . Minutes of Exercise per Session: Not on file  Stress:   . Feeling of Stress : Not on file  Social Connections:   . Frequency of Communication with Friends and Family: Not on file  . Frequency of Social Gatherings with Friends and Family: Not on file  . Attends Religious Services: Not on file  . Active Member of Clubs or Organizations: Not on file  . Attends Archivist Meetings: Not on file  . Marital Status: Not on file  Intimate Partner Violence:   . Fear of Current or Ex-Partner: Not on file  . Emotionally Abused: Not on file  . Physically Abused: Not on file  . Sexually Abused: Not on file    Review of Systems: 12 system ROS is negative except as noted above with the additions of back pain, muscle pains intermittent rash and urinary incontinence.   Physical Exam: General:   Alert,  well-nourished, pleasant and cooperative in NAD Head:  Normocephalic and atraumatic. Eyes:  Sclera clear, no icterus.   Conjunctiva pink. Ears:  Normal auditory acuity. Nose:  No deformity, discharge,  or lesions. Mouth:  No deformity or lesions.   Neck:  Supple; no masses or thyromegaly. Lungs:  Clear throughout to auscultation.   No wheezes. Heart:  Regular  rate and rhythm; no murmurs. Abdomen:  Soft, central obesity, large abdominal wall scar with associated hernia, nontender, nondistended, normal bowel sounds, no rebound or guarding. No hepatosplenomegaly.   Rectal:  Deferred  Msk:  Symmetrical. No boney deformities LAD: No inguinal or umbilical LAD Extremities:  No clubbing or edema. Neurologic:  Alert and  oriented x4;  grossly nonfocal Skin:  Intact without significant lesions or rashes. Psych:  Alert and cooperative. Normal mood and affect.    Linsey Hirota L. Tarri Glenn, MD, MPH 04/10/2019, 3:19 PM

## 2019-04-10 ENCOUNTER — Encounter: Payer: Self-pay | Admitting: Gastroenterology

## 2019-04-10 ENCOUNTER — Ambulatory Visit (INDEPENDENT_AMBULATORY_CARE_PROVIDER_SITE_OTHER): Payer: Medicare Other | Admitting: Gastroenterology

## 2019-04-10 VITALS — BP 154/56 | HR 80 | Temp 98.5°F | Ht 67.0 in | Wt 373.1 lb

## 2019-04-10 DIAGNOSIS — Z8371 Family history of colonic polyps: Secondary | ICD-10-CM

## 2019-04-10 DIAGNOSIS — Z1159 Encounter for screening for other viral diseases: Secondary | ICD-10-CM

## 2019-04-10 DIAGNOSIS — Z6841 Body Mass Index (BMI) 40.0 and over, adult: Secondary | ICD-10-CM | POA: Diagnosis not present

## 2019-04-10 MED ORDER — NA SULFATE-K SULFATE-MG SULF 17.5-3.13-1.6 GM/177ML PO SOLN: 1.0000 | ORAL | 0 refills | Status: AC

## 2019-04-10 MED FILL — SUPREP BOWEL PREP KIT: 17.5-3.13-1 | 1 days supply | Qty: 354 | Fill #0

## 2019-04-10 NOTE — Patient Instructions (Addendum)
You have been scheduled for a colonoscopy. Please follow written instructions given to you at your visit today.  Please pick up your prep supplies at the pharmacy within the next 1-3 days. If you use inhalers (even only as needed), please bring them with you on the day of your procedure.  Due to recent COVID-19 restrictions implemented by our local and state authorities and in an effort to keep both patients and staff as safe as possible, our hospital system now requires COVID-19 testing prior to any scheduled hospital procedure. Please go to our [facility] location drive thru testing site ([address], [place], [place] [address]) on 04-30-19 at  1:00 pm. There will be multiple testing areas, the first checkpoint being for pre-procedure/surgery testing. Get into the right (yellow) lane that leads to the PAT testing team. You will not be billed at the time of testing but may receive a bill later depending on your insurance. The approximate cost of the test is $100. You must agree to quarantine from the time of your testing until the procedure date on 05-03-19 . This should include staying at home with ONLY the people you live with. Avoid take-out, grocery store shopping or leaving the house for any non-emergent reason. Failure to have your COVID-19 test done on the date and time you have been scheduled will result in cancellation of procedure. Please call our office at [phone] if you have any questions.

## 2019-04-10 NOTE — Progress Notes (Signed)
Image of abdominal wall captured during office visit.

## 2019-04-23 MED FILL — SYNJARDY XR 10-1000 MG TB24: 10-1000 | 30 days supply | Qty: 30 | Fill #5

## 2019-04-23 MED FILL — UNIFINE PENTIPS 8MM 31G: 31G X 8 MM | 90 days supply | Qty: 300 | Fill #1

## 2019-04-23 MED FILL — NOVOLOG FLEXPEN SYRINGE: 100 | 30 days supply | Qty: 33 | Fill #6

## 2019-04-23 MED FILL — TRESIBA FLEXTOUCH 200 UNITS: 200 | 30 days supply | Qty: 18 | Fill #6

## 2019-04-23 MED FILL — DULoxetine HCL 60 MG CPEP: 60 | 30 days supply | Qty: 30 | Fill #4

## 2019-04-24 DIAGNOSIS — Z20828 Contact with and (suspected) exposure to other viral communicable diseases: Secondary | ICD-10-CM | POA: Diagnosis not present

## 2019-04-24 DIAGNOSIS — U071 COVID-19: Secondary | ICD-10-CM | POA: Diagnosis not present

## 2019-04-25 ENCOUNTER — Telehealth: Payer: Self-pay | Admitting: Internal Medicine

## 2019-04-25 ENCOUNTER — Other Ambulatory Visit: Payer: Self-pay | Admitting: Internal Medicine

## 2019-04-25 ENCOUNTER — Other Ambulatory Visit: Payer: Medicare Other

## 2019-04-25 DIAGNOSIS — I10 Essential (primary) hypertension: Secondary | ICD-10-CM

## 2019-04-25 DIAGNOSIS — U071 COVID-19: Secondary | ICD-10-CM

## 2019-04-25 NOTE — Telephone Encounter (Signed)
  I connected by phone with [name] on 04/25/2019 at 10:36 AM to discuss the potential use of an new treatment for mild to moderate COVID-19 viral infection in non-hospitalized patients.  This patient is a 69 y.o. female that meets the FDA criteria for Emergency Use Authorization of bamlanivimab or casirivimab\imdevimab.  Has a (+) direct SARS-CoV-2 viral test result  Has mild or moderate COVID-19   Is ? 72 years of age and weighs ? 40 kg  Is NOT hospitalized due to COVID-19  Is NOT requiring oxygen therapy or requiring an increase in baseline oxygen flow rate due to COVID-19  Is within 10 days of symptom onset  Has at least one of the high risk factor(s) for progression to severe COVID-19 and/or hospitalization as defined in EUA. Specific high risk criteria : >/= 71 yo, BMI > 35, htn, DM2  I have spoken and communicated the following to the patient or parent/caregiver:  1. FDA has authorized the emergency use of bamlanivimab and casirivimab\imdevimab for the treatment of mild to moderate COVID-19 in adults and pediatric patients with positive results of direct SARS-CoV-2 viral testing who are 37 years of age and older weighing at least 40 kg, and who are at high risk for progressing to severe COVID-19 and/or hospitalization.  2. The significant known and potential risks and benefits of bamlanivimab and casirivimab\imdevimab, and the extent to which such potential risks and benefits are unknown.  3. Information on available alternative treatments and the risks and benefits of those alternatives, including clinical trials.  4. Patients treated with bamlanivimab and casirivimab\imdevimab should continue to self-isolate and use infection control measures (e.g., wear mask, isolate, social distance, avoid sharing personal items, clean and disinfect "high touch" surfaces, and frequent handwashing) according to CDC guidelines.   5. The patient or parent/caregiver has the option to accept  or refuse bamlanivimab or casirivimab\imdevimab .  After reviewing this information with the patient, The patient agreed to proceed with receiving the bamlanimivab infusion and will be provided a copy of the Fact sheet prior to receiving the infusion.Alan Ripper, NP-C [place]

## 2019-04-26 MED FILL — VIT D2 1.25 MG (50,000 UNIT: 1.25 MG | 84 days supply | Qty: 12 | Fill #2

## 2019-04-27 ENCOUNTER — Ambulatory Visit (HOSPITAL_COMMUNITY)
Admission: RE | Admit: 2019-04-27 | Discharge: 2019-04-27 | Disposition: A | Discharge status: Home / Self Care | Payer: Medicare Other | Source: Ambulatory Visit | Attending: Pulmonary Disease | Admitting: Pulmonary Disease

## 2019-04-27 DIAGNOSIS — I10 Essential (primary) hypertension: Secondary | ICD-10-CM | POA: Insufficient documentation

## 2019-04-27 DIAGNOSIS — U071 COVID-19: Secondary | ICD-10-CM

## 2019-04-27 DIAGNOSIS — Z23 Encounter for immunization: Secondary | ICD-10-CM | POA: Insufficient documentation

## 2019-04-27 MED ORDER — DIPHENHYDRAMINE HCL 50 MG/ML IJ SOLN: 50.0000 mg | Freq: Once | INTRAMUSCULAR | Status: DC | PRN

## 2019-04-27 MED ORDER — METHYLPREDNISOLONE SODIUM SUCC 125 MG IJ SOLR: 125.0000 mg | Freq: Once | INTRAMUSCULAR | Status: DC | PRN

## 2019-04-27 MED ORDER — FAMOTIDINE IN NACL 20-0.9 MG/50ML-% IV SOLN: 20.0000 mg | Freq: Once | INTRAVENOUS | Status: DC | PRN

## 2019-04-27 MED ORDER — SODIUM CHLORIDE 0.9 % IV SOLN
700.0000 mg | Freq: Once | INTRAVENOUS | Status: AC
  Administered 2019-04-27: 700 mg via INTRAVENOUS
  Filled 2019-04-27: qty 20

## 2019-04-27 MED ORDER — SODIUM CHLORIDE 0.9 % IV SOLN: INTRAVENOUS | Status: DC | PRN

## 2019-04-27 MED ORDER — ALBUTEROL SULFATE HFA 108 (90 BASE) MCG/ACT IN AERS: 2.0000 | INHALATION_SPRAY | Freq: Once | RESPIRATORY_TRACT | Status: DC | PRN

## 2019-04-27 MED ORDER — EPINEPHRINE 0.3 MG/0.3ML IJ SOAJ: 0.3000 mg | Freq: Once | INTRAMUSCULAR | Status: DC | PRN

## 2019-04-27 MED FILL — POLY-IRON 150 MG CAPSULE: 150 | 30 days supply | Qty: 30 | Fill #0

## 2019-04-27 MED FILL — VALSARTAN-HCTZ 320-25 MG TA: 320-25 | 90 days supply | Qty: 90 | Fill #0

## 2019-04-27 MED FILL — LEVOTHYROXINE 200 MCG TAB: 200 | 90 days supply | Qty: 90 | Fill #0

## 2019-04-27 NOTE — Progress Notes (Signed)
  Diagnosis: COVID-19  Physician: Dr. Joya Gaskins  Procedure: [facility] Med: bamlanivimab infusion - Provided patient with bamlanimivab fact sheet for patients, parents and caregivers prior to infusion.  Complications: No immediate complications noted.  Discharge: Discharged home   [name] 04/27/2019

## 2019-04-27 NOTE — Discharge Instructions (Signed)

## 2019-04-30 ENCOUNTER — Other Ambulatory Visit (HOSPITAL_COMMUNITY): Admission: RE | Admit: 2019-04-30 | Payer: Medicare Other | Source: Ambulatory Visit

## 2019-04-30 ENCOUNTER — Other Ambulatory Visit (HOSPITAL_COMMUNITY): Payer: Medicare Other

## 2019-05-03 ENCOUNTER — Encounter (HOSPITAL_COMMUNITY): Payer: Self-pay

## 2019-05-03 ENCOUNTER — Ambulatory Visit (HOSPITAL_COMMUNITY): Admit: 2019-05-03 | Payer: Medicare Other | Admitting: Gastroenterology

## 2019-05-03 SURGERY — COLONOSCOPY WITH PROPOFOL
Anesthesia: Monitor Anesthesia Care

## 2019-05-22 DIAGNOSIS — E042 Nontoxic multinodular goiter: Secondary | ICD-10-CM | POA: Diagnosis not present

## 2019-05-22 DIAGNOSIS — U071 COVID-19: Secondary | ICD-10-CM | POA: Diagnosis not present

## 2019-05-22 DIAGNOSIS — E1149 Type 2 diabetes mellitus with other diabetic neurological complication: Secondary | ICD-10-CM | POA: Diagnosis not present

## 2019-05-22 DIAGNOSIS — M545 Low back pain: Secondary | ICD-10-CM | POA: Diagnosis not present

## 2019-05-22 DIAGNOSIS — G4730 Sleep apnea, unspecified: Secondary | ICD-10-CM | POA: Diagnosis not present

## 2019-05-22 DIAGNOSIS — N1831 Chronic kidney disease, stage 3a: Secondary | ICD-10-CM | POA: Diagnosis not present

## 2019-05-22 DIAGNOSIS — C541 Malignant neoplasm of endometrium: Secondary | ICD-10-CM | POA: Diagnosis not present

## 2019-05-22 DIAGNOSIS — E785 Hyperlipidemia, unspecified: Secondary | ICD-10-CM | POA: Diagnosis not present

## 2019-05-22 DIAGNOSIS — I129 Hypertensive chronic kidney disease with stage 1 through stage 4 chronic kidney disease, or unspecified chronic kidney disease: Secondary | ICD-10-CM | POA: Diagnosis not present

## 2019-05-22 DIAGNOSIS — E1142 Type 2 diabetes mellitus with diabetic polyneuropathy: Secondary | ICD-10-CM | POA: Diagnosis not present

## 2019-05-22 DIAGNOSIS — B998 Other infectious disease: Secondary | ICD-10-CM | POA: Diagnosis not present

## 2019-05-23 ENCOUNTER — Other Ambulatory Visit (HOSPITAL_COMMUNITY): Payer: Self-pay | Admitting: Endocrinology

## 2019-05-23 MED FILL — TRESIBA FLEXTOUCH 200 UNITS: 200 | 30 days supply | Qty: 18 | Fill #7

## 2019-05-23 MED FILL — POLY-IRON 150 MG CAPSULE: 150 | 30 days supply | Qty: 30 | Fill #1

## 2019-05-23 MED FILL — DULoxetine HCL 60 MG CPEP: 60 | 30 days supply | Qty: 30 | Fill #5

## 2019-05-23 MED FILL — NOVOLOG FLEXPEN SYRINGE: 100 | 30 days supply | Qty: 33 | Fill #7

## 2019-05-23 MED FILL — SYNJARDY XR 10-1000 MG TB24: 10-1000 | 30 days supply | Qty: 30 | Fill #0

## 2019-05-30 DIAGNOSIS — E1149 Type 2 diabetes mellitus with other diabetic neurological complication: Secondary | ICD-10-CM | POA: Diagnosis not present

## 2019-06-15 MED FILL — DULoxetine HCL 60 MG CPEP: 60 | 30 days supply | Qty: 30 | Fill #0

## 2019-06-15 MED FILL — ATORVASTATIN 10 MG TABLET: 10 | 90 days supply | Qty: 90 | Fill #2

## 2019-06-21 ENCOUNTER — Ambulatory Visit: Payer: BLUE CROSS/BLUE SHIELD

## 2019-06-25 MED FILL — POLY-IRON 150 MG CAPSULE: 150 | 30 days supply | Qty: 30 | Fill #2

## 2019-06-25 MED FILL — SYNJARDY XR 10-1000 MG TB24: 10-1000 | 30 days supply | Qty: 30 | Fill #1

## 2019-06-25 MED FILL — TRESIBA FLEXTOUCH 200 UNITS: 200 | 30 days supply | Qty: 18 | Fill #8

## 2019-07-17 ENCOUNTER — Ambulatory Visit: Payer: Medicare Other

## 2019-07-23 MED FILL — VALSARTAN-HCTZ 320-25 MG TA: 320-25 | 90 days supply | Qty: 90 | Fill #1

## 2019-07-23 MED FILL — LEVOTHYROXINE SODIUM 200 MC: 200 | 90 days supply | Qty: 90 | Fill #1

## 2019-07-23 MED FILL — POLY-IRON 150 MG CAPSULE: 150 | 30 days supply | Qty: 30 | Fill #3

## 2019-07-23 MED FILL — SYNJARDY XR 10-1000 MG TB24: 10-1000 | 30 days supply | Qty: 30 | Fill #2

## 2019-07-23 MED FILL — DULoxetine HCL 60 MG CPEP: 60 | 30 days supply | Qty: 30 | Fill #1

## 2019-07-26 MED FILL — TRESIBA FLEXTOUCH 200 UNITS: 200 | 30 days supply | Qty: 18 | Fill #9

## 2019-07-26 MED FILL — UNIFINE PENTIPS 8MM 31G: 31G X 8 MM | 90 days supply | Qty: 300 | Fill #0

## 2019-07-27 ENCOUNTER — Other Ambulatory Visit (HOSPITAL_COMMUNITY): Payer: Self-pay | Admitting: Endocrinology

## 2019-07-30 ENCOUNTER — Other Ambulatory Visit (HOSPITAL_COMMUNITY): Payer: Self-pay | Admitting: Endocrinology

## 2019-07-30 MED FILL — NOVOLOG FLEXPEN SYRINGE: 100 | 30 days supply | Qty: 33 | Fill #8

## 2019-07-30 MED FILL — VIT D2 1.25 MG (50,000 UNIT: 1.25 MG | 84 days supply | Qty: 12 | Fill #0

## 2019-07-31 ENCOUNTER — Telehealth: Payer: Self-pay | Admitting: Gastroenterology

## 2019-08-09 ENCOUNTER — Other Ambulatory Visit: Payer: Self-pay

## 2019-08-09 DIAGNOSIS — Z1211 Encounter for screening for malignant neoplasm of colon: Secondary | ICD-10-CM

## 2019-08-09 NOTE — Telephone Encounter (Signed)
Pt scheduled for previsit 6/28@2pm , 7/8@9am  for covid test, Colon at [facility] 10/15/19 at 8:30am. Left message for pt to call back.  Spoke with pt and she is aware of appts.

## 2019-08-13 DIAGNOSIS — H524 Presbyopia: Secondary | ICD-10-CM | POA: Diagnosis not present

## 2019-08-13 DIAGNOSIS — E119 Type 2 diabetes mellitus without complications: Secondary | ICD-10-CM | POA: Diagnosis not present

## 2019-08-13 DIAGNOSIS — H43813 Vitreous degeneration, bilateral: Secondary | ICD-10-CM | POA: Diagnosis not present

## 2019-08-13 DIAGNOSIS — H04123 Dry eye syndrome of bilateral lacrimal glands: Secondary | ICD-10-CM | POA: Diagnosis not present

## 2019-08-27 MED FILL — NOVOLOG FLEXPEN SYRINGE: 100 | 30 days supply | Qty: 33 | Fill #9

## 2019-08-27 MED FILL — POLY-IRON 150 MG CAPSULE: 150 | 30 days supply | Qty: 30 | Fill #4

## 2019-08-27 MED FILL — SYNJARDY XR 10-1000 MG TB24: 10-1000 | 30 days supply | Qty: 30 | Fill #3

## 2019-08-27 MED FILL — TRESIBA FLEXTOUCH 200 UNITS: 200 | 30 days supply | Qty: 18 | Fill #0

## 2019-08-27 MED FILL — DULoxetine HCL 60 MG CPEP: 60 | 30 days supply | Qty: 30 | Fill #2

## 2019-09-06 ENCOUNTER — Telehealth: Payer: Self-pay | Admitting: Gastroenterology

## 2019-09-06 NOTE — Telephone Encounter (Signed)
Left message for pt that no call was placed from our office last week to her and that if she has any further questions to call us back.

## 2019-09-13 ENCOUNTER — Other Ambulatory Visit: Payer: Self-pay | Admitting: Obstetrics and Gynecology

## 2019-09-13 DIAGNOSIS — Z1231 Encounter for screening mammogram for malignant neoplasm of breast: Secondary | ICD-10-CM

## 2019-09-19 MED FILL — TRESIBA FLEXTOUCH 200 UNITS: 200 | 30 days supply | Qty: 18 | Fill #1

## 2019-09-19 MED FILL — DULoxetine HCL 60 MG CPEP: 60 | 30 days supply | Qty: 30 | Fill #3

## 2019-09-19 MED FILL — NOVOLOG FLEXPEN SYRINGE: 100 | 30 days supply | Qty: 33 | Fill #10

## 2019-09-19 MED FILL — SYNJARDY XR 10-1000 MG TB24: 10-1000 | 30 days supply | Qty: 30 | Fill #4

## 2019-09-19 MED FILL — ATORVASTATIN CALCIUM 10 MG: 10 | 90 days supply | Qty: 90 | Fill #3

## 2019-09-19 MED FILL — POLY-IRON 150 MG CAPSULE: 150 | 30 days supply | Qty: 30 | Fill #5

## 2019-10-01 ENCOUNTER — Ambulatory Visit (AMBULATORY_SURGERY_CENTER): Payer: Self-pay | Admitting: *Deleted

## 2019-10-01 ENCOUNTER — Other Ambulatory Visit: Payer: Self-pay

## 2019-10-01 VITALS — Ht 67.0 in | Wt 377.0 lb

## 2019-10-01 DIAGNOSIS — Z8371 Family history of colonic polyps: Secondary | ICD-10-CM

## 2019-10-01 DIAGNOSIS — Z01818 Encounter for other preprocedural examination: Secondary | ICD-10-CM

## 2019-10-01 NOTE — Progress Notes (Signed)

## 2019-10-11 ENCOUNTER — Other Ambulatory Visit (HOSPITAL_COMMUNITY)
Admission: RE | Admit: 2019-10-11 | Discharge: 2019-10-11 | Disposition: A | Discharge status: Home / Self Care | Payer: Medicare Other | Source: Ambulatory Visit | Attending: Gastroenterology | Admitting: Gastroenterology

## 2019-10-11 DIAGNOSIS — Z01812 Encounter for preprocedural laboratory examination: Secondary | ICD-10-CM | POA: Diagnosis not present

## 2019-10-11 DIAGNOSIS — Z20822 Contact with and (suspected) exposure to covid-19: Secondary | ICD-10-CM | POA: Diagnosis not present

## 2019-10-11 LAB — SARS CORONAVIRUS 2 (TAT 6-24 HRS): SARS Coronavirus 2: NEGATIVE

## 2019-10-14 ENCOUNTER — Encounter (HOSPITAL_COMMUNITY): Payer: Self-pay | Admitting: Gastroenterology

## 2019-10-15 ENCOUNTER — Telehealth: Payer: Self-pay

## 2019-10-15 ENCOUNTER — Encounter (HOSPITAL_COMMUNITY): Payer: Self-pay | Admitting: Gastroenterology

## 2019-10-15 ENCOUNTER — Ambulatory Visit (HOSPITAL_COMMUNITY): Payer: Medicare Other | Admitting: Anesthesiology

## 2019-10-15 ENCOUNTER — Encounter (HOSPITAL_COMMUNITY)
Admission: RE | Disposition: A | Discharge status: Home / Self Care | Payer: Self-pay | Source: Home / Self Care | Attending: Gastroenterology

## 2019-10-15 ENCOUNTER — Other Ambulatory Visit: Payer: Self-pay

## 2019-10-15 ENCOUNTER — Ambulatory Visit (HOSPITAL_COMMUNITY)
Admission: RE | Admit: 2019-10-15 | Discharge: 2019-10-15 | Disposition: A | Discharge status: Home / Self Care | Payer: Medicare Other | Attending: Gastroenterology | Admitting: Gastroenterology

## 2019-10-15 DIAGNOSIS — Z86718 Personal history of other venous thrombosis and embolism: Secondary | ICD-10-CM | POA: Diagnosis not present

## 2019-10-15 DIAGNOSIS — Z1211 Encounter for screening for malignant neoplasm of colon: Secondary | ICD-10-CM | POA: Insufficient documentation

## 2019-10-15 DIAGNOSIS — Z8542 Personal history of malignant neoplasm of other parts of uterus: Secondary | ICD-10-CM | POA: Insufficient documentation

## 2019-10-15 DIAGNOSIS — Z833 Family history of diabetes mellitus: Secondary | ICD-10-CM | POA: Insufficient documentation

## 2019-10-15 DIAGNOSIS — E1136 Type 2 diabetes mellitus with diabetic cataract: Secondary | ICD-10-CM | POA: Diagnosis not present

## 2019-10-15 DIAGNOSIS — H269 Unspecified cataract: Secondary | ICD-10-CM | POA: Insufficient documentation

## 2019-10-15 DIAGNOSIS — K219 Gastro-esophageal reflux disease without esophagitis: Secondary | ICD-10-CM | POA: Insufficient documentation

## 2019-10-15 DIAGNOSIS — K5730 Diverticulosis of large intestine without perforation or abscess without bleeding: Secondary | ICD-10-CM | POA: Insufficient documentation

## 2019-10-15 DIAGNOSIS — Z6841 Body Mass Index (BMI) 40.0 and over, adult: Secondary | ICD-10-CM | POA: Insufficient documentation

## 2019-10-15 DIAGNOSIS — Z8249 Family history of ischemic heart disease and other diseases of the circulatory system: Secondary | ICD-10-CM | POA: Diagnosis not present

## 2019-10-15 DIAGNOSIS — Z85850 Personal history of malignant neoplasm of thyroid: Secondary | ICD-10-CM | POA: Insufficient documentation

## 2019-10-15 DIAGNOSIS — Z9049 Acquired absence of other specified parts of digestive tract: Secondary | ICD-10-CM | POA: Insufficient documentation

## 2019-10-15 DIAGNOSIS — Z8371 Family history of colonic polyps: Secondary | ICD-10-CM | POA: Diagnosis not present

## 2019-10-15 DIAGNOSIS — E669 Obesity, unspecified: Secondary | ICD-10-CM | POA: Insufficient documentation

## 2019-10-15 DIAGNOSIS — G4730 Sleep apnea, unspecified: Secondary | ICD-10-CM | POA: Diagnosis not present

## 2019-10-15 DIAGNOSIS — I10 Essential (primary) hypertension: Secondary | ICD-10-CM | POA: Diagnosis not present

## 2019-10-15 DIAGNOSIS — Z8719 Personal history of other diseases of the digestive system: Secondary | ICD-10-CM | POA: Diagnosis not present

## 2019-10-15 DIAGNOSIS — K602 Anal fissure, unspecified: Secondary | ICD-10-CM | POA: Diagnosis not present

## 2019-10-15 HISTORY — PX: COLONOSCOPY WITH PROPOFOL: SHX5780

## 2019-10-15 HISTORY — DX: Family history of other specified conditions: Z84.89

## 2019-10-15 LAB — GLUCOSE, CAPILLARY: Glucose-Capillary: 122 mg/dL — ABNORMAL HIGH (ref 70–99)

## 2019-10-15 SURGERY — COLONOSCOPY WITH PROPOFOL
Anesthesia: Monitor Anesthesia Care

## 2019-10-15 MED ORDER — LACTATED RINGERS IV SOLN
INTRAVENOUS | Status: DC
  Administered 2019-10-15: 1000 mL via INTRAVENOUS

## 2019-10-15 MED ORDER — PROPOFOL 500 MG/50ML IV EMUL
INTRAVENOUS | Status: DC | PRN
  Administered 2019-10-15 (×2): 20 mg via INTRAVENOUS

## 2019-10-15 MED ORDER — ONDANSETRON HCL 4 MG/2ML IJ SOLN
INTRAMUSCULAR | Status: DC | PRN
  Administered 2019-10-15: 4 mg via INTRAVENOUS

## 2019-10-15 MED ORDER — SODIUM CHLORIDE 0.9 % IV SOLN: INTRAVENOUS | Status: DC

## 2019-10-15 MED ORDER — LIDOCAINE HCL (CARDIAC) PF 100 MG/5ML IV SOSY
PREFILLED_SYRINGE | INTRAVENOUS | Status: DC | PRN
  Administered 2019-10-15: 80 mg via INTRAVENOUS

## 2019-10-15 MED ORDER — PROPOFOL 500 MG/50ML IV EMUL
INTRAVENOUS | Status: DC | PRN
  Administered 2019-10-15: 125 ug/kg/min via INTRAVENOUS

## 2019-10-15 MED ORDER — PROPOFOL 1000 MG/100ML IV EMUL
INTRAVENOUS | Status: AC
  Filled 2019-10-15: qty 100

## 2019-10-15 SURGICAL SUPPLY — 22 items

## 2019-10-15 NOTE — Op Note (Signed)
[facility] Patient Name: [name] Procedure Date: 10/15/2019 MRN: [id number] Attending MD: Thornton Park MD, MD Date of Birth: 1951/01/21 CSN: [id number] Age: 69 Admit Type: Outpatient Procedure:                Colonoscopy Indications:              Colon cancer screening in patient at increased                            risk: Family history of colon polyps in multiple                            1st-degree relatives                           Family history of colon polyps (mother and father)                           Prior colon cancer screening:                           - Normal colonoscopy except for internal                            hemorrhoids 04/20/2002 [facility])                           - Normal screening colonoscopy 03/28/13 [facility] P[facility]) Providers:                Thornton Park MD, MD, Cleda Daub, RN, Cletis Athens, Technician, Dayle Points, CRNA Referring MD:              Medicines:                Monitored Anesthesia Care Complications:            No immediate complications. Estimated Blood Loss:     Estimated blood loss: none. Procedure:                Pre-Anesthesia Assessment:                           - Prior to the procedure, a History and Physical                            was performed, and patient medications and                            allergies were reviewed. The patient's tolerance of                            previous anesthesia was also reviewed. The risks                            and benefits of the procedure and the sedation  options and risks were discussed with the patient.                            All questions were answered, and informed consent                            was obtained. Prior Anticoagulants: The patient has                            taken no previous anticoagulant or antiplatelet                            agents. ASA Grade Assessment: III - A patient with                             severe systemic disease. After reviewing the risks                            and benefits, the patient was deemed in                            satisfactory condition to undergo the procedure.                           After obtaining informed consent, the colonoscope                            was passed under direct vision. Throughout the                            procedure, the patient's blood pressure, pulse, and                            oxygen saturations were monitored continuously. The                            CF-HQ190L ([id number]) Olympus colonoscope was                            introduced through the anus and advanced to the the                            cecum, identified by appendiceal orifice and                            ileocecal valve. A second forward view of the right                            colon was performed. The colonoscopy was performed                            without difficulty. The patient tolerated the  procedure well. The quality of the bowel                            preparation was good. The ileocecal valve,                            appendiceal orifice, and rectum were photographed. Scope In: 8:54:06 AM Scope Out: 9:11:58 AM Scope Withdrawal Time: 0 hours 12 minutes 22 seconds  Total Procedure Duration: 0 hours 17 minutes 52 seconds  Findings:      The perianal and digital rectal examinations were normal except for an       anterior fissure.      A few small-mouthed diverticula were found in the sigmoid colon.      The exam was otherwise without abnormality on direct and retroflexion       views. Impression:               - Non-bleeding anterior fissure.                           - Diverticulosis in the sigmoid colon.                           - The examination was otherwise normal on direct                            and retroflexion views.                           - No specimens  collected. Moderate Sedation:      Not Applicable - Patient had care per Anesthesia. Recommendation:           - Patient has a contact number available for                            emergencies. The signs and symptoms of potential                            delayed complications were discussed with the                            patient. Return to normal activities tomorrow.                            Written discharge instructions were provided to the                            patient.                           - Follow a high fiber diet. Drink at least 64                            ounces of water daily. Add a daily stool bulking  agent such as psyllium (an exampled would be                            Metamucil) twice daily to keep the stool soft.                           - Continue present medications.                           - Repeat colonoscopy in 5 years for surveillance                            given your family history.                           - Sitz baths 2-3 times daily for pain relief.                           - Nitroglycerin 0.125% gel applied to the rectum                            TID x 8 weeks (not to be used with concurrent                            phosphodiesterase inhibitors                           - Emerging evidence supports eating a diet of                            fruits, vegetables, grains, calcium, and yogurt                            while reducing red meat and alcohol may reduce the                            risk of colon cancer.                           - Thank you for allowing me to be involved in your                            colon cancer prevention. Procedure Code(s):        --- Professional ---                           Z6109, Colorectal cancer screening; colonoscopy on                            individual at high risk Diagnosis Code(s):        --- Professional ---                           Z83.71, Family  history of colonic polyps  K57.30, Diverticulosis of large intestine without                            perforation or abscess without bleeding CPT copyright 2019 American Medical Association. All rights reserved. The codes documented in this report are preliminary and upon coder review may  be revised to meet current compliance requirements. Thornton Park MD, MD 10/15/2019 9:28:03 AM This report has been signed electronically. Number of Addenda: 0

## 2019-10-15 NOTE — Discharge Instructions (Signed)

## 2019-10-15 NOTE — Transfer of Care (Signed)
Immediate Anesthesia Transfer of Care Note  Patient: [name]  Procedure(s) Performed: Procedure(s): COLONOSCOPY WITH PROPOFOL (N/A)  Patient Location: PACU  Anesthesia Type:MAC  Level of Consciousness:  sedated, patient cooperative and responds to stimulation  Airway & Oxygen Therapy:Patient Spontanous Breathing and Patient connected to face mask oxgen  Post-op Assessment:  Report given to PACU RN and Post -op Vital signs reviewed and stable  Post vital signs:  Reviewed and stable  Last Vitals:  Vitals:   10/15/19 0731  BP: (!) 169/70  Pulse: 80  Resp: 20  Temp: 36.7 C  SpO2: 95%    Complications: No apparent anesthesia complications

## 2019-10-15 NOTE — Anesthesia Postprocedure Evaluation (Signed)
Anesthesia Post Note  Patient: [name]  Procedure(s) Performed: COLONOSCOPY WITH PROPOFOL (N/A )     Patient location during evaluation: Endoscopy Anesthesia Type: MAC Level of consciousness: awake Pain management: pain level controlled Vital Signs Assessment: post-procedure vital signs reviewed and stable Respiratory status: spontaneous breathing Cardiovascular status: stable Postop Assessment: no apparent nausea or vomiting Anesthetic complications: no   No complications documented.  Last Vitals:  Vitals:   10/15/19 0930 10/15/19 0940  BP: (!) 150/65 130/66  Pulse: 76 71  Resp: 17 13  Temp:    SpO2: 94% 94%    Last Pain:  Vitals:   10/15/19 0940  TempSrc:   PainSc: 0-No pain   Pain Goal:                   Huston Foley

## 2019-10-15 NOTE — Telephone Encounter (Signed)
-----   Message from Thornton Park, MD sent at 10/15/2019  9:33 AM EDT ----- Patient has anal fissure on colonoscopy. Please start:  Nitroglycerin 0.125% gel applied to the rectum TID 8 weeks (not to be used with concurrent phosphodiesterase inhibitors)  Office visit with me in 8 weeks. Thank you.

## 2019-10-15 NOTE — H&P (Signed)
Referring Provider: No ref. provider found Primary Care Physician:  Reynold Bowen, MD  Reason for Consultation:  Reviewed increased risk for colon cancer   IMPRESSION:  Endometrial cancer diagnosed in 2012 Family history of colon polyps (mother and father) BMI 58.43 Prior colon cancer screening:    - Normal colonoscopy except for internal hemorrhoids 04/20/2002 [facility])    - Normal screening colonoscopy 03/28/13 [facility])  Following endometrial cancer, there may be an increased risk of colon cancer with some gene mutations.  It is unclear if [name] has any of these mutations.  However, she does have a family history of colon polyps in both her mother and her father.  Colonoscopy at 5-year interval is appropriate at this time.  PLAN: Colonoscopy   The nature of the procedure, as well as the risks, benefits, and alternatives were carefully and thoroughly reviewed with the patient. Ample time for discussion and questions allowed. The patient understood, was satisfied, and agreed to proceed.   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  The history is obtained to the patient, review of her electronic health record, and records provided by Dr. Julien Girt. 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  She has a history of diabetes, hypercholesterolemia, osteopenia, hypertension, anemia on oral iron supplements, stress related depression, sleep apnea, endometrial carcinoma in 2012 and a thyroid nodule.  She has had a hysterectom in 2002 and a cholecystectomy in 1991.  She has had multiple gastrointestinal symptoms over the years.  GERD previously treated with PPI. Stopped on her own due to concerns for alzheimers.  Alternating diarrhea and constipation.  Notes a history of hemorrhoids.  Recent onset of fecal soiling. Increase in urgency. Associated urinary incontinence. Has seen Urology in the past. Wears  Poise pads.   Multiple abdominal hernias. Previously repaired at [facility] in [place]. Infected mesh required 18 months of antibiotics in 2014. Her goal is to lose 100 pounds. Will have hernia repair when she achieves this goal.   She would like to have a colonoscopy.  She understands with her history of endometrial cancer that her gynecologist would like her to have a colonoscopy every 5 years.  Prior endoscopic history:  Normal colonoscopy except for internal hemorrhoids 04/20/2002 [facility] P[facility]hes) Normal screening colonoscopy 03/28/13 [facility] Perches)  Mother and father with colon polyps in their 73s. No other known family history of colon cancer or polyps. No family history of uterine/endometrial cancer, pancreatic cancer or gastric/stomach cancer.   Past Medical History:  Diagnosis Date  . Abdominal pain   . Acid reflux   . Cataract   . Constipation   . Diabetes mellitus   . Diarrhea   . Family history of adverse reaction to anesthesia   . Family history of DVT   . Hemorrhoids   . Hyperlipidemia   . Hypertension   . Joint pain   . Obesity   . Personal history of DVT (deep vein thrombosis)    arm  . Sinus complaint   . Sleep apnea    cpap  . Thyroid cancer ([place])   . Thyroid disease   . Uterine cancer ([place])   . Wears glasses     Past Surgical History:  Procedure Laterality Date  . ABDOMINAL HYSTERECTOMY    . APPENDECTOMY    . caterac surgery Bilateral   . CESAREAN SECTION    . CHOLECYSTECTOMY    . GASTRIC RESTRICTION SURGERY  04/04/2007   lap band. removed 09/28/12  . HERNIA REPAIR    . hernia repair surgery     09/2012 with skin grafts  . TONSILLECTOMY AND ADENOIDECTOMY      Current Facility-Administered Medications  Medication Dose Route Frequency Provider Last Rate Last Admin  . 0.9 %  sodium chloride infusion   Intravenous Continuous Thornton Park, MD      . lactated ringers infusion   Intravenous Continuous Thornton Park, MD        Allergies as of 08/09/2019 -  Review Complete 04/10/2019  Allergen Reaction Noted  . Oxaprozin Other (See Comments)   . Ampicillin Itching and Rash   . Codeine Itching and Rash   . Scallops [shellfish allergy] Hives and Itching 03/14/2013    Family History  Problem Relation Age of Onset  . Heart disease Mother   . COPD Mother   . Colon polyps Mother   . Cardiomyopathy Mother   . Prostate cancer Father   . Colon polyps Father   . Factor V Leiden deficiency Father   . Pulmonary embolism Father   . Heart disease Father   . Diabetes Sister   . Asthma Sister   . Fibromyalgia Sister   . Factor V Leiden deficiency Sister   . Liver disease Sister   . Kidney disease Sister   . Colon cancer Neg Hx   . Pancreatic cancer Neg Hx   . Stomach cancer Neg Hx   . Esophageal cancer Neg Hx   . Rectal cancer Neg Hx     Social History   Socioeconomic History  . Marital status: Married    Spouse name: Not on file  . Number of children: 1  . Years of education: Not on file  . Highest education level: Not on file  Occupational History  . Occupation: Retired Software engineer  Tobacco Use  . Smoking status: Never Smoker  . Smokeless tobacco: Never Used  Vaping Use  . Vaping Use: Never used  Substance and Sexual Activity  . Alcohol use: No  . Drug use: No  . Sexual activity: Not on file  Other Topics Concern  . Not on file  Social History Narrative  . Not on file   Social Determinants of Health   Financial Resource Strain:   . Difficulty of Paying Living Expenses:   Food Insecurity:   . Worried About Charity fundraiser in the Last Year:   . Arboriculturist in the Last Year:   Transportation Needs:   . Film/video editor (Medical):   Marland Kitchen Lack of Transportation (Non-Medical):   Physical Activity:   . Days of Exercise per Week:   . Minutes of Exercise per Session:   Stress:   . Feeling of Stress :   Social Connections:   . Frequency of Communication with Friends and Family:   . Frequency of Social  Gatherings with Friends and Family:   . Attends Religious Services:   . Active Member of Clubs or Organizations:   . Attends Archivist Meetings:   Marland Kitchen Marital Status:   Intimate Partner Violence:   . Fear of Current or Ex-Partner:   . Emotionally Abused:   Marland Kitchen Physically Abused:   . Sexually Abused:      Physical Exam: General:   Alert,  well-nourished, pleasant and cooperative in NAD Head:  Normocephalic and atraumatic. Eyes:  Sclera clear, no icterus.   Conjunctiva pink. Ears:  Normal auditory acuity. Nose:  No deformity,  discharge,  or lesions. Mouth:  No deformity or lesions.   Neck:  Supple; no masses or thyromegaly. Lungs:  Clear throughout to auscultation.   No wheezes. Heart:  Regular rate and rhythm; no murmurs. Abdomen:  Soft, central obesity, large abdominal wall scar with associated hernia, nontender, nondistended, normal bowel sounds, no rebound or guarding. No hepatosplenomegaly.   Rectal:  Deferred  Msk:  Symmetrical. No boney deformities LAD: No inguinal or umbilical LAD Extremities:  No clubbing or edema. Neurologic:  Alert and  oriented x4;  grossly nonfocal Skin:  Intact without significant lesions or rashes. Psych:  Alert and cooperative. Normal mood and affect.    Wylie Coon L. Tarri Glenn, MD, MPH 10/15/2019, 7:40 AM

## 2019-10-15 NOTE — Anesthesia Preprocedure Evaluation (Signed)
Anesthesia Evaluation  Patient identified by MRN, date of birth, ID band Patient awake    Reviewed: Allergy & Precautions, NPO status , Patient's Chart, lab work & pertinent test results  History of Anesthesia Complications (+) Family history of anesthesia reaction  Airway Mallampati: III       Dental no notable dental hx.    Pulmonary sleep apnea and Continuous Positive Airway Pressure Ventilation ,    Pulmonary exam normal        Cardiovascular hypertension, Pt. on medications Normal cardiovascular exam     Neuro/Psych    GI/Hepatic GERD  ,  Endo/Other  diabetes, Type 2, Insulin Dependent, Oral Hypoglycemic AgentsHypothyroidism   Renal/GU   negative genitourinary   Musculoskeletal   Abdominal (+) + obese,   Peds  Hematology   Anesthesia Other Findings   Reproductive/Obstetrics negative OB ROS                             Anesthesia Physical Anesthesia Plan  ASA: III  Anesthesia Plan: MAC   Post-op Pain Management:    Induction:   PONV Risk Score and Plan:   Airway Management Planned: Natural Airway and Simple Face Mask  Additional Equipment: None  Intra-op Plan:   Post-operative Plan:   Informed Consent: I have reviewed the patients History and Physical, chart, labs and discussed the procedure including the risks, benefits and alternatives for the proposed anesthesia with the patient or authorized representative who has indicated his/her understanding and acceptance.       Plan Discussed with: CRNA  Anesthesia Plan Comments:         Anesthesia Quick Evaluation

## 2019-10-15 NOTE — Telephone Encounter (Signed)
Script called to [employer], pt scheduled to see Dr. Tarri Glenn 12/14/19@1 :30pm.

## 2019-10-16 ENCOUNTER — Encounter (HOSPITAL_COMMUNITY): Payer: Self-pay | Admitting: Gastroenterology

## 2019-10-17 ENCOUNTER — Ambulatory Visit: Payer: Medicare Other

## 2019-10-19 ENCOUNTER — Other Ambulatory Visit (HOSPITAL_COMMUNITY): Payer: Self-pay | Admitting: Endocrinology

## 2019-10-19 MED FILL — POLY-IRON 150 MG CAPSULE: 150 | 30 days supply | Qty: 30 | Fill #0

## 2019-10-19 MED FILL — UNIFINE PENTIPS 8MM 31G: 31G X 8 MM | 90 days supply | Qty: 300 | Fill #1

## 2019-10-19 MED FILL — NOVOLOG FLEXPEN SYRINGE: 100 | 30 days supply | Qty: 33 | Fill #11

## 2019-10-19 MED FILL — SYNJARDY XR 10-1000 MG TB24: 10-1000 | 30 days supply | Qty: 30 | Fill #5

## 2019-10-19 MED FILL — VALSARTAN-HCTZ 320-25 MG TA: 320-25 | 90 days supply | Qty: 90 | Fill #2

## 2019-10-19 MED FILL — VIT D2 1.25 MG (50,000 UNIT: 1.25 MG | 84 days supply | Qty: 12 | Fill #1

## 2019-10-19 MED FILL — LEVOTHYROXINE SODIUM 200 MC: 200 | 90 days supply | Qty: 90 | Fill #0

## 2019-10-19 MED FILL — TRESIBA FLEXTOUCH 200 UNITS: 200 | 30 days supply | Qty: 18 | Fill #2

## 2019-10-19 MED FILL — DULoxetine HCL 60 MG CPEP: 60 | 30 days supply | Qty: 30 | Fill #4

## 2019-11-12 DIAGNOSIS — I10 Essential (primary) hypertension: Secondary | ICD-10-CM | POA: Diagnosis not present

## 2019-11-12 DIAGNOSIS — E119 Type 2 diabetes mellitus without complications: Secondary | ICD-10-CM | POA: Diagnosis not present

## 2019-11-12 DIAGNOSIS — G4733 Obstructive sleep apnea (adult) (pediatric): Secondary | ICD-10-CM | POA: Diagnosis not present

## 2019-11-26 ENCOUNTER — Other Ambulatory Visit (HOSPITAL_COMMUNITY): Payer: Self-pay | Admitting: Endocrinology

## 2019-11-26 MED FILL — POLY-IRON 150 MG CAPSULE: 150 | 30 days supply | Qty: 30 | Fill #1

## 2019-11-26 MED FILL — NOVOLOG FLEXPEN SYRINGE: 100 | 30 days supply | Qty: 33 | Fill #0

## 2019-11-26 MED FILL — SYNJARDY XR 10-1000 MG TB24: 10-1000 | 30 days supply | Qty: 30 | Fill #6

## 2019-11-26 MED FILL — TRESIBA FLEXTOUCH 200 UNITS: 200 | 30 days supply | Qty: 18 | Fill #3

## 2019-11-26 MED FILL — DULoxetine HCL 60 MG CPEP: 60 | 30 days supply | Qty: 30 | Fill #5

## 2019-11-29 DIAGNOSIS — H9113 Presbycusis, bilateral: Secondary | ICD-10-CM | POA: Diagnosis not present

## 2019-11-29 DIAGNOSIS — E041 Nontoxic single thyroid nodule: Secondary | ICD-10-CM | POA: Diagnosis not present

## 2019-11-29 DIAGNOSIS — L299 Pruritus, unspecified: Secondary | ICD-10-CM | POA: Diagnosis not present

## 2019-11-29 DIAGNOSIS — H903 Sensorineural hearing loss, bilateral: Secondary | ICD-10-CM | POA: Diagnosis not present

## 2019-11-29 MED FILL — MOMETASONE FUROATE 0.1% SOL: 0.1 | 30 days supply | Qty: 30 | Fill #0

## 2019-12-14 ENCOUNTER — Ambulatory Visit: Payer: Medicare Other | Admitting: Gastroenterology

## 2019-12-19 ENCOUNTER — Ambulatory Visit (INDEPENDENT_AMBULATORY_CARE_PROVIDER_SITE_OTHER): Payer: Medicare Other | Admitting: Gastroenterology

## 2019-12-19 ENCOUNTER — Encounter: Payer: Self-pay | Admitting: Gastroenterology

## 2019-12-19 VITALS — BP 110/60 | HR 94 | Ht 67.0 in | Wt 371.0 lb

## 2019-12-19 DIAGNOSIS — K602 Anal fissure, unspecified: Secondary | ICD-10-CM | POA: Diagnosis not present

## 2019-12-19 NOTE — Patient Instructions (Addendum)
High fiber diet recommended. Please drink at least 1.5-2 liters of water every day.  Continue to take your probiotics every day.   Using stool bulking agents may also help straining, which can help prevent fissure recurrence.   Please let me know if you have any recurrent symptoms related to your fissure as they can recur.   I appreciate the  opportunity to care for you  Thank You   Santo Held

## 2019-12-19 NOTE — Progress Notes (Signed)
Referring Provider: Reynold Bowen, MD Primary Care Physician:  Reynold Bowen, MD  Chief complaint:  Anal fissure follow-up   IMPRESSION:  Posterior anal fissure 10/15/19 Endometrial cancer diagnosed in 2012 Family history of colon polyps (mother and father) BMI 58.43 Prior colon cancer screening:    - Normal colonoscopy except for internal hemorrhoids 04/20/2002 [facility])    - Normal screening colonoscopy 03/28/13 [facility] Perches)    - Normal colonoscopy 10/15/19 except for hemorrhoids and posterior anal fissure  Anal fissure: Evidence for healing on rectal exam today.   PLAN: Follow a high fiber diet, drink at least 64 ounces of water daily Continue to use daily probiotics Colonoscopy in 5 years given the family history  Please see the "Patient Instructions" section for addition details about the plan.  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    She has had multiple gastrointestinal symptoms over the years.  GERD previously treated with PPI. Stopped on her own due to concerns for alzheimers.  Alternating diarrhea and constipation.  History of hemorrhoids.  History of fecal soiling. Increase in urgency. Associated urinary incontinence. Has seen Urology in the past. Wears Poise pads.   Colonoscopy 10/15/19 revealed a posterior anal fissure. No polyps. She returns in follow-up after treatment with Nitroglycerin. I also recommended high fiber diet, increased daily water, and daily psullium.    She is taking a prebiotic and probiotic for leaky gut syndrome Biocomplete 3. Having one bowel movement daily.  Didn't find Benefiber to be much better for more money.   No longer having pain with defecation. No rectal bleeding.    She has recently discussed concerns with Dr. Forde Dandy about angioedema. Finds her hands swell. Has associated palpitations. Will readdress her concerns during an  upcoming appointment with him.   Prior endoscopic history:  Normal colonoscopy except for internal hemorrhoids 04/20/2002 [facility]) Normal screening colonoscopy 03/28/13 [facility] P[facility]) Normal colonoscopy except for hemorrhoids and posterior anal fissure 10/15/19  Mother and father with colon polyps in their 9s. No other known family history of colon cancer or polyps. No family history of uterine/endometrial cancer, pancreatic cancer or gastric/stomach cancer.   Past Medical History:  Diagnosis Date  . Abdominal pain   . Acid reflux   . Cataract   . Constipation   . Diabetes mellitus   . Diarrhea   . Family history of adverse reaction to anesthesia   . Family history of DVT   . Hemorrhoids   . Hyperlipidemia   . Hypertension   . Joint pain   . Obesity   . Personal history of DVT (deep vein thrombosis)    arm  . Sinus complaint   . Sleep apnea    cpap  . Thyroid cancer ([place])   . Thyroid disease   . Uterine cancer ([place])   . Wears glasses     Past Surgical History:  Procedure Laterality Date  . ABDOMINAL HYSTERECTOMY    . APPENDECTOMY    . caterac surgery Bilateral   . CESAREAN SECTION    . CHOLECYSTECTOMY    . COLONOSCOPY WITH PROPOFOL N/A 10/15/2019   Procedure: COLONOSCOPY WITH PROPOFOL;  Surgeon: Thornton Park, MD;  Location: [facility];  Service: Gastroenterology;  Laterality: N/A;  . GASTRIC RESTRICTION SURGERY  04/04/2007   lap band. removed 09/28/12  . HERNIA REPAIR    . hernia repair surgery     09/2012 with skin grafts  .  TONSILLECTOMY AND ADENOIDECTOMY      Current Outpatient Medications  Medication Sig Dispense Refill  . aspirin 81 MG tablet Take 81 mg by mouth daily.      Marland Kitchen atorvastatin (LIPITOR) 10 MG tablet Take 10 mg by mouth daily.    Marland Kitchen docusate sodium (COLACE) 100 MG capsule Take 100 mg by mouth daily.    . DULoxetine (CYMBALTA) 60 MG capsule Take 60 mg by mouth daily.    . Empagliflozin-Metformin HCl ER (SYNJARDY XR) 01-999 MG TB24 Take 1 tablet  by mouth daily.    . ergocalciferol (VITAMIN D2) 1.25 MG (50000 UT) capsule Take 50,000 Units by mouth once a week.    . furosemide (LASIX) 40 MG tablet Take 40 mg by mouth daily as needed for fluid.     Marland Kitchen levothyroxine (SYNTHROID, LEVOTHROID) 200 MCG tablet Take 200 mcg by mouth daily before breakfast.     . Multiple Vitamin (MULTIVITAMIN) tablet Take 1 tablet by mouth daily.    Marland Kitchen NOVOLOG FLEXPEN 100 UNIT/ML FlexPen Inject 25-40 Units as directed 2 (two) times daily.   2  . ONE TOUCH ULTRA TEST test strip   6  . POLY-IRON 150 150 MG capsule Take 150 mg by mouth daily.  5  . Probiotic Product (PROBIOTIC DAILY PO) Take 1 capsule by mouth daily. Biocomplete    . TRESIBA FLEXTOUCH 200 UNIT/ML SOPN Inject 120 Units into the skin every morning.     Marland Kitchen UNIFINE PENTIPS 31G X 5 MM MISC   4  . valsartan-hydrochlorothiazide (DIOVAN HCT) 320-25 MG per tablet Take 1 tablet by mouth daily.     No current facility-administered medications for this visit.    Allergies as of 12/19/2019 - Review Complete 12/19/2019  Allergen Reaction Noted  . Oxaprozin Other (See Comments)   . Ampicillin Itching and Rash   . Codeine Itching and Rash   . Scallops [shellfish allergy] Hives and Itching 03/14/2013    Family History  Problem Relation Age of Onset  . Heart disease Mother   . COPD Mother   . Colon polyps Mother   . Cardiomyopathy Mother   . Prostate cancer Father   . Colon polyps Father   . Factor V Leiden deficiency Father   . Pulmonary embolism Father   . Heart disease Father   . Diabetes Sister   . Asthma Sister   . Fibromyalgia Sister   . Factor V Leiden deficiency Sister   . Liver disease Sister   . Kidney disease Sister   . Colon cancer Neg Hx   . Pancreatic cancer Neg Hx   . Stomach cancer Neg Hx   . Esophageal cancer Neg Hx   . Rectal cancer Neg Hx     Social History   Socioeconomic History  . Marital status: Married    Spouse name: Not on file  . Number of children: 1  . Years  of education: Not on file  . Highest education level: Not on file  Occupational History  . Occupation: Retired Software engineer  Tobacco Use  . Smoking status: Never Smoker  . Smokeless tobacco: Never Used  Vaping Use  . Vaping Use: Never used  Substance and Sexual Activity  . Alcohol use: No  . Drug use: No  . Sexual activity: Not on file  Other Topics Concern  . Not on file  Social History Narrative  . Not on file   Social Determinants of Health   Financial Resource Strain:   . Difficulty of  Paying Living Expenses: Not on file  Food Insecurity:   . Worried About Charity fundraiser in the Last Year: Not on file  . Ran Out of Food in the Last Year: Not on file  Transportation Needs:   . Lack of Transportation (Medical): Not on file  . Lack of Transportation (Non-Medical): Not on file  Physical Activity:   . Days of Exercise per Week: Not on file  . Minutes of Exercise per Session: Not on file  Stress:   . Feeling of Stress : Not on file  Social Connections:   . Frequency of Communication with Friends and Family: Not on file  . Frequency of Social Gatherings with Friends and Family: Not on file  . Attends Religious Services: Not on file  . Active Member of Clubs or Organizations: Not on file  . Attends Archivist Meetings: Not on file  . Marital Status: Not on file  Intimate Partner Violence:   . Fear of Current or Ex-Partner: Not on file  . Emotionally Abused: Not on file  . Physically Abused: Not on file  . Sexually Abused: Not on file     Physical Exam: General:   Alert,  well-nourished, pleasant and cooperative in NAD Head:  Normocephalic and atraumatic. Eyes:  Sclera clear, no icterus.   Conjunctiva pink. Rectal:   No chemical dermatitis. Healed posterior fissure. No external hemorrhoids or fistula. No prolapsing hemorrhoids. No rectal prolapse. Normal anocutaneous reflex. No stool in the rectal vault. No mass or fecal impaction. Normal anal resting tone.  Chaperone: June Extremities:  No clubbing or edema. Neurologic:  Alert and  oriented x4;  grossly nonfocal Skin:  Intact without significant lesions or rashes. Psych:  Alert and cooperative. Normal mood and affect.    Twisha Vanpelt L. Tarri Glenn, MD, MPH 12/19/2019, 10:43 AM

## 2019-12-20 ENCOUNTER — Encounter: Payer: Self-pay | Admitting: Gastroenterology

## 2019-12-20 DIAGNOSIS — Z1231 Encounter for screening mammogram for malignant neoplasm of breast: Secondary | ICD-10-CM | POA: Diagnosis not present

## 2019-12-20 DIAGNOSIS — Z13820 Encounter for screening for osteoporosis: Secondary | ICD-10-CM | POA: Diagnosis not present

## 2019-12-24 ENCOUNTER — Other Ambulatory Visit (HOSPITAL_COMMUNITY): Payer: Self-pay | Admitting: Endocrinology

## 2019-12-24 MED FILL — TRESIBA FLEXTOUCH 200 UNITS: 200 | 30 days supply | Qty: 18 | Fill #4

## 2019-12-24 MED FILL — ATORVASTATIN CALCIUM 10 MG: 10 | 90 days supply | Qty: 90 | Fill #0

## 2019-12-24 MED FILL — NOVOLOG FLEXPEN SYRINGE: 100 | 30 days supply | Qty: 33 | Fill #1

## 2019-12-24 MED FILL — DULoxetine HCL 60 MG CPEP: 60 | 30 days supply | Qty: 30 | Fill #0

## 2019-12-24 MED FILL — POLY-IRON 150 MG CAPSULE: 150 | 30 days supply | Qty: 30 | Fill #2

## 2019-12-24 MED FILL — SYNJARDY XR 10-1000 MG TB24: 10-1000 | 30 days supply | Qty: 30 | Fill #7

## 2020-01-14 DIAGNOSIS — E0844 Diabetes mellitus due to underlying condition with diabetic amyotrophy: Secondary | ICD-10-CM | POA: Diagnosis not present

## 2020-01-14 DIAGNOSIS — R948 Abnormal results of function studies of other organs and systems: Secondary | ICD-10-CM | POA: Diagnosis not present

## 2020-01-14 DIAGNOSIS — R635 Abnormal weight gain: Secondary | ICD-10-CM | POA: Diagnosis not present

## 2020-01-17 DIAGNOSIS — E119 Type 2 diabetes mellitus without complications: Secondary | ICD-10-CM | POA: Diagnosis not present

## 2020-01-17 DIAGNOSIS — E785 Hyperlipidemia, unspecified: Secondary | ICD-10-CM | POA: Diagnosis not present

## 2020-01-17 DIAGNOSIS — D751 Secondary polycythemia: Secondary | ICD-10-CM | POA: Diagnosis not present

## 2020-01-17 DIAGNOSIS — Z794 Long term (current) use of insulin: Secondary | ICD-10-CM | POA: Diagnosis not present

## 2020-01-17 DIAGNOSIS — Z9989 Dependence on other enabling machines and devices: Secondary | ICD-10-CM | POA: Diagnosis not present

## 2020-01-17 DIAGNOSIS — G4733 Obstructive sleep apnea (adult) (pediatric): Secondary | ICD-10-CM | POA: Diagnosis not present

## 2020-01-17 DIAGNOSIS — M5489 Other dorsalgia: Secondary | ICD-10-CM | POA: Diagnosis not present

## 2020-01-17 DIAGNOSIS — G8929 Other chronic pain: Secondary | ICD-10-CM | POA: Diagnosis not present

## 2020-01-17 DIAGNOSIS — I10 Essential (primary) hypertension: Secondary | ICD-10-CM | POA: Diagnosis not present

## 2020-01-17 DIAGNOSIS — I491 Atrial premature depolarization: Secondary | ICD-10-CM | POA: Diagnosis not present

## 2020-01-17 DIAGNOSIS — I4510 Unspecified right bundle-branch block: Secondary | ICD-10-CM | POA: Diagnosis not present

## 2020-01-17 DIAGNOSIS — Z6841 Body Mass Index (BMI) 40.0 and over, adult: Secondary | ICD-10-CM | POA: Diagnosis not present

## 2020-01-17 DIAGNOSIS — M8580 Other specified disorders of bone density and structure, unspecified site: Secondary | ICD-10-CM | POA: Diagnosis not present

## 2020-01-17 MED FILL — BUPROPION HCL 75 MG TABS: 75 | 30 days supply | Qty: 60 | Fill #0

## 2020-01-21 ENCOUNTER — Other Ambulatory Visit (HOSPITAL_COMMUNITY): Payer: Self-pay | Admitting: Endocrinology

## 2020-01-21 MED FILL — VALSARTAN-HCTZ 320-25 MG TA: 320-25 | 90 days supply | Qty: 90 | Fill #3

## 2020-01-21 MED FILL — LEVOTHYROXINE SODIUM 200 MC: 200 | 90 days supply | Qty: 90 | Fill #1

## 2020-01-21 MED FILL — TRESIBA FLEXTOUCH 200 UNITS: 200 | 30 days supply | Qty: 18 | Fill #5

## 2020-01-21 MED FILL — DULoxetine HCL 60 MG CPEP: 60 | 30 days supply | Qty: 30 | Fill #1

## 2020-01-21 MED FILL — UNIFINE PENTIPS 8MM 31G: 31G X 8 MM | 90 days supply | Qty: 300 | Fill #0

## 2020-01-21 MED FILL — SYNJARDY XR 10-1000 MG TB24: 10-1000 | 30 days supply | Qty: 30 | Fill #8

## 2020-01-21 MED FILL — NOVOLOG FLEXPEN SYRINGE: 100 | 30 days supply | Qty: 33 | Fill #2

## 2020-01-21 MED FILL — VIT D2 1.25 MG (50,000 UNIT: 1.25 MG | 84 days supply | Qty: 12 | Fill #2

## 2020-01-21 MED FILL — FERREX 150 CAPSULE: 150 | 30 days supply | Qty: 30 | Fill #3

## 2020-01-31 DIAGNOSIS — M546 Pain in thoracic spine: Secondary | ICD-10-CM | POA: Diagnosis not present

## 2020-01-31 DIAGNOSIS — I10 Essential (primary) hypertension: Secondary | ICD-10-CM | POA: Diagnosis not present

## 2020-01-31 DIAGNOSIS — E559 Vitamin D deficiency, unspecified: Secondary | ICD-10-CM | POA: Diagnosis not present

## 2020-01-31 DIAGNOSIS — I129 Hypertensive chronic kidney disease with stage 1 through stage 4 chronic kidney disease, or unspecified chronic kidney disease: Secondary | ICD-10-CM | POA: Diagnosis not present

## 2020-01-31 DIAGNOSIS — K469 Unspecified abdominal hernia without obstruction or gangrene: Secondary | ICD-10-CM | POA: Diagnosis not present

## 2020-01-31 DIAGNOSIS — T8149XA Infection following a procedure, other surgical site, initial encounter: Secondary | ICD-10-CM | POA: Diagnosis not present

## 2020-01-31 DIAGNOSIS — C541 Malignant neoplasm of endometrium: Secondary | ICD-10-CM | POA: Diagnosis not present

## 2020-01-31 DIAGNOSIS — E039 Hypothyroidism, unspecified: Secondary | ICD-10-CM | POA: Diagnosis not present

## 2020-01-31 DIAGNOSIS — E042 Nontoxic multinodular goiter: Secondary | ICD-10-CM | POA: Diagnosis not present

## 2020-01-31 DIAGNOSIS — N1831 Chronic kidney disease, stage 3a: Secondary | ICD-10-CM | POA: Diagnosis not present

## 2020-01-31 DIAGNOSIS — E1142 Type 2 diabetes mellitus with diabetic polyneuropathy: Secondary | ICD-10-CM | POA: Diagnosis not present

## 2020-02-01 DIAGNOSIS — Z6841 Body Mass Index (BMI) 40.0 and over, adult: Secondary | ICD-10-CM | POA: Diagnosis not present

## 2020-02-01 DIAGNOSIS — E669 Obesity, unspecified: Secondary | ICD-10-CM | POA: Diagnosis not present

## 2020-02-04 DIAGNOSIS — Z6841 Body Mass Index (BMI) 40.0 and over, adult: Secondary | ICD-10-CM | POA: Diagnosis not present

## 2020-02-06 ENCOUNTER — Other Ambulatory Visit (HOSPITAL_COMMUNITY): Payer: Self-pay | Admitting: Orthopedic Surgery

## 2020-02-06 DIAGNOSIS — M6701 Short Achilles tendon (acquired), right ankle: Secondary | ICD-10-CM | POA: Diagnosis not present

## 2020-02-06 DIAGNOSIS — M76821 Posterior tibial tendinitis, right leg: Secondary | ICD-10-CM | POA: Diagnosis not present

## 2020-02-06 DIAGNOSIS — M25571 Pain in right ankle and joints of right foot: Secondary | ICD-10-CM | POA: Diagnosis not present

## 2020-02-06 DIAGNOSIS — M79671 Pain in right foot: Secondary | ICD-10-CM | POA: Diagnosis not present

## 2020-02-06 MED FILL — DICLOFENAC SODIUM 1 % GEL: 1 | 12 days supply | Qty: 100 | Fill #0

## 2020-02-11 NOTE — Progress Notes (Signed)
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 FVC 2.65/76%, FEV1 1.96/74%, ratio 0.74, FEF 25-75 percent 1.44/63%. NPSG 02/13/01 AHI 26/hr  ----------------------------------------------------------------------------------------y.   02/11/2019-  69 year old female never smoker, retired Software engineer, followed for OSA, located by morbid obesity, DM 2, HBP, Hx Endometrial Cancer,  CPAP auto 10-20/[facility] -----1 year f/u OSA  Body weight today 376 lbs Download compliance 97%, AHI 0.2/ hr Very satisfied with CPAP. Feels [facility] calls too often to replace supplies. Ate a lot of salty crackers yesterday. Feels she retained fluid. Noted dyspnea walking in here today from car, w/o wheeze/ cough. L ear uncomfortable, R ear itching. Concerned she may be developing ear infection.  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 Very happy with her CPAP. EKG mildly abnormal with PACs and long QT so she has been referred to Cardiology, Had CXR at ? [facility]- she understands it was ok. On weight loss program using Contrave.  ROS-see HPI     + = positive Constitutional:    weight loss, No- night sweats, fevers, chills, fatigue, lassitude. HEENT:   No-  headaches, difficulty swallowing, tooth/dental problems, sore throat,       No-  sneezing, itching, +ear ache, nasal congestion, +post nasal drip,  CV:  No-   chest pain, orthopnea, PND, swelling in lower extremities, anasarca,  dizziness, palpitations Resp: + shortness of breath with exertion or at rest.              No-   productive cough,  No non-productive cough,  No- coughing up of  blood.              No-   change in color of mucus.  No- wheezing.   Skin: No-   rash or lesions. GI:  No-   heartburn, indigestion, abdominal pain, nausea, vomiting,  GU: MS:  No-   joint pain or swelling.   Neuro-     nothing unusual Psych:  No- change in mood or affect. No depression or anxiety.  No memory loss.  OBJ- Physical Exam General- Alert, Oriented, Affect-appropriate, Distress- none acute, + morbid obesity, . Skin- rash-none, lesions- none, excoriation- none Lymphadenopathy- none Head- atraumatic            Eyes- Gross vision intact, PERRLA, conjunctivae and secretions clear            Ears- + L TM retracted            Nose- Clear, no-Septal dev, No-polyps, erosion, perforation             Throat- Mallampati IV , mucosa clear , drainage- none, tonsils- atrophic Neck- flexible , trachea midline, no stridor , thyroid nl, carotid no bruit Chest - symmetrical excursion , unlabored           Heart/CV- RRR , 1/6 AS murmur , no gallop  , no rub, nl s1 s2                           - JVD- none , edema- none, stasis changes- none, varices- none           Lung- clear  to P&A, wheeze- none, cough- none , dullness-none, rub- none           Chest wall-  Abd-  Br/ Gen/ Rectal- Not done, not indicated Extrem- cyanosis- none, clubbing, none, atrophy- none, strength- nl Neuro- grossly intact to observation

## 2020-02-12 ENCOUNTER — Encounter: Payer: Self-pay | Admitting: Internal Medicine

## 2020-02-12 ENCOUNTER — Ambulatory Visit (INDEPENDENT_AMBULATORY_CARE_PROVIDER_SITE_OTHER): Payer: Medicare Other | Admitting: Internal Medicine

## 2020-02-12 ENCOUNTER — Other Ambulatory Visit: Payer: Self-pay

## 2020-02-12 ENCOUNTER — Other Ambulatory Visit (HOSPITAL_COMMUNITY): Payer: Self-pay | Admitting: Orthopedic Surgery

## 2020-02-12 DIAGNOSIS — G4733 Obstructive sleep apnea (adult) (pediatric): Secondary | ICD-10-CM

## 2020-02-12 DIAGNOSIS — Z23 Encounter for immunization: Secondary | ICD-10-CM | POA: Diagnosis not present

## 2020-02-12 NOTE — Assessment & Plan Note (Signed)
Benefits and comfortable with CPAP. Good compliance and control Plan- Continue auto 10-20

## 2020-02-12 NOTE — Assessment & Plan Note (Addendum)
Failed lap-band. Now in supervised program with Contrave for appetite supression.

## 2020-02-12 NOTE — Patient Instructions (Signed)
We can continue CPAP auto 10-20  Order- flu vax senior  Please call if we can help

## 2020-02-15 DIAGNOSIS — Z713 Dietary counseling and surveillance: Secondary | ICD-10-CM | POA: Diagnosis not present

## 2020-02-15 DIAGNOSIS — Z6841 Body Mass Index (BMI) 40.0 and over, adult: Secondary | ICD-10-CM | POA: Diagnosis not present

## 2020-02-19 DIAGNOSIS — I4510 Unspecified right bundle-branch block: Secondary | ICD-10-CM | POA: Diagnosis not present

## 2020-02-19 DIAGNOSIS — I444 Left anterior fascicular block: Secondary | ICD-10-CM | POA: Diagnosis not present

## 2020-02-19 DIAGNOSIS — I10 Essential (primary) hypertension: Secondary | ICD-10-CM | POA: Diagnosis not present

## 2020-02-19 DIAGNOSIS — M25571 Pain in right ankle and joints of right foot: Secondary | ICD-10-CM | POA: Diagnosis not present

## 2020-02-20 DIAGNOSIS — E669 Obesity, unspecified: Secondary | ICD-10-CM | POA: Diagnosis not present

## 2020-02-20 DIAGNOSIS — Z6841 Body Mass Index (BMI) 40.0 and over, adult: Secondary | ICD-10-CM | POA: Diagnosis not present

## 2020-02-22 MED FILL — DULoxetine HCL 60 MG CPEP: 60 | 30 days supply | Qty: 30 | Fill #2

## 2020-02-22 MED FILL — SYNJARDY XR 10-1000 MG TB24: 10-1000 | 30 days supply | Qty: 30 | Fill #9

## 2020-02-26 DIAGNOSIS — M25571 Pain in right ankle and joints of right foot: Secondary | ICD-10-CM | POA: Diagnosis not present

## 2020-03-03 DIAGNOSIS — M25571 Pain in right ankle and joints of right foot: Secondary | ICD-10-CM | POA: Diagnosis not present

## 2020-03-05 DIAGNOSIS — I517 Cardiomegaly: Secondary | ICD-10-CM | POA: Diagnosis not present

## 2020-03-05 DIAGNOSIS — I348 Other nonrheumatic mitral valve disorders: Secondary | ICD-10-CM | POA: Diagnosis not present

## 2020-03-07 DIAGNOSIS — M25571 Pain in right ankle and joints of right foot: Secondary | ICD-10-CM | POA: Diagnosis not present

## 2020-03-10 DIAGNOSIS — E669 Obesity, unspecified: Secondary | ICD-10-CM | POA: Diagnosis not present

## 2020-03-10 DIAGNOSIS — Z6841 Body Mass Index (BMI) 40.0 and over, adult: Secondary | ICD-10-CM | POA: Diagnosis not present

## 2020-03-11 ENCOUNTER — Other Ambulatory Visit (HOSPITAL_COMMUNITY): Payer: Self-pay | Admitting: Endocrinology

## 2020-03-11 DIAGNOSIS — M25571 Pain in right ankle and joints of right foot: Secondary | ICD-10-CM | POA: Diagnosis not present

## 2020-03-11 MED FILL — FERREX 150 CAPSULE: 150 | 30 days supply | Qty: 30 | Fill #4

## 2020-03-11 MED FILL — NOVOLOG FLEXPEN SYRINGE: 100 | 30 days supply | Qty: 33 | Fill #3

## 2020-03-11 MED FILL — TRESIBA FLEXTOUCH 200 UNITS: 200 | 30 days supply | Qty: 18 | Fill #6

## 2020-03-13 DIAGNOSIS — M25571 Pain in right ankle and joints of right foot: Secondary | ICD-10-CM | POA: Diagnosis not present

## 2020-03-17 DIAGNOSIS — M25571 Pain in right ankle and joints of right foot: Secondary | ICD-10-CM | POA: Diagnosis not present

## 2020-03-19 DIAGNOSIS — L570 Actinic keratosis: Secondary | ICD-10-CM | POA: Diagnosis not present

## 2020-03-19 DIAGNOSIS — L720 Epidermal cyst: Secondary | ICD-10-CM | POA: Diagnosis not present

## 2020-03-19 DIAGNOSIS — L738 Other specified follicular disorders: Secondary | ICD-10-CM | POA: Diagnosis not present

## 2020-03-19 DIAGNOSIS — L821 Other seborrheic keratosis: Secondary | ICD-10-CM | POA: Diagnosis not present

## 2020-03-20 DIAGNOSIS — M25571 Pain in right ankle and joints of right foot: Secondary | ICD-10-CM | POA: Diagnosis not present

## 2020-03-24 DIAGNOSIS — E669 Obesity, unspecified: Secondary | ICD-10-CM | POA: Diagnosis not present

## 2020-03-24 DIAGNOSIS — Z6841 Body Mass Index (BMI) 40.0 and over, adult: Secondary | ICD-10-CM | POA: Diagnosis not present

## 2020-03-24 MED FILL — DULoxetine HCL 30 MG CPEP: 30 | 30 days supply | Qty: 30 | Fill #0

## 2020-03-24 MED FILL — buPROPion HCL ER (SR) 100 M: 100 | 90 days supply | Qty: 180 | Fill #0

## 2020-03-26 DIAGNOSIS — M79671 Pain in right foot: Secondary | ICD-10-CM | POA: Diagnosis not present

## 2020-03-26 MED FILL — ATORVASTATIN CALCIUM 10 MG: 10 | 90 days supply | Qty: 90 | Fill #1

## 2020-03-26 MED FILL — SYNJARDY XR 10-1000 MG TB24: 10-1000 | 30 days supply | Qty: 30 | Fill #10

## 2020-04-01 DIAGNOSIS — M25571 Pain in right ankle and joints of right foot: Secondary | ICD-10-CM | POA: Diagnosis not present

## 2020-04-02 ENCOUNTER — Other Ambulatory Visit (HOSPITAL_COMMUNITY): Payer: Self-pay | Admitting: Endocrinology

## 2020-04-02 DIAGNOSIS — E669 Obesity, unspecified: Secondary | ICD-10-CM | POA: Diagnosis not present

## 2020-04-02 DIAGNOSIS — Z6841 Body Mass Index (BMI) 40.0 and over, adult: Secondary | ICD-10-CM | POA: Diagnosis not present

## 2020-04-02 MED FILL — LEVOTHYROXINE SODIUM 200 MC: 200 | 90 days supply | Qty: 90 | Fill #0

## 2020-04-02 MED FILL — DICLOFENAC SODIUM 1 % GEL: 1 | 13 days supply | Qty: 100 | Fill #0

## 2020-04-02 MED FILL — VALSARTAN-HCTZ 320-25 MG TA: 320-25 | 90 days supply | Qty: 90 | Fill #4

## 2020-04-02 MED FILL — UNIFINE PENTIPS 8MM 31G: 31G X 8 MM | 90 days supply | Qty: 300 | Fill #1

## 2020-04-02 MED FILL — FERREX 150 CAPSULE: 150 | 30 days supply | Qty: 30 | Fill #5

## 2020-04-03 MED FILL — NOVOLOG FLEXPEN SYRINGE: 100 | 30 days supply | Qty: 33 | Fill #0

## 2020-04-03 MED FILL — TRESIBA FLEXTOUCH 200 UNITS: 200 | 30 days supply | Qty: 18 | Fill #7

## 2020-04-08 DIAGNOSIS — M25571 Pain in right ankle and joints of right foot: Secondary | ICD-10-CM | POA: Diagnosis not present

## 2020-04-10 DIAGNOSIS — M25571 Pain in right ankle and joints of right foot: Secondary | ICD-10-CM | POA: Diagnosis not present

## 2020-04-16 ENCOUNTER — Other Ambulatory Visit (HOSPITAL_COMMUNITY): Payer: Self-pay | Admitting: Endocrinology

## 2020-04-16 MED FILL — VIT D2 1.25 MG (50,000 UNIT: 1.25 MG | 84 days supply | Qty: 12 | Fill #0

## 2020-04-16 MED FILL — DULoxetine HCL 30 MG CPEP: 30 | 30 days supply | Qty: 30 | Fill #1

## 2020-04-18 ENCOUNTER — Other Ambulatory Visit (HOSPITAL_COMMUNITY): Payer: Self-pay | Admitting: Internal Medicine

## 2020-04-18 DIAGNOSIS — Z6841 Body Mass Index (BMI) 40.0 and over, adult: Secondary | ICD-10-CM | POA: Diagnosis not present

## 2020-04-18 DIAGNOSIS — R635 Abnormal weight gain: Secondary | ICD-10-CM | POA: Diagnosis not present

## 2020-04-18 DIAGNOSIS — Z794 Long term (current) use of insulin: Secondary | ICD-10-CM | POA: Diagnosis not present

## 2020-04-18 DIAGNOSIS — I10 Essential (primary) hypertension: Secondary | ICD-10-CM | POA: Diagnosis not present

## 2020-04-18 DIAGNOSIS — E669 Obesity, unspecified: Secondary | ICD-10-CM | POA: Diagnosis not present

## 2020-04-18 DIAGNOSIS — E119 Type 2 diabetes mellitus without complications: Secondary | ICD-10-CM | POA: Diagnosis not present

## 2020-04-18 MED FILL — BUPROPION HCL SR 150 MG TAB: 150 | 90 days supply | Qty: 180 | Fill #0

## 2020-04-18 MED FILL — NALTREXONE 50 MG TABLET: 50 | 90 days supply | Qty: 45 | Fill #0

## 2020-04-24 MED FILL — SYNJARDY XR 10-1000 MG TB24: 10-1000 | 30 days supply | Qty: 30 | Fill #11

## 2020-04-30 DIAGNOSIS — Z794 Long term (current) use of insulin: Secondary | ICD-10-CM | POA: Diagnosis not present

## 2020-04-30 DIAGNOSIS — I10 Essential (primary) hypertension: Secondary | ICD-10-CM | POA: Diagnosis not present

## 2020-04-30 DIAGNOSIS — R32 Unspecified urinary incontinence: Secondary | ICD-10-CM | POA: Diagnosis not present

## 2020-04-30 DIAGNOSIS — Z124 Encounter for screening for malignant neoplasm of cervix: Secondary | ICD-10-CM | POA: Diagnosis not present

## 2020-04-30 DIAGNOSIS — Z6841 Body Mass Index (BMI) 40.0 and over, adult: Secondary | ICD-10-CM | POA: Diagnosis not present

## 2020-04-30 DIAGNOSIS — Z779 Other contact with and (suspected) exposures hazardous to health: Secondary | ICD-10-CM | POA: Diagnosis not present

## 2020-04-30 DIAGNOSIS — E119 Type 2 diabetes mellitus without complications: Secondary | ICD-10-CM | POA: Diagnosis not present

## 2020-05-06 ENCOUNTER — Other Ambulatory Visit (HOSPITAL_COMMUNITY): Payer: Self-pay | Admitting: Internal Medicine

## 2020-05-16 DIAGNOSIS — E669 Obesity, unspecified: Secondary | ICD-10-CM | POA: Diagnosis not present

## 2020-05-16 DIAGNOSIS — Z6841 Body Mass Index (BMI) 40.0 and over, adult: Secondary | ICD-10-CM | POA: Diagnosis not present

## 2020-05-22 ENCOUNTER — Other Ambulatory Visit (HOSPITAL_COMMUNITY): Payer: Self-pay | Admitting: Internal Medicine

## 2020-05-22 DIAGNOSIS — Z6841 Body Mass Index (BMI) 40.0 and over, adult: Secondary | ICD-10-CM | POA: Diagnosis not present

## 2020-05-22 DIAGNOSIS — Z794 Long term (current) use of insulin: Secondary | ICD-10-CM | POA: Diagnosis not present

## 2020-05-22 DIAGNOSIS — I10 Essential (primary) hypertension: Secondary | ICD-10-CM | POA: Diagnosis not present

## 2020-05-22 DIAGNOSIS — F32A Depression, unspecified: Secondary | ICD-10-CM | POA: Diagnosis not present

## 2020-05-22 DIAGNOSIS — E119 Type 2 diabetes mellitus without complications: Secondary | ICD-10-CM | POA: Diagnosis not present

## 2020-05-22 MED FILL — VALSARTAN-HCTZ 160-25 MG TA: 160-25 | 90 days supply | Qty: 90 | Fill #0

## 2020-05-22 MED FILL — OZEMPIC 0.25 OR 0.5 MG/DOSE: 2 | 56 days supply | Qty: 2 | Fill #0

## 2020-05-23 ENCOUNTER — Other Ambulatory Visit (HOSPITAL_COMMUNITY): Payer: Self-pay | Admitting: Endocrinology

## 2020-05-23 MED FILL — SYNJARDY XR 10-1000 MG TB24: 10-1000 | 30 days supply | Qty: 30 | Fill #0

## 2020-05-23 MED FILL — DULoxetine HCL 60 MG CPEP: 60 | 30 days supply | Qty: 30 | Fill #3

## 2020-05-23 MED FILL — FERREX 150 CAPSULE: 150 | 30 days supply | Qty: 30 | Fill #0

## 2020-05-26 DIAGNOSIS — Z6841 Body Mass Index (BMI) 40.0 and over, adult: Secondary | ICD-10-CM | POA: Diagnosis not present

## 2020-05-26 DIAGNOSIS — E669 Obesity, unspecified: Secondary | ICD-10-CM | POA: Diagnosis not present

## 2020-05-30 DIAGNOSIS — K5900 Constipation, unspecified: Secondary | ICD-10-CM | POA: Diagnosis not present

## 2020-05-30 DIAGNOSIS — Z6841 Body Mass Index (BMI) 40.0 and over, adult: Secondary | ICD-10-CM | POA: Diagnosis not present

## 2020-06-03 DIAGNOSIS — Z794 Long term (current) use of insulin: Secondary | ICD-10-CM | POA: Diagnosis not present

## 2020-06-03 DIAGNOSIS — S30851D Superficial foreign body of abdominal wall, subsequent encounter: Secondary | ICD-10-CM | POA: Diagnosis not present

## 2020-06-03 DIAGNOSIS — L089 Local infection of the skin and subcutaneous tissue, unspecified: Secondary | ICD-10-CM | POA: Diagnosis not present

## 2020-06-03 DIAGNOSIS — G4730 Sleep apnea, unspecified: Secondary | ICD-10-CM | POA: Diagnosis not present

## 2020-06-03 DIAGNOSIS — E785 Hyperlipidemia, unspecified: Secondary | ICD-10-CM | POA: Diagnosis not present

## 2020-06-03 DIAGNOSIS — K219 Gastro-esophageal reflux disease without esophagitis: Secondary | ICD-10-CM | POA: Diagnosis not present

## 2020-06-03 DIAGNOSIS — N1831 Chronic kidney disease, stage 3a: Secondary | ICD-10-CM | POA: Diagnosis not present

## 2020-06-03 DIAGNOSIS — E1142 Type 2 diabetes mellitus with diabetic polyneuropathy: Secondary | ICD-10-CM | POA: Diagnosis not present

## 2020-06-03 DIAGNOSIS — E559 Vitamin D deficiency, unspecified: Secondary | ICD-10-CM | POA: Diagnosis not present

## 2020-06-03 DIAGNOSIS — F329 Major depressive disorder, single episode, unspecified: Secondary | ICD-10-CM | POA: Diagnosis not present

## 2020-06-03 DIAGNOSIS — D649 Anemia, unspecified: Secondary | ICD-10-CM | POA: Diagnosis not present

## 2020-06-03 DIAGNOSIS — I129 Hypertensive chronic kidney disease with stage 1 through stage 4 chronic kidney disease, or unspecified chronic kidney disease: Secondary | ICD-10-CM | POA: Diagnosis not present

## 2020-06-03 DIAGNOSIS — E042 Nontoxic multinodular goiter: Secondary | ICD-10-CM | POA: Diagnosis not present

## 2020-06-03 DIAGNOSIS — K469 Unspecified abdominal hernia without obstruction or gangrene: Secondary | ICD-10-CM | POA: Diagnosis not present

## 2020-06-03 DIAGNOSIS — E039 Hypothyroidism, unspecified: Secondary | ICD-10-CM | POA: Diagnosis not present

## 2020-06-09 MED FILL — DICLOFENAC SODIUM 1 % GEL: 1 | 13 days supply | Qty: 100 | Fill #1

## 2020-06-09 MED FILL — TRESIBA FLEXTOUCH 200 UNITS: 200 | 30 days supply | Qty: 18 | Fill #8

## 2020-06-09 MED FILL — NOVOLOG FLEXPEN SYRINGE: 100 | 30 days supply | Qty: 33 | Fill #1

## 2020-06-10 DIAGNOSIS — E669 Obesity, unspecified: Secondary | ICD-10-CM | POA: Diagnosis not present

## 2020-06-10 DIAGNOSIS — Z6841 Body Mass Index (BMI) 40.0 and over, adult: Secondary | ICD-10-CM | POA: Diagnosis not present

## 2020-06-16 ENCOUNTER — Other Ambulatory Visit (HOSPITAL_COMMUNITY): Payer: Self-pay | Admitting: Obstetrics and Gynecology

## 2020-06-26 ENCOUNTER — Other Ambulatory Visit (HOSPITAL_COMMUNITY): Payer: Self-pay | Admitting: Internal Medicine

## 2020-06-26 DIAGNOSIS — Z6841 Body Mass Index (BMI) 40.0 and over, adult: Secondary | ICD-10-CM | POA: Diagnosis not present

## 2020-06-26 DIAGNOSIS — E669 Obesity, unspecified: Secondary | ICD-10-CM | POA: Diagnosis not present

## 2020-06-27 ENCOUNTER — Other Ambulatory Visit (HOSPITAL_COMMUNITY): Payer: Self-pay | Admitting: Internal Medicine

## 2020-06-27 MED FILL — SYNJARDY XR 10-1000 MG TB24: 10-1000 | 30 days supply | Qty: 30 | Fill #1

## 2020-06-27 MED FILL — ATORVASTATIN CALCIUM 10 MG: 10 | 90 days supply | Qty: 90 | Fill #2

## 2020-06-27 MED FILL — NALTREXONE 50 MG TABLET: 50 | 90 days supply | Qty: 45 | Fill #0

## 2020-06-27 MED FILL — DICLOFENAC SODIUM 1 % GEL: 1 | 13 days supply | Qty: 100 | Fill #2

## 2020-06-27 MED FILL — FERREX 150 CAPSULE: 150 | 30 days supply | Qty: 30 | Fill #1

## 2020-06-27 MED FILL — DULoxetine HCL 60 MG CPEP: 60 | 30 days supply | Qty: 30 | Fill #4

## 2020-07-02 DIAGNOSIS — Z6841 Body Mass Index (BMI) 40.0 and over, adult: Secondary | ICD-10-CM | POA: Diagnosis not present

## 2020-07-02 DIAGNOSIS — E669 Obesity, unspecified: Secondary | ICD-10-CM | POA: Diagnosis not present

## 2020-07-15 ENCOUNTER — Other Ambulatory Visit (HOSPITAL_COMMUNITY): Payer: Self-pay

## 2020-07-15 DIAGNOSIS — R82998 Other abnormal findings in urine: Secondary | ICD-10-CM | POA: Diagnosis not present

## 2020-07-15 DIAGNOSIS — R32 Unspecified urinary incontinence: Secondary | ICD-10-CM | POA: Diagnosis not present

## 2020-07-15 DIAGNOSIS — N393 Stress incontinence (female) (male): Secondary | ICD-10-CM | POA: Diagnosis not present

## 2020-07-15 DIAGNOSIS — N390 Urinary tract infection, site not specified: Secondary | ICD-10-CM | POA: Diagnosis not present

## 2020-07-15 DIAGNOSIS — R350 Frequency of micturition: Secondary | ICD-10-CM | POA: Diagnosis not present

## 2020-07-15 MED ORDER — ESTRADIOL 0.1 MG/GM VA CREA
1.0000 g | TOPICAL_CREAM | VAGINAL | 3 refills | Status: DC
  Filled 2020-07-15 (×2): qty 42.5, 90d supply, fill #0
  Filled 2020-09-24: qty 42.5, 90d supply, fill #1

## 2020-07-17 DIAGNOSIS — E669 Obesity, unspecified: Secondary | ICD-10-CM | POA: Diagnosis not present

## 2020-07-17 DIAGNOSIS — Z6841 Body Mass Index (BMI) 40.0 and over, adult: Secondary | ICD-10-CM | POA: Diagnosis not present

## 2020-07-22 ENCOUNTER — Other Ambulatory Visit (HOSPITAL_COMMUNITY): Payer: Self-pay | Admitting: Endocrinology

## 2020-07-22 ENCOUNTER — Other Ambulatory Visit (HOSPITAL_COMMUNITY): Payer: Self-pay

## 2020-07-22 MED FILL — Insulin Aspart Soln Pen-injector 100 Unit/ML: SUBCUTANEOUS | 30 days supply | Qty: 33 | Fill #0 | Status: AC

## 2020-07-22 MED FILL — Levothyroxine Sodium Tab 200 MCG: ORAL | 90 days supply | Qty: 90 | Fill #0 | Status: AC

## 2020-07-22 MED FILL — Semaglutide Soln Pen-inj 0.25 or 0.5 MG/DOSE (2 MG/1.5ML): SUBCUTANEOUS | 56 days supply | Qty: 1.5 | Fill #0 | Status: AC

## 2020-07-22 MED FILL — Insulin Degludec Soln Pen-Injector 200 Unit/ML: SUBCUTANEOUS | 15 days supply | Qty: 18 | Fill #0 | Status: AC

## 2020-07-23 ENCOUNTER — Other Ambulatory Visit (HOSPITAL_COMMUNITY): Payer: Self-pay

## 2020-07-25 ENCOUNTER — Other Ambulatory Visit (HOSPITAL_COMMUNITY): Payer: Self-pay

## 2020-07-25 MED ORDER — BUPROPION HCL ER (SR) 150 MG PO TB12
150.0000 mg | ORAL_TABLET | Freq: Two times a day (BID) | ORAL | 0 refills | Status: DC
  Filled 2020-07-25: qty 180, 90d supply, fill #0

## 2020-07-25 MED FILL — Ergocalciferol Cap 1.25 MG (50000 Unit): ORAL | 84 days supply | Qty: 12 | Fill #0 | Status: AC

## 2020-07-28 ENCOUNTER — Other Ambulatory Visit (HOSPITAL_COMMUNITY): Payer: Self-pay

## 2020-07-29 ENCOUNTER — Telehealth: Payer: Self-pay | Admitting: Gastroenterology

## 2020-07-29 ENCOUNTER — Other Ambulatory Visit: Payer: Self-pay

## 2020-07-29 MED FILL — Empagliflozin-Metformin HCl Tab ER 24HR 10-1000 MG: ORAL | 30 days supply | Qty: 30 | Fill #0 | Status: AC

## 2020-07-29 NOTE — Telephone Encounter (Signed)
In the past, it was an anal fissure that was causing symptoms. Based on prior evaluation, her hemorrhoids were not large enough for banding. In addition, banding is really best used to treat bleeding related to hemorrhoids not other symptoms.  I agree with her plans to avoid constipation. Do we need to resume fissure treatment? Sounds like she will need office follow-up, as well. Thanks.

## 2020-07-29 NOTE — Telephone Encounter (Signed)
Pt states she was recently put on Naltrexone for wt loss. She reports she is having severe constipation. Last night she went through 3 bottle of mag citrate and 2 fleets enemas before she got relief. She reports she is stopping the naltrexone because it may be causing the constipation. Pt feels like her hemorrhoids were also preventing her from passing the stool. She is interested in Hem banding. She is also going to get started on benefiber and take miralax daily so this does not happen again. Please advise regarding the banding.

## 2020-07-29 NOTE — Telephone Encounter (Signed)
Patient called states she spoke with Dr. Tarri Glenn regarding banding procedure and is seeking advise on what to do for now unless she is able to get in with Dr. Tarri Glenn prior to mid-June.

## 2020-07-29 NOTE — Telephone Encounter (Signed)
Spoke with pt and she is aware. Pt scheduled to see Dr. Tarri Glenn tomorrow at 9:50am. Pt aware of appt.

## 2020-07-30 ENCOUNTER — Encounter: Payer: Self-pay | Admitting: Gastroenterology

## 2020-07-30 ENCOUNTER — Ambulatory Visit (INDEPENDENT_AMBULATORY_CARE_PROVIDER_SITE_OTHER): Payer: Medicare Other | Admitting: Gastroenterology

## 2020-07-30 ENCOUNTER — Other Ambulatory Visit (HOSPITAL_COMMUNITY): Payer: Self-pay

## 2020-07-30 VITALS — BP 100/58 | HR 84 | Ht 66.25 in | Wt 341.2 lb

## 2020-07-30 DIAGNOSIS — K602 Anal fissure, unspecified: Secondary | ICD-10-CM

## 2020-07-30 MED ORDER — AMBULATORY NON FORMULARY MEDICATION: 1 refills | Status: DC

## 2020-07-30 MED FILL — Insulin Pen Needle 31 G X 8 MM (1/3" or 5/16"): 90 days supply | Qty: 300 | Fill #0 | Status: AC

## 2020-07-30 NOTE — Patient Instructions (Addendum)
It was a pleasure to see you today. Based on our discussion, I am providing you with my recommendations below:  RECOMMENDATION(S):   - Follow a high fiber diet, drink at least 64 ounces of water daily - Continue to use daily probiotics - Resume Benefiber every morning, then consider 2 times daily if no improvement after one week - Use Miralax as needed for escalation - Diltiazem 2% compounded with lidocaine 5% ointment applied to the rectum 3 times daily for 6-8 weeks - Return to the office 6-8 weeks if symptoms are not improving  - Colonoscopy 2026 given the family history  PRESCRIPTION:  To alleviate your discomfort, I am recommending Diltiazem cream as written below:  [facility]:  . The following medication has been prescribed:   Diltiazem 2% compounded with lidocaine 5% ointment applied to the rectum 3 times for 6-8 weeks daily o This medication MUST be compounded by a compounding pharmacy. Your prescription has been sent to:  [facility] Address: [address], [place], [place] [address]  [phone]  . Please DO NOT go directly from our office to pick up this medication! Give the pharmacy 1 day to process the prescription. Extra time is required for them to compound your medication.  FOLLOW UP:  . I would like for you to follow up with me in as needed.  . If your symptoms do not improve after 6-8 weeks, please call the office for further evaluation.  BMI:  . If you are age 72 or older, your body mass index should be between 23-30. Your There is no height or weight on file to calculate BMI. If this is out of the aforementioned range listed, please consider follow up with your Primary Care Provider.  Thank you for trusting me with your gastrointestinal care!    Thornton Park, MD, MPH

## 2020-07-30 NOTE — Progress Notes (Signed)
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 Exacerbated by diet and medication induced constipation. She reports a history of oral iron supplements for iron deficiency, but, the only available labs to me are from 10/21 and how a hemoglobin of 14.3 with normal MCV, RDW, and platelets.   PLAN: - Follow a high fiber diet, drink at least 64 ounces of water daily - Change iron supplement dosing to every other day to minimize constipation - Continue to use daily probiotics - Resume Benefiber QAM, consider BID no improvement after one week - Use Miralax PRN for escalation - Diltiazem 2% compounded with lidocaine 5% ointment applied to the rectum 3 times daily - Return in 6-8 weeks if symptoms are not improving  - Colonoscopy 2026 given the family history - Consider EGD for evaluation if there is evidence for iron deficiency anemia  Please see the "Patient Instructions" section for addition details about the plan.  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 History is significant for GERD previously treated with PPI, long-standing alternating bowel habits with diarrhea and constipation, hemorrhoids, and posterior anal fissure. History of fecal soiling. Increase in urgency. Associated urinary incontinence. She also takes a prebiotic and  probiotic for leaky gut syndrome (Biocomplete 3). Having one bowel movement daily.  Didn't find Benefiber to be much better for more money.   Participating in weight loss program through [place] [facility] on OptiFast eating one meal every day.  With this diet, she had severe hard bowel movements. Resulted in tearing and is now having bleeding. She is concerned about bleeding hemorrhoids and would like to have them banded.   Stopped naltrexone and iron supplements when the constipation was so severe. Constipation was not relieved until she used two Fleets enemas and ultimately magnesium citrate.  Recently switched to [place] from [employer] 3 due tocost, but, she finds it doesn't work as well. Was using Benefiber but she ran out of it.  Learned about [facility] testing yesterday and wants to be concerned.   Labs 01/14/20: hemoglobin 14.3  Prior endoscopic history:  Normal colonoscopy except for internal hemorrhoids 04/20/2002 [facility] Perches) Normal screening colonoscopy 03/28/13 [facility] Perches) Normal colonoscopy except for hemorrhoids and posterior anal fissure 10/15/19  Mother and father with colon polyps in their 67s. No other known family history of colon cancer or polyps. No family history of uterine/endometrial cancer, pancreatic cancer or gastric/stomach cancer.   Past Medical History:  Diagnosis Date  . Abdominal pain   . Acid reflux   . Cataract   . Constipation   . Diabetes mellitus   . Diarrhea   . Family history of adverse reaction to anesthesia   . Family history of DVT   . Hemorrhoids   . Hyperlipidemia   . Hypertension   . Joint pain   . Obesity   . Personal history of DVT (deep vein thrombosis)  arm  . Sinus complaint   . Sleep apnea    cpap  . Thyroid cancer ([place])   . Thyroid disease   . Uterine cancer ([place])   . Wears glasses     Past Surgical History:  Procedure Laterality Date  . ABDOMINAL HYSTERECTOMY    . APPENDECTOMY    . caterac surgery Bilateral   . CESAREAN  SECTION    . CHOLECYSTECTOMY    . COLONOSCOPY WITH PROPOFOL N/A 10/15/2019   Procedure: COLONOSCOPY WITH PROPOFOL;  Surgeon: Thornton Park, MD;  Location: [facility];  Service: Gastroenterology;  Laterality: N/A;  . GASTRIC RESTRICTION SURGERY  04/04/2007   lap band. removed 09/28/12  . HERNIA REPAIR    . hernia repair surgery     09/2012 with skin grafts  . TONSILLECTOMY AND ADENOIDECTOMY      Current Outpatient Medications  Medication Sig Dispense Refill  . AMBULATORY NON FORMULARY MEDICATION Diltiazem 2% compounded with lidocaine 5% ointment applied to the rectum 3 times daily for 6-8 weeks 1 each 1  . aspirin 81 MG tablet Take 81 mg by mouth daily.    Marland Kitchen atorvastatin (LIPITOR) 10 MG tablet TAKE 1 TABLET BY MOUTH ONCE DAILY 90 tablet 3  . buPROPion (WELLBUTRIN SR) 150 MG 12 hr tablet Take 1 tablet (150 mg total) by mouth 2 (two) times daily. 180 tablet 0  . diclofenac Sodium (VOLTAREN) 1 % GEL APPLY 2 GRAMS TO THE AFFECTED AREA 4 TIMES DAILY. 100 g 2  . docusate sodium (COLACE) 100 MG capsule Take 100 mg by mouth daily.    . DULoxetine (CYMBALTA) 60 MG capsule TAKE 1 CAPSULE BY MOUTH DAILY (Patient taking differently: Take 60 mg by mouth every other day.) 30 capsule 5  . Empagliflozin-metFORMIN HCl ER 01-999 MG TB24 TAKE 1 TABLET BY MOUTH DAILY 30 tablet 4  . estradiol (ESTRACE) 0.1 MG/GM vaginal cream Place a pea-sized amount in the vagina nightly for 2 weeks, then use every other night 42.5 g 3  . furosemide (LASIX) 40 MG tablet Take 40 mg by mouth as needed for fluid.    Marland Kitchen insulin aspart (NOVOLOG) 100 UNIT/ML FlexPen INJECT 55 UNITS INTO THE SKIN TWICE DAILY. 33 mL 5  . insulin degludec (TRESIBA) 200 UNIT/ML FlexTouch Pen INJECT 120 UNITS SUBCUTANEOUSLY ONCE DAILY OR AS DIRECTED. (Patient taking differently: Inject 50 Units into the skin daily.) 45 mL 3  . Insulin Pen Needle 31G X 8 MM MISC USE AS DIRECTED THREE TIMES A DAY 300 each 2  . iron polysaccharides (NIFEREX) 150 MG  capsule TAKE 1 CAPSULE BY MOUTH ONCE DAILY 30 capsule 5  . levothyroxine (SYNTHROID) 200 MCG tablet TAKE 1 TABLET BY MOUTH ONCE DAILY 90 tablet 1  . Multiple Vitamin (MULTIVITAMIN) tablet Take 1 tablet by mouth daily.    . ONE TOUCH ULTRA TEST test strip   6  . Probiotic Product (PROBIOTIC DAILY PO) Take 1 capsule by mouth daily. Biocomplete    . Semaglutide,0.25 or 0.5MG /DOS, 2 MG/1.5ML SOPN INJECT 0.5 MG INTO THE SKIN ONCE A WEEK. 1.5 mL 1  . valsartan-hydrochlorothiazide (DIOVAN-HCT) 160-25 MG tablet TAKE 1 TABLET BY MOUTH ONCE DAILY. 90 tablet 0  . Vitamin D, Ergocalciferol, (DRISDOL) 1.25 MG (50000 UNIT) CAPS capsule TAKE 1 CAPSULE BY MOUTH ONCE A WEEK 12 capsule 2  . naltrexone (DEPADE) 50 MG tablet TAKE 1/2 TABLET (25 MG TOTAL) BY MOUTH DAILY. (Patient not taking: Reported on 07/30/2020) 45 tablet 0   No current facility-administered medications for  this visit.    Allergies as of 07/30/2020 - Review Complete 07/30/2020  Allergen Reaction Noted  . Oxaprozin Other (See Comments)   . Ampicillin Itching and Rash   . Codeine Itching and Rash   . Scallops [shellfish allergy] Hives and Itching 03/14/2013    Family History  Problem Relation Age of Onset  . Heart disease Mother   . COPD Mother   . Colon polyps Mother   . Cardiomyopathy Mother   . Prostate cancer Father   . Colon polyps Father   . Factor V Leiden deficiency Father   . Pulmonary embolism Father   . Heart disease Father   . Diabetes Sister   . Asthma Sister   . Fibromyalgia Sister   . Factor V Leiden deficiency Sister   . Liver disease Sister   . Kidney disease Sister   . Colon cancer Neg Hx   . Pancreatic cancer Neg Hx   . Stomach cancer Neg Hx   . Esophageal cancer Neg Hx   . Rectal cancer Neg Hx       Physical Exam: General:   Alert,  well-nourished, pleasant and cooperative in NAD Head:  Normocephalic and atraumatic. Eyes:  Sclera clear, no icterus.   Conjunctiva pink. Abd: Central obesity. No  abdominal pain.  Rectal:   No chemical dermatitis.Posterior skin tag. Anterior fissure. No external hemorrhoids or fistula. No prolapsing hemorrhoids. No rectal prolapse. Normal anocutaneous reflex. No stool in the rectal vault. No mass or fecal impaction. Normal anal resting tone. Chaperone: Ammie Extremities:  No clubbing or edema. Neurologic:  Alert and  oriented x4;  grossly nonfocal Skin:  Intact without significant lesions or rashes. Psych:  Alert and cooperative. Normal mood and affect.    Nasha Diss L. Tarri Glenn, MD, MPH 08/01/2020, 7:07 PM

## 2020-07-30 NOTE — Progress Notes (Signed)
[facility]  Pharmacy: [facility]  Medication: Diltiazem 2% compounded with lidocaine 5% ointment applied to the rectum 3 times daily Confirmation received: Yes Document and confirmation held for future reference: Yes

## 2020-08-01 ENCOUNTER — Encounter: Payer: Self-pay | Admitting: Gastroenterology

## 2020-08-04 ENCOUNTER — Telehealth: Payer: Self-pay | Admitting: Gastroenterology

## 2020-08-04 NOTE — Telephone Encounter (Signed)
Reviewed instructions with pt over the phone. She will try taking 3 doses of miralax daily to see if that helps keep her from having an "episode" again.

## 2020-08-04 NOTE — Telephone Encounter (Signed)
Inbound call from patient requesting a call from a nurse please.  Wants to make sure she is following her instructions correctly from her last appt.

## 2020-08-05 DIAGNOSIS — Z6841 Body Mass Index (BMI) 40.0 and over, adult: Secondary | ICD-10-CM | POA: Diagnosis not present

## 2020-08-05 DIAGNOSIS — E669 Obesity, unspecified: Secondary | ICD-10-CM | POA: Diagnosis not present

## 2020-09-03 ENCOUNTER — Other Ambulatory Visit (HOSPITAL_COMMUNITY): Payer: Self-pay

## 2020-09-03 DIAGNOSIS — E669 Obesity, unspecified: Secondary | ICD-10-CM | POA: Diagnosis not present

## 2020-09-03 DIAGNOSIS — Z6841 Body Mass Index (BMI) 40.0 and over, adult: Secondary | ICD-10-CM | POA: Diagnosis not present

## 2020-09-03 MED FILL — Empagliflozin-Metformin HCl Tab ER 24HR 10-1000 MG: ORAL | 30 days supply | Qty: 30 | Fill #1 | Status: AC

## 2020-09-03 MED FILL — Insulin Aspart Soln Pen-injector 100 Unit/ML: SUBCUTANEOUS | 30 days supply | Qty: 33 | Fill #1 | Status: AC

## 2020-09-04 ENCOUNTER — Other Ambulatory Visit (HOSPITAL_COMMUNITY): Payer: Self-pay

## 2020-09-04 MED ORDER — OZEMPIC (1 MG/DOSE) 4 MG/3ML ~~LOC~~ SOPN
1.0000 mg | PEN_INJECTOR | SUBCUTANEOUS | 0 refills | Status: DC
  Filled 2020-09-04: qty 9, 84d supply, fill #0

## 2020-09-05 ENCOUNTER — Other Ambulatory Visit (HOSPITAL_COMMUNITY): Payer: Self-pay

## 2020-09-05 MED ORDER — VALSARTAN-HYDROCHLOROTHIAZIDE 160-25 MG PO TABS
1.0000 | ORAL_TABLET | Freq: Every day | ORAL | 3 refills | Status: DC
  Filled 2020-09-05: qty 90, 90d supply, fill #0

## 2020-09-05 MED ORDER — FUROSEMIDE 40 MG PO TABS
40.0000 mg | ORAL_TABLET | ORAL | 3 refills | Status: AC
  Filled 2020-09-05: qty 4, 28d supply, fill #0

## 2020-09-08 ENCOUNTER — Other Ambulatory Visit (HOSPITAL_COMMUNITY): Payer: Self-pay

## 2020-09-08 DIAGNOSIS — Z20822 Contact with and (suspected) exposure to covid-19: Secondary | ICD-10-CM | POA: Diagnosis not present

## 2020-09-10 ENCOUNTER — Other Ambulatory Visit (HOSPITAL_COMMUNITY): Payer: Self-pay

## 2020-09-10 MED ORDER — TRESIBA FLEXTOUCH 200 UNIT/ML ~~LOC~~ SOPN
80.0000 [IU] | PEN_INJECTOR | Freq: Every day | SUBCUTANEOUS | 3 refills | Status: DC
  Filled 2020-09-10: qty 36, 90d supply, fill #0
  Filled 2021-04-01: qty 36, 90d supply, fill #1

## 2020-09-15 ENCOUNTER — Other Ambulatory Visit (HOSPITAL_COMMUNITY): Payer: Self-pay

## 2020-09-15 MED FILL — Semaglutide Soln Pen-inj 0.25 or 0.5 MG/DOSE (2 MG/1.5ML): SUBCUTANEOUS | 28 days supply | Qty: 1.5 | Fill #0 | Status: AC

## 2020-09-16 DIAGNOSIS — N3946 Mixed incontinence: Secondary | ICD-10-CM | POA: Diagnosis not present

## 2020-09-16 DIAGNOSIS — N9489 Other specified conditions associated with female genital organs and menstrual cycle: Secondary | ICD-10-CM | POA: Diagnosis not present

## 2020-09-24 ENCOUNTER — Other Ambulatory Visit (HOSPITAL_COMMUNITY): Payer: Self-pay

## 2020-09-25 ENCOUNTER — Other Ambulatory Visit (HOSPITAL_COMMUNITY): Payer: Self-pay

## 2020-09-25 MED FILL — Duloxetine HCl Enteric Coated Pellets Cap 60 MG (Base Eq): ORAL | 30 days supply | Qty: 30 | Fill #0 | Status: AC

## 2020-10-03 ENCOUNTER — Other Ambulatory Visit (HOSPITAL_COMMUNITY): Payer: Self-pay

## 2020-10-03 MED FILL — Empagliflozin-Metformin HCl Tab ER 24HR 10-1000 MG: ORAL | 30 days supply | Qty: 30 | Fill #2 | Status: AC

## 2020-10-07 ENCOUNTER — Other Ambulatory Visit (HOSPITAL_COMMUNITY): Payer: Self-pay

## 2020-10-08 ENCOUNTER — Other Ambulatory Visit (HOSPITAL_COMMUNITY): Payer: Self-pay

## 2020-10-08 DIAGNOSIS — C541 Malignant neoplasm of endometrium: Secondary | ICD-10-CM | POA: Diagnosis not present

## 2020-10-08 DIAGNOSIS — E1142 Type 2 diabetes mellitus with diabetic polyneuropathy: Secondary | ICD-10-CM | POA: Diagnosis not present

## 2020-10-08 DIAGNOSIS — D649 Anemia, unspecified: Secondary | ICD-10-CM | POA: Diagnosis not present

## 2020-10-08 DIAGNOSIS — R82998 Other abnormal findings in urine: Secondary | ICD-10-CM | POA: Diagnosis not present

## 2020-10-08 DIAGNOSIS — I10 Essential (primary) hypertension: Secondary | ICD-10-CM | POA: Diagnosis not present

## 2020-10-08 DIAGNOSIS — E039 Hypothyroidism, unspecified: Secondary | ICD-10-CM | POA: Diagnosis not present

## 2020-10-08 DIAGNOSIS — F329 Major depressive disorder, single episode, unspecified: Secondary | ICD-10-CM | POA: Diagnosis not present

## 2020-10-08 DIAGNOSIS — E785 Hyperlipidemia, unspecified: Secondary | ICD-10-CM | POA: Diagnosis not present

## 2020-10-08 DIAGNOSIS — I129 Hypertensive chronic kidney disease with stage 1 through stage 4 chronic kidney disease, or unspecified chronic kidney disease: Secondary | ICD-10-CM | POA: Diagnosis not present

## 2020-10-08 DIAGNOSIS — N1831 Chronic kidney disease, stage 3a: Secondary | ICD-10-CM | POA: Diagnosis not present

## 2020-10-08 DIAGNOSIS — L089 Local infection of the skin and subcutaneous tissue, unspecified: Secondary | ICD-10-CM | POA: Diagnosis not present

## 2020-10-08 DIAGNOSIS — E559 Vitamin D deficiency, unspecified: Secondary | ICD-10-CM | POA: Diagnosis not present

## 2020-10-08 MED ORDER — NITROFURANTOIN MONOHYD MACRO 100 MG PO CAPS
100.0000 mg | ORAL_CAPSULE | Freq: Two times a day (BID) | ORAL | 0 refills | Status: DC
  Filled 2020-10-08: qty 14, 7d supply, fill #0

## 2020-10-08 MED ORDER — FUROSEMIDE 40 MG PO TABS
40.0000 mg | ORAL_TABLET | Freq: Every day | ORAL | 3 refills | Status: DC
  Filled 2020-10-08: qty 90, 90d supply, fill #0

## 2020-10-09 ENCOUNTER — Other Ambulatory Visit (HOSPITAL_COMMUNITY): Payer: Self-pay

## 2020-10-16 DIAGNOSIS — E669 Obesity, unspecified: Secondary | ICD-10-CM | POA: Diagnosis not present

## 2020-10-16 DIAGNOSIS — Z6841 Body Mass Index (BMI) 40.0 and over, adult: Secondary | ICD-10-CM | POA: Diagnosis not present

## 2020-10-17 ENCOUNTER — Other Ambulatory Visit (HOSPITAL_COMMUNITY): Payer: Self-pay

## 2020-10-17 ENCOUNTER — Ambulatory Visit: Payer: Medicare Other | Admitting: Gastroenterology

## 2020-10-17 MED FILL — Atorvastatin Calcium Tab 10 MG (Base Equivalent): ORAL | 90 days supply | Qty: 90 | Fill #0 | Status: AC

## 2020-10-20 ENCOUNTER — Other Ambulatory Visit (HOSPITAL_COMMUNITY): Payer: Self-pay

## 2020-10-20 MED FILL — Semaglutide Soln Pen-inj 0.25 or 0.5 MG/DOSE (2 MG/1.5ML): SUBCUTANEOUS | 28 days supply | Qty: 1.5 | Fill #1 | Status: CN

## 2020-10-21 ENCOUNTER — Other Ambulatory Visit (HOSPITAL_COMMUNITY): Payer: Self-pay

## 2020-10-21 DIAGNOSIS — Z6841 Body Mass Index (BMI) 40.0 and over, adult: Secondary | ICD-10-CM | POA: Diagnosis not present

## 2020-10-21 DIAGNOSIS — E669 Obesity, unspecified: Secondary | ICD-10-CM | POA: Diagnosis not present

## 2020-10-21 MED ORDER — OZEMPIC (1 MG/DOSE) 4 MG/3ML ~~LOC~~ SOPN
1.0000 mg | PEN_INJECTOR | SUBCUTANEOUS | 0 refills | Status: DC
  Filled 2020-10-24: qty 3, 28d supply, fill #0
  Filled 2020-10-24: qty 9, 84d supply, fill #0

## 2020-10-22 ENCOUNTER — Other Ambulatory Visit (HOSPITAL_COMMUNITY): Payer: Self-pay

## 2020-10-24 ENCOUNTER — Other Ambulatory Visit (HOSPITAL_COMMUNITY): Payer: Self-pay

## 2020-10-24 MED ORDER — ATORVASTATIN CALCIUM 10 MG PO TABS
10.0000 mg | ORAL_TABLET | Freq: Every day | ORAL | 3 refills | Status: DC
  Filled 2020-10-24 – 2021-01-23 (×2): qty 90, 90d supply, fill #0
  Filled 2021-04-24: qty 90, 90d supply, fill #1
  Filled 2021-07-16: qty 90, 90d supply, fill #2
  Filled 2021-10-12: qty 90, 90d supply, fill #3

## 2020-10-24 MED ORDER — LEVOTHYROXINE SODIUM 200 MCG PO TABS
200.0000 ug | ORAL_TABLET | Freq: Every day | ORAL | 3 refills | Status: DC
  Filled 2020-10-24: qty 90, 90d supply, fill #0
  Filled 2021-01-22: qty 90, 90d supply, fill #1
  Filled 2021-04-17: qty 90, 90d supply, fill #2
  Filled 2021-07-16: qty 90, 90d supply, fill #3

## 2020-10-24 MED ORDER — INSULIN PEN NEEDLE 31G X 5 MM MISC
3 refills | Status: DC
  Filled 2020-10-24: qty 300, 90d supply, fill #0

## 2020-10-24 MED FILL — Ergocalciferol Cap 1.25 MG (50000 Unit): ORAL | 84 days supply | Qty: 12 | Fill #1 | Status: AC

## 2020-10-28 ENCOUNTER — Ambulatory Visit (INDEPENDENT_AMBULATORY_CARE_PROVIDER_SITE_OTHER): Payer: Medicare Other | Admitting: Gastroenterology

## 2020-10-28 ENCOUNTER — Encounter: Payer: Self-pay | Admitting: Gastroenterology

## 2020-10-28 ENCOUNTER — Other Ambulatory Visit (HOSPITAL_COMMUNITY): Payer: Self-pay

## 2020-10-28 VITALS — BP 130/72 | HR 77 | Ht 66.0 in | Wt 344.6 lb

## 2020-10-28 DIAGNOSIS — K602 Anal fissure, unspecified: Secondary | ICD-10-CM

## 2020-10-28 DIAGNOSIS — K5900 Constipation, unspecified: Secondary | ICD-10-CM | POA: Diagnosis not present

## 2020-10-28 NOTE — Progress Notes (Addendum)
Referring Provider: Reynold Bowen, MD Primary Care Physician:  Reynold Bowen, MD  Chief complaint:  Constipation   IMPRESSION:  Chronic constipation worsened by recent weight loss efforts History of anterior and posterior anal fissure    - previously treated with Diltiazem 2% compounded with lidocaine 5% ointment  Endometrial cancer diagnosed in 2012 Family history of colon polyps (mother and father) BMI 58.43 Prior colon cancer screening:    - Normal colonoscopy except for internal hemorrhoids 04/20/2002 [facility] Perches)    - Normal screening colonoscopy 03/28/13 [facility] Perches)    - Normal colonoscopy 10/15/19 except for hemorrhoids and posterior anal fissure  Recent exacerbation in constipation is getting difficult to manage without prescription therapy. No symptoms of pelvic floor dyssnergia.  She is planning to slowly titrate her Ozempic, and this may ultimately improve her constipation. In the meantime, I encouraged to drink at least 64 ounces of water daily, continue probiotics, continue Benefiber, use Miralax, and add Linzess 145 mcg daily. Two weeks of samples provided so that she could titrate to a therapeutic dose.   PLAN: - Follow a high fiber diet, drink at least 64 ounces of water daily - Continue to use daily probiotics - Resume Benefiber QAM, consider BID no improvement after one week - Use Miralax PRN for escalation - Trial of Linzess 145 mcg daily - 2 weeks samples provided so that she could titrate the dose to maximum effect - She will send a MyChart message with symptom update with response to Linzess in 2 weeks - Discuss constipation with [facility] Weight loss team - Colonoscopy 2026 given the family history - Consider EGD for evaluation if there is evidence for iron deficiency anemia  Please see the "Patient Instructions" section for addition details about the plan.  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 History is significant for GERD previously treated with PPI, long-standing alternating bowel habits with diarrhea and constipation, hemorrhoids, and posterior anal fissure. History of fecal soiling. Increase in urgency. Associated urinary incontinence. She also takes a prebiotic and probiotic for leaky gut syndrome (Biocomplete 3). Having one bowel movement daily.  Didn't find Benefiber to be much better for more money.   Participating in weight loss program through [place] on OptiFast. With this diet, she had severe hard bowel movements with associated anal fissure. Stopped naltrexone and iron supplements when the constipation was so severe. Constipation was not relieved until she used two Fleets enemas and ultimately magnesium citrate.  She is now having episodes where she will go 5 days between bowel movements. Stools will be very hard. No recent bleeding or worsening of fissure.  Was controlling her symptoms with magnesium citrate, 5 Miralax doses, and Benefiber to get things restarted. However, magnesium citrate has been recalled and this is exacerbating her constipation further. Finally had a bowel movement today but it was after increasing her Ozempic dose.   Drinking 24 ounces of water daily. No identified food triggers.   Labs 01/14/20: hemoglobin 14.3 Labs 04/30/20: calcium 9.5  Prior endoscopic history:  Normal colonoscopy except for internal hemorrhoids 04/20/2002 [facility] Perches) Normal screening colonoscopy 03/28/13 [facility] Perches) Normal colonoscopy except for hemorrhoids and posterior anal fissure 10/15/19  Mother and father with colon polyps in their 64s. No other known family history of colon cancer or polyps. No family history of uterine/endometrial cancer, pancreatic cancer or gastric/stomach cancer.   Past Medical History:  Diagnosis Date   Abdominal pain  Acid reflux    Cataract    Constipation    Diabetes mellitus    Diarrhea    Family history of  adverse reaction to anesthesia    Family history of DVT    Hemorrhoids    Hyperlipidemia    Hypertension    Joint pain    Obesity    Personal history of DVT (deep vein thrombosis)    arm   Sinus complaint    Sleep apnea    cpap   Thyroid cancer ([place])    Thyroid disease    Uterine cancer ([place])    Wears glasses     Past Surgical History:  Procedure Laterality Date   ABDOMINAL HYSTERECTOMY     APPENDECTOMY     caterac surgery Bilateral    CESAREAN SECTION     CHOLECYSTECTOMY     COLONOSCOPY WITH PROPOFOL N/A 10/15/2019   Procedure: COLONOSCOPY WITH PROPOFOL;  Surgeon: Thornton Park, MD;  Location: [facility];  Service: Gastroenterology;  Laterality: N/A;   GASTRIC RESTRICTION SURGERY  04/04/2007   lap band. removed 09/28/12   HERNIA REPAIR     hernia repair surgery     09/2012 with skin grafts   TONSILLECTOMY AND ADENOIDECTOMY      Current Outpatient Medications  Medication Sig Dispense Refill   AMBULATORY NON FORMULARY MEDICATION Diltiazem 2% compounded with lidocaine 5% ointment applied to the rectum 3 times daily for 6-8 weeks 1 each 1   aspirin 81 MG tablet Take 81 mg by mouth daily.     atorvastatin (LIPITOR) 10 MG tablet Take 1 tablet (10 mg total) by mouth daily. 90 tablet 3   diclofenac Sodium (VOLTAREN) 1 % GEL APPLY 2 GRAMS TO THE AFFECTED AREA 4 TIMES DAILY. 100 g 2   docusate sodium (COLACE) 100 MG capsule Take 100 mg by mouth daily.     DULoxetine (CYMBALTA) 60 MG capsule TAKE 1 CAPSULE BY MOUTH DAILY (Patient taking differently: Take 60 mg by mouth every other day.) 30 capsule 5   Empagliflozin-metFORMIN HCl ER 01-999 MG TB24 TAKE 1 TABLET BY MOUTH DAILY 30 tablet 4   estradiol (ESTRACE) 0.1 MG/GM vaginal cream Place a pea-sized amount in the vagina nightly for 2 weeks, then use every other night 42.5 g 3   furosemide (LASIX) 40 MG tablet Take 1 tablet (40 mg total) by mouth once a week as directed as needed. 4 tablet 3   insulin aspart (NOVOLOG) 100  UNIT/ML FlexPen INJECT 55 UNITS INTO THE SKIN TWICE DAILY. 33 mL 5   insulin degludec (TRESIBA FLEXTOUCH) 200 UNIT/ML FlexTouch Pen Inject 80 Units into the skin daily or as directed 45 mL 3   Insulin Pen Needle 31G X 8 MM MISC USE AS DIRECTED THREE TIMES A DAY 300 each 2   iron polysaccharides (NIFEREX) 150 MG capsule TAKE 1 CAPSULE BY MOUTH ONCE DAILY 30 capsule 5   levothyroxine (SYNTHROID) 200 MCG tablet Take 1 tablet (200 mcg total) by mouth daily. 90 tablet 3   Multiple Vitamin (MULTIVITAMIN) tablet Take 1 tablet by mouth daily.     ONE TOUCH ULTRA TEST test strip   6   Probiotic Product (PROBIOTIC DAILY PO) Take 1 capsule by mouth daily. Biocomplete     Semaglutide, 1 MG/DOSE, (OZEMPIC, 1 MG/DOSE,) 4 MG/3ML SOPN Inject 1 mg into the skin once a week. 9 mL 0   valsartan-hydrochlorothiazide (DIOVAN-HCT) 160-25 MG tablet Take 1 tablet by mouth daily. 90 tablet 3   Vitamin D, Ergocalciferol, (  DRISDOL) 1.25 MG (50000 UNIT) CAPS capsule TAKE 1 CAPSULE BY MOUTH ONCE A WEEK 12 capsule 2   No current facility-administered medications for this visit.    Allergies as of 10/28/2020 - Review Complete 10/28/2020  Allergen Reaction Noted   Oxaprozin Other (See Comments)    Ampicillin Itching and Rash    Codeine Itching and Rash    Scallops [shellfish allergy] Hives and Itching 03/14/2013    Physical Exam: General:   Alert,  well-nourished, pleasant and cooperative in NAD Head:  Normocephalic and atraumatic. Eyes:  Sclera clear, no icterus.   Conjunctiva pink. Abd: Central obesity. No abdominal pain.  Extremities:  No clubbing or edema. Neurologic:  Alert and  oriented x4;  grossly nonfocal Skin:  Intact without significant lesions or rashes. Psych:  Alert and cooperative. Normal mood and affect.    Victoria Heyer L. Tarri Glenn, MD, MPH 10/28/2020, 2:32 PM

## 2020-10-28 NOTE — Patient Instructions (Addendum)
It was my pleasure to provide care to you today. Based on our discussion, I am providing you with my recommendations below:  RECOMMENDATION(S):   Trial of Linzess 145 mcg daily - Samples provided today  FOLLOW UP:  Please send me a MyChart message to let me know how you are feeling  BMI:  If you are age 72 or older, your body mass index should be between 23-30. Your There is no height or weight on file to calculate BMI. If this is out of the aforementioned range listed, please consider follow up with your Primary Care Provider.  MY CHART:  The  GI providers would like to encourage you to use [facility] to communicate with providers for non-urgent requests or questions.  Due to long hold times on the telephone, sending your provider a message by [facility] [place] may be a faster and more efficient way to get a response.  Please allow 48 business hours for a response.  Please remember that this is for non-urgent requests.   Thank you for trusting me with your gastrointestinal care!    Thornton Park, MD, MPH

## 2020-11-01 ENCOUNTER — Other Ambulatory Visit (HOSPITAL_COMMUNITY): Payer: Self-pay

## 2020-11-04 ENCOUNTER — Other Ambulatory Visit (HOSPITAL_COMMUNITY): Payer: Self-pay

## 2020-11-04 MED ORDER — SYNJARDY XR 10-1000 MG PO TB24
1.0000 | ORAL_TABLET | Freq: Every day | ORAL | 6 refills | Status: DC
  Filled 2020-11-04: qty 30, 30d supply, fill #0
  Filled 2020-12-04: qty 30, 30d supply, fill #1
  Filled 2021-01-02: qty 30, 30d supply, fill #2
  Filled 2021-01-22 – 2021-02-01 (×2): qty 30, 30d supply, fill #3
  Filled 2021-03-01: qty 30, 30d supply, fill #4
  Filled 2021-04-01: qty 30, 30d supply, fill #5
  Filled 2021-05-02: qty 30, 30d supply, fill #6

## 2020-11-04 MED ORDER — DULOXETINE HCL 60 MG PO CPEP
60.0000 mg | ORAL_CAPSULE | Freq: Every day | ORAL | 5 refills | Status: DC
  Filled 2020-11-04: qty 30, 30d supply, fill #0
  Filled 2021-04-01: qty 30, 30d supply, fill #1
  Filled 2021-05-02: qty 30, 30d supply, fill #2
  Filled 2021-06-05: qty 30, 30d supply, fill #3
  Filled 2021-07-03: qty 30, 30d supply, fill #4
  Filled 2021-07-30: qty 30, 30d supply, fill #5

## 2020-11-13 ENCOUNTER — Other Ambulatory Visit (HOSPITAL_COMMUNITY): Payer: Self-pay

## 2020-11-14 ENCOUNTER — Other Ambulatory Visit (HOSPITAL_COMMUNITY): Payer: Self-pay

## 2020-11-17 ENCOUNTER — Other Ambulatory Visit (HOSPITAL_COMMUNITY): Payer: Self-pay

## 2020-11-17 MED ORDER — VALSARTAN-HYDROCHLOROTHIAZIDE 320-25 MG PO TABS
1.0000 | ORAL_TABLET | Freq: Every day | ORAL | 3 refills | Status: DC
  Filled 2020-11-17: qty 90, 90d supply, fill #0
  Filled 2021-02-15: qty 90, 90d supply, fill #1
  Filled 2021-05-15: qty 90, 90d supply, fill #2
  Filled 2021-08-11: qty 90, 90d supply, fill #3

## 2020-11-18 DIAGNOSIS — E669 Obesity, unspecified: Secondary | ICD-10-CM | POA: Diagnosis not present

## 2020-11-18 DIAGNOSIS — Z6841 Body Mass Index (BMI) 40.0 and over, adult: Secondary | ICD-10-CM | POA: Diagnosis not present

## 2020-11-24 DIAGNOSIS — E119 Type 2 diabetes mellitus without complications: Secondary | ICD-10-CM | POA: Diagnosis not present

## 2020-11-24 DIAGNOSIS — H26493 Other secondary cataract, bilateral: Secondary | ICD-10-CM | POA: Diagnosis not present

## 2020-11-24 DIAGNOSIS — Z961 Presence of intraocular lens: Secondary | ICD-10-CM | POA: Diagnosis not present

## 2020-12-04 ENCOUNTER — Other Ambulatory Visit (HOSPITAL_COMMUNITY): Payer: Self-pay

## 2020-12-11 ENCOUNTER — Other Ambulatory Visit (HOSPITAL_COMMUNITY): Payer: Self-pay

## 2020-12-11 DIAGNOSIS — Z6841 Body Mass Index (BMI) 40.0 and over, adult: Secondary | ICD-10-CM | POA: Diagnosis not present

## 2020-12-11 DIAGNOSIS — E669 Obesity, unspecified: Secondary | ICD-10-CM | POA: Diagnosis not present

## 2020-12-11 MED ORDER — SERTRALINE HCL 50 MG PO TABS
50.0000 mg | ORAL_TABLET | Freq: Every day | ORAL | 3 refills | Status: DC
  Filled 2020-12-11: qty 30, 30d supply, fill #0

## 2020-12-23 ENCOUNTER — Other Ambulatory Visit (HOSPITAL_COMMUNITY): Payer: Self-pay

## 2020-12-23 DIAGNOSIS — E669 Obesity, unspecified: Secondary | ICD-10-CM | POA: Diagnosis not present

## 2020-12-23 DIAGNOSIS — Z6841 Body Mass Index (BMI) 40.0 and over, adult: Secondary | ICD-10-CM | POA: Diagnosis not present

## 2020-12-23 MED ORDER — DULOXETINE HCL 60 MG PO CPEP
60.0000 mg | ORAL_CAPSULE | Freq: Every day | ORAL | 0 refills | Status: DC
  Filled 2020-12-23: qty 90, 90d supply, fill #0

## 2020-12-26 ENCOUNTER — Other Ambulatory Visit (HOSPITAL_COMMUNITY): Payer: Self-pay

## 2020-12-26 MED ORDER — OZEMPIC (2 MG/DOSE) 8 MG/3ML ~~LOC~~ SOPN
PEN_INJECTOR | SUBCUTANEOUS | 1 refills | Status: DC
  Filled 2020-12-26 – 2021-01-22 (×2): qty 3, 28d supply, fill #0
  Filled 2021-02-13: qty 3, 28d supply, fill #1

## 2020-12-30 DIAGNOSIS — E669 Obesity, unspecified: Secondary | ICD-10-CM | POA: Diagnosis not present

## 2020-12-30 DIAGNOSIS — Z6841 Body Mass Index (BMI) 40.0 and over, adult: Secondary | ICD-10-CM | POA: Diagnosis not present

## 2020-12-30 DIAGNOSIS — Z1231 Encounter for screening mammogram for malignant neoplasm of breast: Secondary | ICD-10-CM | POA: Diagnosis not present

## 2021-01-05 ENCOUNTER — Other Ambulatory Visit (HOSPITAL_COMMUNITY): Payer: Self-pay

## 2021-01-22 ENCOUNTER — Other Ambulatory Visit (HOSPITAL_COMMUNITY): Payer: Self-pay

## 2021-01-22 MED FILL — Insulin Aspart Soln Pen-injector 100 Unit/ML: SUBCUTANEOUS | 30 days supply | Qty: 33 | Fill #2 | Status: AC

## 2021-01-23 ENCOUNTER — Other Ambulatory Visit (HOSPITAL_COMMUNITY): Payer: Self-pay

## 2021-01-28 ENCOUNTER — Other Ambulatory Visit (HOSPITAL_COMMUNITY): Payer: Self-pay

## 2021-01-28 MED ORDER — VITAMIN D (ERGOCALCIFEROL) 1.25 MG (50000 UNIT) PO CAPS
50000.0000 [IU] | ORAL_CAPSULE | ORAL | 2 refills | Status: DC
  Filled 2021-01-28: qty 12, 84d supply, fill #0
  Filled 2021-04-24: qty 12, 84d supply, fill #1
  Filled 2021-07-16: qty 12, 84d supply, fill #2

## 2021-02-02 ENCOUNTER — Other Ambulatory Visit (HOSPITAL_COMMUNITY): Payer: Self-pay

## 2021-02-11 NOTE — Progress Notes (Signed)
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 FVC 2.65/76%, FEV1 1.96/74%, ratio 0.74, FEF 25-75 percent 1.44/63%. NPSG 02/13/01 AHI 26/hr  ----------------------------------------------------------------------------------------y.   02/12/20- 70 year old female never smoker, retired Software engineer, followed for OSA, located by morbid obesity, DM 2, HBP,  CPAP auto 10-20/[employer] Download- compliance 93%, AHI 0.3/ hr Body weight today- 364 lbs Covid vax- 2 Moderna Flu vax- today senior Has had a mild head cold, no fever or purulent. Very happy with her CPAP. EKG mildly abnormal with PACs and long QT so she has been referred to Cardiology, Had CXR at ? [facility]- she understands it was ok. On weight loss program using Contrave.  02/12/21- 70 year old female never smoker, retired Software engineer, followed for OSA, complicated by morbid obesity, DM 2, Hyperlipidemia, HTN, Chronic Venous Insufficiency, Hx DVT, Vasomotor Rhinitis, Hx Thyroid Cancer, Hx Endometrial Cancer,  CPAP auto 10-20/[employer] Download- compliance 87%, AHI 0.4/ hr  Body weight today- 333 lbs Covid vax- 2 Moderna Flu vax- today -----Patient is tired all the time, feels like she sleeps good at night. Has to afternoon nap everyday.  Very comfortable with CPAP. Download reviewed.  Says she has no problem staying awake if driving or active. Bedtime around 1:00 AM, but can sleep until 12-2:00 PM. No sleep med. Avoids caffeine due to urinary incontinence. Feels she sleeps well- no parasomnias noted.  She suspects problem is she feels overwhelmed by house overloaded with accumulated stuff she can't cope with getting rid of. Needs to talk with an Publishing copy". [facility] weight loss program has her on Cymbalta. We talked about whether a more alerting antidepressant could be tried.  Hx thyroid nodule and Dr Forde Dandy follows thyroid  status. She notes hair loss and will talk with him about thyroid hormones.  Discussed availability of alerting meds like Adderall/ Ritalin/ modafinil. She will consider but wants to wait.   ROS-see HPI     + = positive Constitutional:    weight loss, No- night sweats, fevers, chills, fatigue, lassitude. HEENT:   No-  headaches, difficulty swallowing, tooth/dental problems, sore throat,       No-  sneezing, itching, +ear ache, nasal congestion, +post nasal drip,  CV:  No-   chest pain, orthopnea, PND, swelling in lower extremities, anasarca,  dizziness, palpitations Resp: + shortness of breath with exertion or at rest.              No-   productive cough,  No non-productive cough,  No- coughing up of blood.              No-   change in color of mucus.  No- wheezing.   Skin: No-   rash or lesions. GI:  No-   heartburn, indigestion, abdominal pain, nausea, vomiting,  GU: MS:  No-   joint pain or swelling.   Neuro-     nothing unusual Psych:  No- change in mood or affect. No depression or anxiety.  No memory loss.  OBJ- Physical Exam General- Alert, Oriented, Affect-appropriate, Distress- none acute, + morbid obesity, . Skin- rash-none, lesions- none, excoriation- none Lymphadenopathy- none Head- atraumatic            Eyes- Gross vision intact, PERRLA, conjunctivae and secretions clear            Ears- hearing intact            Nose- Clear, no-Septal dev, No-polyps, erosion, perforation  Throat- Mallampati IV , mucosa clear , drainage- none, tonsils- atrophic Neck- flexible , trachea midline, no stridor , thyroid nl, carotid no bruit Chest - symmetrical excursion , unlabored           Heart/CV- RRR , 1/6 AS murmur , no gallop  , no rub, nl s1 s2                           - JVD- none , edema- none, stasis changes- none, varices- none           Lung- clear to P&A, wheeze- none, cough- none , dullness-none, rub- none           Chest wall-  Abd-  Br/ Gen/ Rectal- Not done, not  indicated Extrem- cyanosis- none, clubbing, none, atrophy- none, strength- nl Neuro- grossly intact to observation

## 2021-02-12 ENCOUNTER — Ambulatory Visit (INDEPENDENT_AMBULATORY_CARE_PROVIDER_SITE_OTHER): Payer: Medicare Other | Admitting: Internal Medicine

## 2021-02-12 ENCOUNTER — Encounter: Payer: Self-pay | Admitting: Internal Medicine

## 2021-02-12 ENCOUNTER — Other Ambulatory Visit: Payer: Self-pay

## 2021-02-12 VITALS — BP 112/60 | HR 84 | Temp 98.8°F | Ht 67.0 in | Wt 333.6 lb

## 2021-02-12 DIAGNOSIS — G4710 Hypersomnia, unspecified: Secondary | ICD-10-CM | POA: Diagnosis not present

## 2021-02-12 DIAGNOSIS — G4733 Obstructive sleep apnea (adult) (pediatric): Secondary | ICD-10-CM | POA: Diagnosis not present

## 2021-02-12 DIAGNOSIS — Z23 Encounter for immunization: Secondary | ICD-10-CM | POA: Diagnosis not present

## 2021-02-12 NOTE — Assessment & Plan Note (Signed)
Excessive daytime somnolence despite appropriate use of CPAP and adequate time in bed. She suspects she is hiding in bed to avoid issues like how to manage all her accumulated belongings. Question "hoarding". Appropriate to seek an Publishing copy for help, and consider alternative antidepressant.  She doesn't want an alerting med for now.

## 2021-02-12 NOTE — Patient Instructions (Signed)
We can continue CPAP auto 10-20  Order- flu vax- standard  Talk with your doctor about a more alerting antidepressant  Talk with Dr Forde Dandy about thyroid status  We considered use of a stimulant med as a possibility

## 2021-02-12 NOTE — Assessment & Plan Note (Signed)
Benefits from CPAP with good compliance and control Plan- continue auto 10-20 

## 2021-02-13 ENCOUNTER — Other Ambulatory Visit (HOSPITAL_COMMUNITY): Payer: Self-pay

## 2021-02-13 DIAGNOSIS — I10 Essential (primary) hypertension: Secondary | ICD-10-CM | POA: Diagnosis not present

## 2021-02-13 DIAGNOSIS — E559 Vitamin D deficiency, unspecified: Secondary | ICD-10-CM | POA: Diagnosis not present

## 2021-02-13 DIAGNOSIS — I129 Hypertensive chronic kidney disease with stage 1 through stage 4 chronic kidney disease, or unspecified chronic kidney disease: Secondary | ICD-10-CM | POA: Diagnosis not present

## 2021-02-13 DIAGNOSIS — N1831 Chronic kidney disease, stage 3a: Secondary | ICD-10-CM | POA: Diagnosis not present

## 2021-02-13 DIAGNOSIS — E1142 Type 2 diabetes mellitus with diabetic polyneuropathy: Secondary | ICD-10-CM | POA: Diagnosis not present

## 2021-02-13 DIAGNOSIS — E785 Hyperlipidemia, unspecified: Secondary | ICD-10-CM | POA: Diagnosis not present

## 2021-02-13 DIAGNOSIS — E042 Nontoxic multinodular goiter: Secondary | ICD-10-CM | POA: Diagnosis not present

## 2021-02-13 DIAGNOSIS — E039 Hypothyroidism, unspecified: Secondary | ICD-10-CM | POA: Diagnosis not present

## 2021-02-13 DIAGNOSIS — Z794 Long term (current) use of insulin: Secondary | ICD-10-CM | POA: Diagnosis not present

## 2021-02-13 DIAGNOSIS — F329 Major depressive disorder, single episode, unspecified: Secondary | ICD-10-CM | POA: Diagnosis not present

## 2021-02-13 DIAGNOSIS — N3946 Mixed incontinence: Secondary | ICD-10-CM | POA: Diagnosis not present

## 2021-02-16 ENCOUNTER — Other Ambulatory Visit (HOSPITAL_COMMUNITY): Payer: Self-pay

## 2021-02-18 DIAGNOSIS — Z23 Encounter for immunization: Secondary | ICD-10-CM | POA: Diagnosis not present

## 2021-02-23 DIAGNOSIS — F4320 Adjustment disorder, unspecified: Secondary | ICD-10-CM | POA: Diagnosis not present

## 2021-03-02 ENCOUNTER — Other Ambulatory Visit (HOSPITAL_COMMUNITY): Payer: Self-pay

## 2021-03-04 DIAGNOSIS — I10 Essential (primary) hypertension: Secondary | ICD-10-CM | POA: Diagnosis not present

## 2021-03-04 DIAGNOSIS — M4316 Spondylolisthesis, lumbar region: Secondary | ICD-10-CM | POA: Diagnosis not present

## 2021-03-06 ENCOUNTER — Other Ambulatory Visit: Payer: Self-pay | Admitting: Neurological Surgery

## 2021-03-06 DIAGNOSIS — M4316 Spondylolisthesis, lumbar region: Secondary | ICD-10-CM

## 2021-03-12 ENCOUNTER — Other Ambulatory Visit (HOSPITAL_COMMUNITY): Payer: Self-pay

## 2021-03-12 MED ORDER — OZEMPIC (2 MG/DOSE) 8 MG/3ML ~~LOC~~ SOPN
2.0000 mg | PEN_INJECTOR | SUBCUTANEOUS | 1 refills | Status: DC
  Filled 2021-03-12: qty 3, 28d supply, fill #0
  Filled 2021-04-03: qty 3, 28d supply, fill #1

## 2021-03-16 DIAGNOSIS — F4320 Adjustment disorder, unspecified: Secondary | ICD-10-CM | POA: Diagnosis not present

## 2021-03-17 DIAGNOSIS — N3946 Mixed incontinence: Secondary | ICD-10-CM | POA: Diagnosis not present

## 2021-03-17 DIAGNOSIS — E669 Obesity, unspecified: Secondary | ICD-10-CM | POA: Diagnosis not present

## 2021-03-17 DIAGNOSIS — N949 Unspecified condition associated with female genital organs and menstrual cycle: Secondary | ICD-10-CM | POA: Diagnosis not present

## 2021-03-17 DIAGNOSIS — N3281 Overactive bladder: Secondary | ICD-10-CM | POA: Diagnosis not present

## 2021-03-23 DIAGNOSIS — F54 Psychological and behavioral factors associated with disorders or diseases classified elsewhere: Secondary | ICD-10-CM | POA: Diagnosis not present

## 2021-03-23 DIAGNOSIS — E669 Obesity, unspecified: Secondary | ICD-10-CM | POA: Diagnosis not present

## 2021-04-01 ENCOUNTER — Other Ambulatory Visit (HOSPITAL_COMMUNITY): Payer: Self-pay

## 2021-04-01 MED FILL — Insulin Aspart Soln Pen-injector 100 Unit/ML: SUBCUTANEOUS | 30 days supply | Qty: 33 | Fill #3 | Status: AC

## 2021-04-02 DIAGNOSIS — Z4689 Encounter for fitting and adjustment of other specified devices: Secondary | ICD-10-CM | POA: Diagnosis not present

## 2021-04-02 DIAGNOSIS — N3281 Overactive bladder: Secondary | ICD-10-CM | POA: Diagnosis not present

## 2021-04-02 DIAGNOSIS — N3946 Mixed incontinence: Secondary | ICD-10-CM | POA: Diagnosis not present

## 2021-04-02 DIAGNOSIS — N952 Postmenopausal atrophic vaginitis: Secondary | ICD-10-CM | POA: Diagnosis not present

## 2021-04-02 DIAGNOSIS — R82998 Other abnormal findings in urine: Secondary | ICD-10-CM | POA: Diagnosis not present

## 2021-04-03 ENCOUNTER — Other Ambulatory Visit: Payer: Self-pay

## 2021-04-03 ENCOUNTER — Ambulatory Visit
Admission: RE | Admit: 2021-04-03 | Discharge: 2021-04-03 | Disposition: A | Discharge status: Home / Self Care | Payer: Medicare Other | Source: Ambulatory Visit | Attending: Neurological Surgery | Admitting: Neurological Surgery

## 2021-04-03 ENCOUNTER — Other Ambulatory Visit (HOSPITAL_COMMUNITY): Payer: Self-pay

## 2021-04-03 DIAGNOSIS — M48061 Spinal stenosis, lumbar region without neurogenic claudication: Secondary | ICD-10-CM | POA: Diagnosis not present

## 2021-04-03 DIAGNOSIS — M5136 Other intervertebral disc degeneration, lumbar region: Secondary | ICD-10-CM | POA: Diagnosis not present

## 2021-04-03 DIAGNOSIS — M4807 Spinal stenosis, lumbosacral region: Secondary | ICD-10-CM | POA: Diagnosis not present

## 2021-04-03 DIAGNOSIS — M4316 Spondylolisthesis, lumbar region: Secondary | ICD-10-CM

## 2021-04-03 DIAGNOSIS — M2578 Osteophyte, vertebrae: Secondary | ICD-10-CM | POA: Diagnosis not present

## 2021-04-03 DIAGNOSIS — M545 Low back pain, unspecified: Secondary | ICD-10-CM | POA: Diagnosis not present

## 2021-04-03 DIAGNOSIS — R29898 Other symptoms and signs involving the musculoskeletal system: Secondary | ICD-10-CM | POA: Diagnosis not present

## 2021-04-08 ENCOUNTER — Other Ambulatory Visit (HOSPITAL_COMMUNITY): Payer: Self-pay

## 2021-04-08 MED ORDER — FLUCONAZOLE 150 MG PO TABS
150.0000 mg | ORAL_TABLET | Freq: Once | ORAL | 0 refills | Status: AC
  Filled 2021-04-08: qty 1, 1d supply, fill #0

## 2021-04-08 MED ORDER — SULFAMETHOXAZOLE-TRIMETHOPRIM 800-160 MG PO TABS
1.0000 | ORAL_TABLET | Freq: Two times a day (BID) | ORAL | 0 refills | Status: DC
  Filled 2021-04-08: qty 14, 7d supply, fill #0

## 2021-04-17 ENCOUNTER — Other Ambulatory Visit (HOSPITAL_COMMUNITY): Payer: Self-pay

## 2021-04-25 ENCOUNTER — Other Ambulatory Visit (HOSPITAL_COMMUNITY): Payer: Self-pay

## 2021-04-27 ENCOUNTER — Other Ambulatory Visit (HOSPITAL_COMMUNITY): Payer: Self-pay

## 2021-04-29 ENCOUNTER — Other Ambulatory Visit (HOSPITAL_COMMUNITY): Payer: Self-pay

## 2021-04-29 MED ORDER — ESTRADIOL 0.1 MG/GM VA CREA
TOPICAL_CREAM | VAGINAL | 3 refills | Status: DC
  Filled 2021-04-29: qty 42.5, 90d supply, fill #0
  Filled 2022-04-01: qty 42.5, 90d supply, fill #1

## 2021-04-30 ENCOUNTER — Other Ambulatory Visit (HOSPITAL_COMMUNITY): Payer: Self-pay

## 2021-05-04 ENCOUNTER — Other Ambulatory Visit (HOSPITAL_COMMUNITY): Payer: Self-pay

## 2021-05-15 ENCOUNTER — Other Ambulatory Visit (HOSPITAL_COMMUNITY): Payer: Self-pay

## 2021-05-15 MED ORDER — OZEMPIC (2 MG/DOSE) 8 MG/3ML ~~LOC~~ SOPN
2.0000 mg | PEN_INJECTOR | SUBCUTANEOUS | 1 refills | Status: DC
  Filled 2021-05-15: qty 3, 28d supply, fill #0
  Filled 2021-06-13: qty 3, 28d supply, fill #1

## 2021-05-18 ENCOUNTER — Other Ambulatory Visit (HOSPITAL_COMMUNITY): Payer: Self-pay

## 2021-05-26 ENCOUNTER — Other Ambulatory Visit (HOSPITAL_COMMUNITY): Payer: Self-pay

## 2021-05-26 MED ORDER — OZEMPIC (2 MG/DOSE) 8 MG/3ML ~~LOC~~ SOPN
2.0000 mg | PEN_INJECTOR | SUBCUTANEOUS | 1 refills | Status: DC
  Filled 2021-05-26 – 2022-01-01 (×2): qty 3, 28d supply, fill #0

## 2021-05-29 ENCOUNTER — Other Ambulatory Visit (HOSPITAL_COMMUNITY): Payer: Self-pay

## 2021-06-04 ENCOUNTER — Other Ambulatory Visit (HOSPITAL_COMMUNITY): Payer: Self-pay

## 2021-06-04 MED ORDER — MUPIROCIN 2 % EX OINT
1.0000 "application " | TOPICAL_OINTMENT | Freq: Two times a day (BID) | CUTANEOUS | 0 refills | Status: AC
  Filled 2021-06-04: qty 22, 7d supply, fill #0

## 2021-06-04 MED ORDER — KETOCONAZOLE 2 % EX CREA
1.0000 "application " | TOPICAL_CREAM | Freq: Every morning | CUTANEOUS | 11 refills | Status: AC
  Filled 2021-06-04: qty 60, 30d supply, fill #0
  Filled 2021-09-07: qty 60, 30d supply, fill #1
  Filled 2021-10-12: qty 60, 30d supply, fill #2
  Filled 2021-11-10: qty 60, 30d supply, fill #3
  Filled 2022-04-01: qty 60, 30d supply, fill #4

## 2021-06-04 MED ORDER — CLOTRIMAZOLE 1 % EX CREA
TOPICAL_CREAM | CUTANEOUS | 2 refills | Status: AC
  Filled 2021-10-12: qty 30, fill #0

## 2021-06-04 MED ORDER — HYDROCORTISONE 2.5 % EX CREA
1.0000 "application " | TOPICAL_CREAM | Freq: Two times a day (BID) | CUTANEOUS | 6 refills | Status: AC
  Filled 2021-06-04: qty 30, 21d supply, fill #0
  Filled 2021-10-12: qty 30, 21d supply, fill #1
  Filled 2021-11-10: qty 30, 21d supply, fill #2
  Filled 2022-01-01: qty 30, 21d supply, fill #3
  Filled 2022-04-01: qty 30, 21d supply, fill #4

## 2021-06-05 ENCOUNTER — Other Ambulatory Visit (HOSPITAL_COMMUNITY): Payer: Self-pay

## 2021-06-05 MED ORDER — SYNJARDY XR 10-1000 MG PO TB24
1.0000 | ORAL_TABLET | Freq: Every day | ORAL | 6 refills | Status: DC
  Filled 2021-06-05: qty 30, 30d supply, fill #0
  Filled 2021-07-03: qty 30, 30d supply, fill #1
  Filled 2021-07-30: qty 30, 30d supply, fill #2
  Filled 2021-09-07: qty 30, 30d supply, fill #3
  Filled 2021-10-02: qty 30, 30d supply, fill #4
  Filled 2021-11-01: qty 30, 30d supply, fill #5
  Filled 2021-11-26: qty 30, 30d supply, fill #6

## 2021-06-09 ENCOUNTER — Other Ambulatory Visit (HOSPITAL_COMMUNITY): Payer: Self-pay

## 2021-06-10 ENCOUNTER — Other Ambulatory Visit (HOSPITAL_COMMUNITY): Payer: Self-pay

## 2021-06-11 ENCOUNTER — Other Ambulatory Visit (HOSPITAL_COMMUNITY): Payer: Self-pay

## 2021-06-12 ENCOUNTER — Other Ambulatory Visit (HOSPITAL_COMMUNITY): Payer: Self-pay

## 2021-06-15 ENCOUNTER — Other Ambulatory Visit (HOSPITAL_COMMUNITY): Payer: Self-pay

## 2021-06-15 MED ORDER — NOVOLOG FLEXPEN 100 UNIT/ML ~~LOC~~ SOPN
55.0000 [IU] | PEN_INJECTOR | Freq: Two times a day (BID) | SUBCUTANEOUS | 3 refills | Status: DC
  Filled 2021-06-15: qty 33, 30d supply, fill #0
  Filled 2021-07-16: qty 33, 30d supply, fill #1
  Filled 2021-10-12: qty 33, 30d supply, fill #2
  Filled 2022-01-01: qty 33, 30d supply, fill #3
  Filled 2022-04-01: qty 33, 30d supply, fill #4

## 2021-06-16 ENCOUNTER — Other Ambulatory Visit (HOSPITAL_COMMUNITY): Payer: Self-pay

## 2021-06-17 ENCOUNTER — Other Ambulatory Visit (HOSPITAL_COMMUNITY): Payer: Self-pay

## 2021-06-17 MED ORDER — INSULIN PEN NEEDLE 31G X 8 MM MISC
2 refills | Status: AC
  Filled 2021-06-17: qty 300, 90d supply, fill #0

## 2021-07-03 ENCOUNTER — Other Ambulatory Visit (HOSPITAL_COMMUNITY): Payer: Self-pay

## 2021-07-11 ENCOUNTER — Other Ambulatory Visit (HOSPITAL_COMMUNITY): Payer: Self-pay

## 2021-07-13 ENCOUNTER — Other Ambulatory Visit (HOSPITAL_COMMUNITY): Payer: Self-pay

## 2021-07-13 MED ORDER — OZEMPIC (2 MG/DOSE) 8 MG/3ML ~~LOC~~ SOPN
2.0000 mg | PEN_INJECTOR | SUBCUTANEOUS | 1 refills | Status: DC
  Filled 2021-07-13: qty 3, 28d supply, fill #0
  Filled 2021-08-16: qty 3, 28d supply, fill #1
  Filled 2021-09-05: qty 3, 28d supply, fill #2
  Filled 2021-10-12: qty 3, 28d supply, fill #3
  Filled 2021-11-10: qty 3, 28d supply, fill #4
  Filled 2021-12-08: qty 3, 28d supply, fill #5

## 2021-07-16 ENCOUNTER — Other Ambulatory Visit (HOSPITAL_COMMUNITY): Payer: Self-pay

## 2021-07-27 ENCOUNTER — Telehealth: Payer: Self-pay | Admitting: Gastroenterology

## 2021-07-27 NOTE — Telephone Encounter (Signed)
Returned pt call. States she is feeling better and no longer requires appt. At pt request, appt has been cancelled. ?

## 2021-07-27 NOTE — Telephone Encounter (Signed)
Inbound call from patient stating that she is wanting to discuss her appointment with you on 4/28 at 9:30. Please advise.  ?

## 2021-07-29 ENCOUNTER — Other Ambulatory Visit (HOSPITAL_COMMUNITY): Payer: Self-pay

## 2021-07-29 MED ORDER — DOXYCYCLINE MONOHYDRATE 100 MG PO TABS
100.0000 mg | ORAL_TABLET | Freq: Two times a day (BID) | ORAL | 0 refills | Status: DC
  Filled 2021-07-29: qty 14, 7d supply, fill #0

## 2021-07-30 ENCOUNTER — Other Ambulatory Visit (HOSPITAL_COMMUNITY): Payer: Self-pay

## 2021-07-31 ENCOUNTER — Ambulatory Visit: Payer: Medicare Other | Admitting: Gastroenterology

## 2021-08-11 ENCOUNTER — Other Ambulatory Visit (HOSPITAL_COMMUNITY): Payer: Self-pay

## 2021-08-17 ENCOUNTER — Other Ambulatory Visit (HOSPITAL_COMMUNITY): Payer: Self-pay

## 2021-08-27 ENCOUNTER — Other Ambulatory Visit (HOSPITAL_COMMUNITY): Payer: Self-pay

## 2021-08-27 MED ORDER — PANTOPRAZOLE SODIUM 40 MG PO TBEC
40.0000 mg | DELAYED_RELEASE_TABLET | Freq: Every day | ORAL | 3 refills | Status: DC
  Filled 2021-08-27: qty 90, 90d supply, fill #0

## 2021-09-07 ENCOUNTER — Other Ambulatory Visit (HOSPITAL_COMMUNITY): Payer: Self-pay

## 2021-09-08 ENCOUNTER — Other Ambulatory Visit (HOSPITAL_COMMUNITY): Payer: Self-pay

## 2021-09-08 MED ORDER — DULOXETINE HCL 60 MG PO CPEP
60.0000 mg | ORAL_CAPSULE | Freq: Every day | ORAL | 11 refills | Status: DC
  Filled 2021-09-08: qty 30, 30d supply, fill #0
  Filled 2021-10-02: qty 30, 30d supply, fill #1
  Filled 2021-11-01: qty 30, 30d supply, fill #2
  Filled 2021-11-26: qty 30, 30d supply, fill #3
  Filled 2022-01-01: qty 30, 30d supply, fill #4
  Filled 2022-02-04: qty 30, 30d supply, fill #5
  Filled 2022-03-10: qty 30, 30d supply, fill #6
  Filled 2022-04-01 – 2022-04-10 (×2): qty 30, 30d supply, fill #7
  Filled 2022-05-10: qty 30, 30d supply, fill #8
  Filled 2022-06-10: qty 30, 30d supply, fill #9
  Filled 2022-07-09: qty 30, 30d supply, fill #10
  Filled 2022-08-11: qty 30, 30d supply, fill #11

## 2021-10-02 ENCOUNTER — Other Ambulatory Visit (HOSPITAL_COMMUNITY): Payer: Self-pay

## 2021-10-12 ENCOUNTER — Other Ambulatory Visit (HOSPITAL_COMMUNITY): Payer: Self-pay

## 2021-10-12 MED ORDER — VALSARTAN-HYDROCHLOROTHIAZIDE 320-25 MG PO TABS
1.0000 | ORAL_TABLET | Freq: Every day | ORAL | 3 refills | Status: DC
  Filled 2021-10-12 – 2021-11-10 (×2): qty 90, 90d supply, fill #0
  Filled 2022-02-04: qty 90, 90d supply, fill #1
  Filled 2022-05-10: qty 90, 90d supply, fill #2
  Filled 2022-08-11: qty 90, 90d supply, fill #3

## 2021-10-12 MED ORDER — VITAMIN D (ERGOCALCIFEROL) 1.25 MG (50000 UNIT) PO CAPS
50000.0000 [IU] | ORAL_CAPSULE | ORAL | 3 refills | Status: AC
Start: 2021-10-12 — End: ?
  Filled 2021-10-12: qty 12, 84d supply, fill #0
  Filled 2022-01-01: qty 12, 84d supply, fill #1
  Filled 2022-04-01: qty 12, 84d supply, fill #2
  Filled 2022-07-09: qty 12, 84d supply, fill #3

## 2021-10-12 MED ORDER — LEVOTHYROXINE SODIUM 200 MCG PO TABS
200.0000 ug | ORAL_TABLET | Freq: Every day | ORAL | 3 refills | Status: DC
  Filled 2021-10-12: qty 90, 90d supply, fill #0
  Filled 2022-01-21: qty 90, 90d supply, fill #1
  Filled 2022-04-01: qty 90, 90d supply, fill #2
  Filled 2022-07-19: qty 90, 90d supply, fill #3

## 2021-10-13 ENCOUNTER — Other Ambulatory Visit (HOSPITAL_COMMUNITY): Payer: Self-pay

## 2021-10-14 ENCOUNTER — Other Ambulatory Visit (HOSPITAL_COMMUNITY): Payer: Self-pay

## 2021-11-02 ENCOUNTER — Other Ambulatory Visit (HOSPITAL_COMMUNITY): Payer: Self-pay

## 2021-11-10 ENCOUNTER — Other Ambulatory Visit (HOSPITAL_COMMUNITY): Payer: Self-pay

## 2021-11-10 ENCOUNTER — Other Ambulatory Visit (HOSPITAL_BASED_OUTPATIENT_CLINIC_OR_DEPARTMENT_OTHER): Payer: Self-pay

## 2021-11-27 ENCOUNTER — Other Ambulatory Visit (HOSPITAL_COMMUNITY): Payer: Self-pay

## 2021-12-09 ENCOUNTER — Other Ambulatory Visit (HOSPITAL_COMMUNITY): Payer: Self-pay

## 2021-12-14 ENCOUNTER — Other Ambulatory Visit (HOSPITAL_COMMUNITY): Payer: Self-pay

## 2021-12-29 ENCOUNTER — Other Ambulatory Visit (HOSPITAL_COMMUNITY): Payer: Self-pay

## 2021-12-29 MED ORDER — MOUNJARO 7.5 MG/0.5ML ~~LOC~~ SOAJ
7.5000 mg | SUBCUTANEOUS | 0 refills | Status: DC
  Filled 2021-12-29: qty 2, 28d supply, fill #0

## 2022-01-01 ENCOUNTER — Other Ambulatory Visit (HOSPITAL_COMMUNITY): Payer: Self-pay

## 2022-01-04 ENCOUNTER — Other Ambulatory Visit (HOSPITAL_COMMUNITY): Payer: Self-pay

## 2022-01-04 MED ORDER — SYNJARDY XR 10-1000 MG PO TB24
1.0000 | ORAL_TABLET | Freq: Every day | ORAL | 3 refills | Status: DC
  Filled 2022-01-04: qty 30, 30d supply, fill #0
  Filled 2022-01-04: qty 60, 60d supply, fill #0
  Filled 2022-01-30: qty 30, 30d supply, fill #1
  Filled 2022-02-24: qty 30, 30d supply, fill #2
  Filled 2022-03-28: qty 30, 30d supply, fill #3
  Filled 2022-04-24: qty 30, 30d supply, fill #4
  Filled 2022-05-29: qty 30, 30d supply, fill #5
  Filled 2022-06-26: qty 30, 30d supply, fill #6
  Filled 2022-07-30: qty 30, 30d supply, fill #7
  Filled 2022-08-31 – 2022-09-02 (×3): qty 30, 30d supply, fill #8
  Filled 2022-09-30: qty 30, 30d supply, fill #9
  Filled 2022-10-28: qty 30, 30d supply, fill #10
  Filled 2022-12-02: qty 30, 30d supply, fill #11

## 2022-01-05 ENCOUNTER — Other Ambulatory Visit (HOSPITAL_COMMUNITY): Payer: Self-pay

## 2022-01-06 ENCOUNTER — Other Ambulatory Visit (HOSPITAL_COMMUNITY): Payer: Self-pay

## 2022-01-06 MED ORDER — NIZORAL 1 % EX SHAM: 1.0000 "application " | MEDICATED_SHAMPOO | CUTANEOUS | 3 refills | Status: DC

## 2022-01-07 ENCOUNTER — Other Ambulatory Visit (HOSPITAL_COMMUNITY): Payer: Self-pay

## 2022-01-13 ENCOUNTER — Other Ambulatory Visit (HOSPITAL_COMMUNITY): Payer: Self-pay

## 2022-01-14 ENCOUNTER — Other Ambulatory Visit (HOSPITAL_COMMUNITY): Payer: Self-pay

## 2022-01-14 MED ORDER — OZEMPIC (2 MG/DOSE) 8 MG/3ML ~~LOC~~ SOPN
2.0000 mg | PEN_INJECTOR | SUBCUTANEOUS | 0 refills | Status: DC
Start: 2022-01-14 — End: 2023-04-19
  Filled 2022-01-14: qty 9, 90d supply, fill #0

## 2022-01-21 ENCOUNTER — Other Ambulatory Visit (HOSPITAL_COMMUNITY): Payer: Self-pay

## 2022-01-22 ENCOUNTER — Other Ambulatory Visit (HOSPITAL_COMMUNITY): Payer: Self-pay

## 2022-01-22 MED ORDER — ATORVASTATIN CALCIUM 10 MG PO TABS
10.0000 mg | ORAL_TABLET | Freq: Every day | ORAL | 3 refills | Status: DC
  Filled 2022-01-22: qty 90, 90d supply, fill #0
  Filled 2022-04-01: qty 90, 90d supply, fill #1
  Filled 2022-07-19: qty 90, 90d supply, fill #2
  Filled 2022-10-28: qty 90, 90d supply, fill #3

## 2022-01-25 ENCOUNTER — Other Ambulatory Visit (HOSPITAL_COMMUNITY): Payer: Self-pay

## 2022-01-25 MED ORDER — GEMTESA 75 MG PO TABS
75.0000 mg | ORAL_TABLET | Freq: Every day | ORAL | 5 refills | Status: DC
  Filled 2022-01-25: qty 30, 30d supply, fill #0
  Filled 2022-03-28: qty 30, 30d supply, fill #1
  Filled 2022-04-24: qty 30, 30d supply, fill #2

## 2022-01-29 ENCOUNTER — Other Ambulatory Visit (HOSPITAL_COMMUNITY): Payer: Self-pay

## 2022-01-29 MED ORDER — NITROFURANTOIN MONOHYD MACRO 100 MG PO CAPS
100.0000 mg | ORAL_CAPSULE | Freq: Two times a day (BID) | ORAL | 0 refills | Status: AC
  Filled 2022-01-29: qty 14, 7d supply, fill #0

## 2022-02-01 ENCOUNTER — Other Ambulatory Visit (HOSPITAL_COMMUNITY): Payer: Self-pay

## 2022-02-05 ENCOUNTER — Other Ambulatory Visit (HOSPITAL_COMMUNITY): Payer: Self-pay

## 2022-02-11 NOTE — Progress Notes (Unsigned)
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 FVC 2.65/76%, FEV1 1.96/74%, ratio 0.74, FEF 25-75 percent 1.44/63%. NPSG 02/13/01 AHI 26/hr  ----------------------------------------------------------------------------------------y.   02/12/21- 71 year old female never smoker, retired Software engineer, followed for OSA, complicated by morbid obesity, DM 2, Hyperlipidemia, HTN, Chronic Venous Insufficiency, Hx DVT, Vasomotor Rhinitis, Hx Thyroid Cancer, Hx Endometrial Cancer,  CPAP auto 10-20/[facility] Download- compliance 87%, AHI 0.4/ hr  Body weight today- 333 lbs Covid vax- 2 Moderna Flu vax- today -----Patient is tired all the time, feels like she sleeps good at night. Has to afternoon nap everyday.  Very comfortable with CPAP. Download reviewed.  Says she has no problem staying awake if driving or active. Bedtime around 1:00 AM, but can sleep until 12-2:00 PM. No sleep med. Avoids caffeine due to urinary incontinence. Feels she sleeps well- no parasomnias noted.  She suspects problem is she feels overwhelmed by house overloaded with accumulated stuff she can't cope with getting rid of. Needs to talk with an Publishing copy". [facility] weight loss program has her on Cymbalta. We talked about whether a more alerting antidepressant could be tried.  Hx thyroid nodule and Dr Forde Dandy follows thyroid status. She notes hair loss and will talk with him about thyroid hormones.  Discussed availability of alerting meds like Adderall/ Ritalin/ modafinil. She will consider but wants to wait.  02/12/22- 71 year old female never smoker, retired Software engineer, followed for OSA, complicated by morbid obesity, DM 2, Hyperlipidemia, HTN, Chronic Venous Insufficiency, Hx DVT, Vasomotor Rhinitis, Hx Thyroid Cancer, Hx Endometrial Cancer,  CPAP auto 10-20/[facility] Download- compliance 70%, AHI 0.4/ hr Body weight  today-306 lbs Covid vax- 2 Moderna Flu vax- had -----Pt has just received a new mask, states its taking her some time getting adjusted  She uses CPAP every night. [place] with [place] CPAP machine which doesn't record. Interested in [employer]. Strugless with comfort and leak. Habitual waking about 3AM- checks email, theen goes back to sleep.   ROS-see HPI     + = positive Constitutional:    weight loss, No- night sweats, fevers, chills, fatigue, lassitude. HEENT:   No-  headaches, difficulty swallowing, tooth/dental problems, sore throat,       No-  sneezing, itching, +ear ache, nasal congestion, +post nasal drip,  CV:  No-   chest pain, orthopnea, PND, swelling in lower extremities, anasarca,  dizziness, palpitations Resp: + shortness of breath with exertion or at rest.              No-   productive cough,  No non-productive cough,  No- coughing up of blood.              No-   change in color of mucus.  No- wheezing.   Skin: No-   rash or lesions. GI:  No-   heartburn, indigestion, abdominal pain, nausea, vomiting,  GU: MS:  No-   joint pain or swelling.   Neuro-     nothing unusual Psych:  No- change in mood or affect. No depression or anxiety.  No memory loss.  OBJ- Physical Exam General- Alert, Oriented, Affect-appropriate, Distress- none acute, + morbid obesity, . Skin- rash-none, lesions- none, excoriation- none Lymphadenopathy- none Head- atraumatic            Eyes- Gross vision intact, PERRLA, conjunctivae and secretions clear            Ears- hearing intact  Nose- Clear, no-Septal dev, No-polyps, erosion, perforation             Throat- Mallampati IV , mucosa clear , drainage- none, tonsils- atrophic Neck- flexible , trachea midline, no stridor , thyroid nl, carotid no bruit Chest - symmetrical excursion , unlabored           Heart/CV- RRR , 1/6 AS murmur , no gallop  , no rub, nl s1 s2                           - JVD- none , edema- none,  stasis changes- none, varices- none           Lung- clear to P&A, wheeze- none, cough- none , dullness-none, rub- none           Chest wall-  Abd-  Br/ Gen/ Rectal- Not done, not indicated Extrem- cyanosis- none, clubbing, none, atrophy- none, strength- nl Neuro- grossly intact to observation

## 2022-02-12 ENCOUNTER — Encounter: Payer: Self-pay | Admitting: Internal Medicine

## 2022-02-12 ENCOUNTER — Ambulatory Visit (INDEPENDENT_AMBULATORY_CARE_PROVIDER_SITE_OTHER): Payer: Medicare Other | Admitting: Internal Medicine

## 2022-02-12 VITALS — BP 130/70 | HR 99 | Ht 67.0 in | Wt 306.2 lb

## 2022-02-12 DIAGNOSIS — G4733 Obstructive sleep apnea (adult) (pediatric): Secondary | ICD-10-CM | POA: Diagnosis not present

## 2022-02-12 NOTE — Patient Instructions (Signed)
Order- refer for mask fitting at sleep center  We can continue auto 10-20

## 2022-02-15 NOTE — Assessment & Plan Note (Signed)
Benefits with good compliance and control. 2 machines. Plan- mask fitting

## 2022-02-15 NOTE — Assessment & Plan Note (Signed)
Long time struggle for her.

## 2022-02-17 ENCOUNTER — Other Ambulatory Visit (HOSPITAL_COMMUNITY): Payer: Self-pay

## 2022-02-19 ENCOUNTER — Other Ambulatory Visit (HOSPITAL_COMMUNITY): Payer: Self-pay

## 2022-02-24 ENCOUNTER — Other Ambulatory Visit (HOSPITAL_COMMUNITY): Payer: Self-pay

## 2022-03-08 ENCOUNTER — Ambulatory Visit (HOSPITAL_BASED_OUTPATIENT_CLINIC_OR_DEPARTMENT_OTHER): Payer: BLUE CROSS/BLUE SHIELD | Attending: Internal Medicine | Admitting: Radiology

## 2022-03-08 DIAGNOSIS — G4733 Obstructive sleep apnea (adult) (pediatric): Secondary | ICD-10-CM

## 2022-03-10 ENCOUNTER — Other Ambulatory Visit (HOSPITAL_COMMUNITY): Payer: Self-pay

## 2022-03-30 ENCOUNTER — Other Ambulatory Visit: Payer: Self-pay

## 2022-04-01 ENCOUNTER — Other Ambulatory Visit (HOSPITAL_COMMUNITY): Payer: Self-pay

## 2022-04-15 ENCOUNTER — Other Ambulatory Visit: Payer: Self-pay

## 2022-04-15 ENCOUNTER — Other Ambulatory Visit (HOSPITAL_COMMUNITY): Payer: Self-pay

## 2022-04-17 NOTE — Progress Notes (Unsigned)
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 FVC 2.65/76%, FEV1 1.96/74%, ratio 0.74, FEF 25-75 percent 1.44/63%. NPSG 02/13/01 AHI 26/hr  ----------------------------------------------------------------------------------------y.    02/12/22- 72 year old female never smoker, retired Software engineer, followed for OSA, complicated by morbid obesity, DM 2, Hyperlipidemia, HTN, Chronic Venous Insufficiency, Hx DVT, Vasomotor Rhinitis, Hx Thyroid Cancer, Hx Endometrial Cancer,  CPAP auto 10-20/[facility] Download- compliance 70%, AHI 0.4/ hr Body weight today-306 lbs Covid vax- 2 Moderna Flu vax- had -----Pt has just received a new mask, states its taking her some time getting adjusted  She uses CPAP every night. Some nights she spends at farm with old CPAP machine which doesn't record. Interested in [employer]. Strugless with comfort and leak. Habitual waking about 3AM- checks email, theen goes back to sleep.  04/20/21-  72 year old female never smoker, retired Software engineer, followed for OSA, complicated by morbid obesity, DM 2, Hyperlipidemia, HTN, Chronic Venous Insufficiency, Hx DVT, Vasomotor Rhinitis, Hx Thyroid Cancer, Hx Endometrial Cancer,  CPAP auto 10-20/[facility] Download- compliance 80%, AHI 0.2/ hr Body weight today-310 lbs Covid vax- 2 Moderna Flu vax-  ------Cpap is doing well. Pt states she has a productive cough with yellow phlegm and sore throat. No fever Download reviewed. She still uses old CPAP when visiting family farm, resulting in some gaps on download. URI onset 4 days ago, caught from husband. Has taken a couple of leftover keflex.  No fever. Sputum yellow- green. Covid swab test here today- Negative  ROS-see HPI     + = positive Constitutional:    weight loss, No- night sweats, fevers, chills, fatigue, lassitude. HEENT:   No-  headaches, difficulty  swallowing, tooth/dental problems, sore throat,       No-  sneezing, itching, +ear ache, nasal congestion, +post nasal drip,  CV:  No-   chest pain, orthopnea, PND, swelling in lower extremities, anasarca,  dizziness, palpitations Resp: + shortness of breath with exertion or at rest.              No-   productive cough,  No non-productive cough,  No- coughing up of blood.              No-   change in color of mucus.  No- wheezing.   Skin: No-   rash or lesions. GI:  No-   heartburn, indigestion, abdominal pain, nausea, vomiting,  GU: MS:  No-   joint pain or swelling.   Neuro-     nothing unusual Psych:  No- change in mood or affect. No depression or anxiety.  No memory loss.  OBJ- Physical Exam General- Alert, Oriented, Affect-appropriate, Distress- none acute, + morbid obesity, . Skin- rash-none, lesions- none, excoriation- none Lymphadenopathy- none Head- atraumatic            Eyes- Gross vision intact, PERRLA, conjunctivae and secretions clear            Ears- hearing intact            Nose- Clear, no-Septal dev, No-polyps, erosion, perforation             Throat- Mallampati IV , mucosa clear , drainage- none, tonsils- atrophic Neck- flexible , trachea midline, no stridor , thyroid nl, carotid no bruit Chest - symmetrical excursion , unlabored           Heart/CV- RRR , 1/6 AS murmur , no gallop  , no rub, nl s1 s2                           -  JVD- none , edema- none, stasis changes- none, varices- none           Lung- clear to P&A, wheeze- none, cough- none , dullness-none, rub- none           Chest wall-  Abd-  Br/ Gen/ Rectal- Not done, not indicated Extrem- cyanosis- none, clubbing, none, atrophy- none, strength- nl Neuro- grossly intact to observation

## 2022-04-20 ENCOUNTER — Other Ambulatory Visit (HOSPITAL_COMMUNITY): Payer: Self-pay

## 2022-04-20 ENCOUNTER — Encounter: Payer: Self-pay | Admitting: Internal Medicine

## 2022-04-20 ENCOUNTER — Ambulatory Visit (INDEPENDENT_AMBULATORY_CARE_PROVIDER_SITE_OTHER): Payer: Medicare Other | Admitting: Internal Medicine

## 2022-04-20 VITALS — BP 128/64 | HR 90 | Ht 67.0 in | Wt 310.2 lb

## 2022-04-20 DIAGNOSIS — G4733 Obstructive sleep apnea (adult) (pediatric): Secondary | ICD-10-CM

## 2022-04-20 DIAGNOSIS — R059 Cough, unspecified: Secondary | ICD-10-CM

## 2022-04-20 DIAGNOSIS — J069 Acute upper respiratory infection, unspecified: Secondary | ICD-10-CM | POA: Diagnosis not present

## 2022-04-20 LAB — POC COVID19 BINAXNOW: SARS Coronavirus 2 Ag: NEGATIVE

## 2022-04-20 MED ORDER — AZITHROMYCIN 250 MG PO TABS
ORAL_TABLET | ORAL | 0 refills | Status: AC
  Filled 2022-04-20: qty 6, 5d supply, fill #0

## 2022-04-20 NOTE — Assessment & Plan Note (Signed)
May be viral. Testing neg for Covid. Since sputum persistently yellow/ green, and partly treated with keflex, will let her finish a course of antibiotic. Plan- Zpak

## 2022-04-20 NOTE — Patient Instructions (Signed)
Zpak script sent to [facility]  We can continue CPAP auto 10-20

## 2022-04-20 NOTE — Assessment & Plan Note (Signed)
Benefits from CPAP with continued good compliance and control Plan- continue auto 10-20

## 2022-04-21 ENCOUNTER — Other Ambulatory Visit (HOSPITAL_COMMUNITY): Payer: Self-pay

## 2022-04-21 MED ORDER — ESTRADIOL 0.1 MG/GM VA CREA
TOPICAL_CREAM | VAGINAL | 3 refills | Status: AC
  Filled 2022-04-21: qty 42.5, 90d supply, fill #0
  Filled 2023-01-25: qty 42.5, 90d supply, fill #1

## 2022-04-23 ENCOUNTER — Other Ambulatory Visit (HOSPITAL_COMMUNITY): Payer: Self-pay

## 2022-04-26 ENCOUNTER — Other Ambulatory Visit (HOSPITAL_COMMUNITY): Payer: Self-pay

## 2022-04-27 ENCOUNTER — Other Ambulatory Visit (HOSPITAL_COMMUNITY): Payer: Self-pay

## 2022-04-28 ENCOUNTER — Encounter: Payer: Self-pay | Admitting: Internal Medicine

## 2022-04-29 ENCOUNTER — Other Ambulatory Visit (HOSPITAL_COMMUNITY): Payer: Self-pay

## 2022-04-29 MED ORDER — OZEMPIC (2 MG/DOSE) 8 MG/3ML ~~LOC~~ SOPN
2.0000 mg | PEN_INJECTOR | SUBCUTANEOUS | 0 refills | Status: DC
  Filled 2022-04-29: qty 3, 30d supply, fill #0

## 2022-05-10 ENCOUNTER — Other Ambulatory Visit: Payer: Self-pay

## 2022-05-11 ENCOUNTER — Other Ambulatory Visit: Payer: Self-pay

## 2022-05-11 ENCOUNTER — Other Ambulatory Visit (HOSPITAL_COMMUNITY): Payer: Self-pay

## 2022-05-12 ENCOUNTER — Other Ambulatory Visit (HOSPITAL_COMMUNITY): Payer: Self-pay

## 2022-05-12 MED ORDER — NYSTATIN-TRIAMCINOLONE 100000-0.1 UNIT/GM-% EX OINT
1.0000 | TOPICAL_OINTMENT | Freq: Two times a day (BID) | CUTANEOUS | 2 refills | Status: AC
  Filled 2022-05-12: qty 60, 30d supply, fill #0
  Filled 2022-07-19: qty 60, 30d supply, fill #1
  Filled 2022-10-13 – 2022-10-27 (×2): qty 60, 30d supply, fill #2

## 2022-05-13 ENCOUNTER — Other Ambulatory Visit (HOSPITAL_COMMUNITY): Payer: Self-pay

## 2022-05-13 MED ORDER — PANTOPRAZOLE SODIUM 40 MG PO TBEC
40.0000 mg | DELAYED_RELEASE_TABLET | Freq: Every day | ORAL | 3 refills | Status: DC
  Filled 2022-05-13: qty 90, 90d supply, fill #0

## 2022-05-22 ENCOUNTER — Other Ambulatory Visit (HOSPITAL_COMMUNITY): Payer: Self-pay

## 2022-05-24 ENCOUNTER — Other Ambulatory Visit (HOSPITAL_COMMUNITY): Payer: Self-pay

## 2022-05-26 ENCOUNTER — Other Ambulatory Visit (HOSPITAL_COMMUNITY): Payer: Self-pay

## 2022-05-26 MED ORDER — OZEMPIC (2 MG/DOSE) 8 MG/3ML ~~LOC~~ SOPN
2.0000 mg | PEN_INJECTOR | SUBCUTANEOUS | 0 refills | Status: DC
  Filled 2022-05-26: qty 3, 30d supply, fill #0

## 2022-05-27 ENCOUNTER — Other Ambulatory Visit (HOSPITAL_COMMUNITY): Payer: Self-pay

## 2022-06-03 ENCOUNTER — Other Ambulatory Visit (HOSPITAL_COMMUNITY): Payer: Self-pay | Admitting: Cardiology

## 2022-06-03 DIAGNOSIS — Z8249 Family history of ischemic heart disease and other diseases of the circulatory system: Secondary | ICD-10-CM

## 2022-06-08 ENCOUNTER — Ambulatory Visit (HOSPITAL_BASED_OUTPATIENT_CLINIC_OR_DEPARTMENT_OTHER)
Admission: RE | Admit: 2022-06-08 | Discharge: 2022-06-08 | Disposition: A | Discharge status: Home / Self Care | Payer: Medicare Other | Source: Ambulatory Visit | Attending: Cardiology | Admitting: Cardiology

## 2022-06-08 DIAGNOSIS — Z8249 Family history of ischemic heart disease and other diseases of the circulatory system: Secondary | ICD-10-CM | POA: Insufficient documentation

## 2022-06-10 ENCOUNTER — Other Ambulatory Visit (HOSPITAL_COMMUNITY): Payer: Self-pay

## 2022-06-10 MED ORDER — NOVOLOG FLEXPEN 100 UNIT/ML ~~LOC~~ SOPN
55.0000 [IU] | PEN_INJECTOR | Freq: Two times a day (BID) | SUBCUTANEOUS | 3 refills | Status: DC
  Filled 2022-06-10: qty 45, 41d supply, fill #0
  Filled 2022-08-31 – 2022-09-02 (×2): qty 45, 41d supply, fill #1
  Filled 2022-11-10: qty 45, 41d supply, fill #2
  Filled 2023-01-25: qty 45, 41d supply, fill #3

## 2022-06-30 ENCOUNTER — Other Ambulatory Visit (HOSPITAL_COMMUNITY): Payer: Self-pay

## 2022-06-30 MED ORDER — MOUNJARO 7.5 MG/0.5ML ~~LOC~~ SOAJ
7.5000 mg | SUBCUTANEOUS | 1 refills | Status: DC
  Filled 2022-06-30: qty 2, 28d supply, fill #0

## 2022-07-09 ENCOUNTER — Other Ambulatory Visit (HOSPITAL_COMMUNITY): Payer: Self-pay

## 2022-07-20 ENCOUNTER — Other Ambulatory Visit (HOSPITAL_COMMUNITY): Payer: Self-pay

## 2022-07-20 ENCOUNTER — Other Ambulatory Visit: Payer: Self-pay

## 2022-07-27 ENCOUNTER — Encounter (HOSPITAL_COMMUNITY): Payer: Self-pay

## 2022-07-27 ENCOUNTER — Other Ambulatory Visit (HOSPITAL_COMMUNITY): Payer: Self-pay

## 2022-07-27 MED ORDER — TRIAMCINOLONE ACETONIDE 0.1 % EX CREA
1.0000 | TOPICAL_CREAM | Freq: Two times a day (BID) | CUTANEOUS | 1 refills | Status: AC
  Filled 2022-07-27: qty 454, 30d supply, fill #0
  Filled 2022-12-07: qty 454, 30d supply, fill #1

## 2022-07-27 MED ORDER — TACROLIMUS 0.1 % EX OINT
1.0000 | TOPICAL_OINTMENT | Freq: Two times a day (BID) | CUTANEOUS | 1 refills | Status: AC
  Filled 2022-07-27 – 2023-07-20 (×2): qty 30, 15d supply, fill #0

## 2022-07-29 ENCOUNTER — Other Ambulatory Visit (HOSPITAL_COMMUNITY): Payer: Self-pay

## 2022-07-29 MED ORDER — OZEMPIC (2 MG/DOSE) 8 MG/3ML ~~LOC~~ SOPN
2.0000 mg | PEN_INJECTOR | SUBCUTANEOUS | 0 refills | Status: DC
  Filled 2022-07-29: qty 9, 84d supply, fill #0
  Filled 2022-08-11: qty 3, 28d supply, fill #0

## 2022-07-30 ENCOUNTER — Other Ambulatory Visit (HOSPITAL_COMMUNITY): Payer: Self-pay

## 2022-08-05 ENCOUNTER — Other Ambulatory Visit (HOSPITAL_COMMUNITY): Payer: Self-pay

## 2022-08-11 ENCOUNTER — Other Ambulatory Visit (HOSPITAL_COMMUNITY): Payer: Self-pay

## 2022-09-01 ENCOUNTER — Other Ambulatory Visit (HOSPITAL_COMMUNITY): Payer: Self-pay

## 2022-09-02 ENCOUNTER — Other Ambulatory Visit (HOSPITAL_COMMUNITY): Payer: Self-pay

## 2022-09-03 ENCOUNTER — Other Ambulatory Visit (HOSPITAL_COMMUNITY): Payer: Self-pay

## 2022-09-03 MED ORDER — DULOXETINE HCL 60 MG PO CPEP
60.0000 mg | ORAL_CAPSULE | Freq: Every day | ORAL | 3 refills | Status: DC
  Filled 2022-09-03: qty 90, 90d supply, fill #0
  Filled 2022-12-02: qty 90, 90d supply, fill #1
  Filled 2023-03-09: qty 90, 90d supply, fill #2
  Filled 2023-06-09: qty 90, 90d supply, fill #3

## 2022-09-23 ENCOUNTER — Other Ambulatory Visit (HOSPITAL_COMMUNITY): Payer: Self-pay

## 2022-09-23 MED ORDER — ACCU-CHEK GUIDE VI STRP
ORAL_STRIP | Freq: Three times a day (TID) | 3 refills | Status: AC
  Filled 2022-09-23: qty 300, 90d supply, fill #0
  Filled 2022-12-07: qty 300, 100d supply, fill #0
  Filled 2023-01-25: qty 300, 90d supply, fill #0

## 2022-09-23 MED ORDER — AMLODIPINE BESYLATE 2.5 MG PO TABS
2.5000 mg | ORAL_TABLET | Freq: Every day | ORAL | 3 refills | Status: DC
  Filled 2022-09-23: qty 90, 90d supply, fill #0
  Filled 2022-12-17 – 2022-12-31 (×2): qty 90, 90d supply, fill #1
  Filled 2023-03-24: qty 90, 90d supply, fill #2
  Filled 2023-06-24: qty 90, 90d supply, fill #3

## 2022-09-23 MED ORDER — CEPHALEXIN 500 MG PO CAPS
500.0000 mg | ORAL_CAPSULE | Freq: Two times a day (BID) | ORAL | 0 refills | Status: DC
  Filled 2022-09-23: qty 20, 10d supply, fill #0

## 2022-09-24 ENCOUNTER — Other Ambulatory Visit (HOSPITAL_COMMUNITY): Payer: Self-pay

## 2022-09-28 ENCOUNTER — Other Ambulatory Visit (HOSPITAL_COMMUNITY): Payer: Self-pay

## 2022-09-28 MED ORDER — OZEMPIC (2 MG/DOSE) 8 MG/3ML ~~LOC~~ SOPN
2.0000 mg | PEN_INJECTOR | SUBCUTANEOUS | 0 refills | Status: DC
  Filled 2022-09-28: qty 9, 84d supply, fill #0

## 2022-09-29 ENCOUNTER — Other Ambulatory Visit (HOSPITAL_COMMUNITY): Payer: Self-pay

## 2022-09-30 ENCOUNTER — Other Ambulatory Visit: Payer: Self-pay

## 2022-09-30 ENCOUNTER — Other Ambulatory Visit (HOSPITAL_COMMUNITY): Payer: Self-pay

## 2022-10-01 ENCOUNTER — Other Ambulatory Visit (HOSPITAL_COMMUNITY): Payer: Self-pay

## 2022-10-01 MED ORDER — TROSPIUM CHLORIDE 20 MG PO TABS
20.0000 mg | ORAL_TABLET | Freq: Two times a day (BID) | ORAL | 6 refills | Status: DC
  Filled 2022-10-01: qty 60, 30d supply, fill #0
  Filled 2022-11-10: qty 60, 30d supply, fill #1
  Filled 2022-12-07: qty 60, 30d supply, fill #2
  Filled 2023-01-25: qty 60, 30d supply, fill #3

## 2022-10-13 ENCOUNTER — Other Ambulatory Visit (HOSPITAL_COMMUNITY): Payer: Self-pay

## 2022-10-13 ENCOUNTER — Other Ambulatory Visit: Payer: Self-pay

## 2022-10-27 ENCOUNTER — Other Ambulatory Visit (HOSPITAL_COMMUNITY): Payer: Self-pay

## 2022-10-28 ENCOUNTER — Other Ambulatory Visit (HOSPITAL_COMMUNITY): Payer: Self-pay

## 2022-10-29 ENCOUNTER — Telehealth: Payer: Self-pay | Admitting: Internal Medicine

## 2022-10-29 DIAGNOSIS — G4733 Obstructive sleep apnea (adult) (pediatric): Secondary | ICD-10-CM

## 2022-10-29 NOTE — Telephone Encounter (Signed)
PT needs a new CPAP machine./[facility] said they need an RX and a note that she has been using a CPAP with good result for her Medicare claim and or whatever else may be needed. Send thru Epic or fax to [phone]  Her current CPAP was issues in 2018

## 2022-11-04 ENCOUNTER — Other Ambulatory Visit (HOSPITAL_COMMUNITY): Payer: Self-pay

## 2022-11-04 MED ORDER — LEVOTHYROXINE SODIUM 200 MCG PO TABS
200.0000 ug | ORAL_TABLET | Freq: Every day | ORAL | 3 refills | Status: DC
  Filled 2022-11-04: qty 90, 90d supply, fill #0
  Filled 2023-02-03: qty 90, 90d supply, fill #1
  Filled 2023-05-13: qty 90, 90d supply, fill #2
  Filled 2023-08-09: qty 90, 90d supply, fill #3

## 2022-11-05 ENCOUNTER — Other Ambulatory Visit (HOSPITAL_COMMUNITY): Payer: Self-pay

## 2022-11-05 NOTE — Telephone Encounter (Signed)
New order placed

## 2022-11-10 ENCOUNTER — Other Ambulatory Visit (HOSPITAL_COMMUNITY): Payer: Self-pay

## 2022-11-10 MED ORDER — VALSARTAN-HYDROCHLOROTHIAZIDE 320-25 MG PO TABS
1.0000 | ORAL_TABLET | Freq: Every day | ORAL | 4 refills | Status: DC
  Filled 2022-11-10: qty 90, 90d supply, fill #0
  Filled 2023-02-08: qty 90, 90d supply, fill #1
  Filled 2023-05-13: qty 90, 90d supply, fill #2
  Filled 2023-08-09: qty 90, 90d supply, fill #3
  Filled 2023-11-04: qty 90, 90d supply, fill #4

## 2022-11-11 ENCOUNTER — Other Ambulatory Visit (HOSPITAL_COMMUNITY): Payer: Self-pay

## 2022-12-03 ENCOUNTER — Other Ambulatory Visit (HOSPITAL_COMMUNITY): Payer: Self-pay

## 2022-12-08 ENCOUNTER — Other Ambulatory Visit (HOSPITAL_COMMUNITY): Payer: Self-pay

## 2022-12-08 ENCOUNTER — Other Ambulatory Visit: Payer: Self-pay

## 2022-12-21 DIAGNOSIS — Z23 Encounter for immunization: Secondary | ICD-10-CM | POA: Diagnosis not present

## 2022-12-28 ENCOUNTER — Other Ambulatory Visit (HOSPITAL_COMMUNITY): Payer: Self-pay

## 2022-12-28 MED ORDER — OZEMPIC (2 MG/DOSE) 8 MG/3ML ~~LOC~~ SOPN
2.0000 mg | PEN_INJECTOR | SUBCUTANEOUS | 0 refills | Status: AC
  Filled 2022-12-28: qty 9, 84d supply, fill #0

## 2022-12-29 ENCOUNTER — Other Ambulatory Visit (HOSPITAL_COMMUNITY): Payer: Self-pay

## 2022-12-30 ENCOUNTER — Other Ambulatory Visit (HOSPITAL_COMMUNITY): Payer: Self-pay

## 2022-12-31 ENCOUNTER — Other Ambulatory Visit (HOSPITAL_COMMUNITY): Payer: Self-pay

## 2023-01-06 ENCOUNTER — Other Ambulatory Visit (HOSPITAL_COMMUNITY): Payer: Self-pay

## 2023-01-06 MED ORDER — BUPROPION HCL ER (XL) 150 MG PO TB24
150.0000 mg | ORAL_TABLET | Freq: Every morning | ORAL | 3 refills | Status: DC
  Filled 2023-01-06: qty 90, 90d supply, fill #0
  Filled 2023-04-01: qty 90, 90d supply, fill #1
  Filled 2023-07-02: qty 90, 90d supply, fill #2
  Filled 2023-09-29: qty 90, 90d supply, fill #3

## 2023-01-07 ENCOUNTER — Other Ambulatory Visit (HOSPITAL_COMMUNITY): Payer: Self-pay

## 2023-01-07 MED ORDER — SYNJARDY XR 10-1000 MG PO TB24
1.0000 | ORAL_TABLET | Freq: Every day | ORAL | 3 refills | Status: DC
  Filled 2023-01-07: qty 90, 90d supply, fill #0
  Filled 2023-04-01: qty 90, 90d supply, fill #1
  Filled 2023-07-02: qty 90, 90d supply, fill #2
  Filled 2023-09-29: qty 90, 90d supply, fill #3

## 2023-01-07 MED ORDER — TROSPIUM CHLORIDE ER 60 MG PO CP24
60.0000 mg | ORAL_CAPSULE | Freq: Every day | ORAL | 6 refills | Status: DC
  Filled 2023-01-07 – 2023-07-20 (×3): qty 30, 30d supply, fill #0
  Filled 2023-08-16: qty 30, 30d supply, fill #1
  Filled 2023-09-15: qty 30, 30d supply, fill #2
  Filled 2023-10-16 – 2023-10-31 (×2): qty 30, 30d supply, fill #3
  Filled 2023-11-24: qty 30, 30d supply, fill #4

## 2023-01-09 ENCOUNTER — Other Ambulatory Visit (HOSPITAL_COMMUNITY): Payer: Self-pay

## 2023-01-25 ENCOUNTER — Other Ambulatory Visit (HOSPITAL_COMMUNITY): Payer: Self-pay

## 2023-01-25 ENCOUNTER — Other Ambulatory Visit: Payer: Self-pay

## 2023-01-25 MED ORDER — ATORVASTATIN CALCIUM 10 MG PO TABS
10.0000 mg | ORAL_TABLET | Freq: Every day | ORAL | 3 refills | Status: DC
  Filled 2023-01-25: qty 90, 90d supply, fill #0
  Filled 2023-04-30: qty 90, 90d supply, fill #1
  Filled 2023-07-29 – 2023-08-16 (×2): qty 90, 90d supply, fill #2
  Filled 2023-11-04: qty 90, 90d supply, fill #3

## 2023-01-31 ENCOUNTER — Other Ambulatory Visit (HOSPITAL_COMMUNITY): Payer: Self-pay

## 2023-01-31 MED ORDER — SULFAMETHOXAZOLE-TRIMETHOPRIM 800-160 MG PO TABS
1.0000 | ORAL_TABLET | Freq: Two times a day (BID) | ORAL | 0 refills | Status: AC
  Filled 2023-01-31: qty 6, 3d supply, fill #0

## 2023-02-03 ENCOUNTER — Other Ambulatory Visit: Payer: Self-pay

## 2023-02-03 ENCOUNTER — Other Ambulatory Visit (HOSPITAL_COMMUNITY): Payer: Self-pay

## 2023-02-11 ENCOUNTER — Other Ambulatory Visit (HOSPITAL_COMMUNITY): Payer: Self-pay

## 2023-02-11 MED ORDER — NITROFURANTOIN MONOHYD MACRO 100 MG PO CAPS
100.0000 mg | ORAL_CAPSULE | Freq: Two times a day (BID) | ORAL | 0 refills | Status: DC
  Filled 2023-02-11: qty 10, 5d supply, fill #0

## 2023-02-14 ENCOUNTER — Other Ambulatory Visit (HOSPITAL_COMMUNITY): Payer: Self-pay

## 2023-02-14 MED ORDER — NITROFURANTOIN MONOHYD MACRO 100 MG PO CAPS
ORAL_CAPSULE | ORAL | 0 refills | Status: DC
  Filled 2023-02-14: qty 10, 5d supply, fill #0

## 2023-03-25 ENCOUNTER — Other Ambulatory Visit (HOSPITAL_COMMUNITY): Payer: Self-pay

## 2023-03-25 MED ORDER — OZEMPIC (2 MG/DOSE) 8 MG/3ML ~~LOC~~ SOPN
2.0000 mg | PEN_INJECTOR | SUBCUTANEOUS | 0 refills | Status: AC
  Filled 2023-03-25: qty 9, 84d supply, fill #0

## 2023-04-01 ENCOUNTER — Other Ambulatory Visit (HOSPITAL_COMMUNITY): Payer: Self-pay

## 2023-04-01 ENCOUNTER — Other Ambulatory Visit: Payer: Self-pay

## 2023-04-01 MED ORDER — NYSTATIN 100000 UNIT/GM EX OINT
1.0000 | TOPICAL_OINTMENT | Freq: Two times a day (BID) | CUTANEOUS | 1 refills | Status: AC
  Filled 2023-04-01: qty 30, 15d supply, fill #0
  Filled 2023-07-20: qty 30, 15d supply, fill #1

## 2023-04-01 MED ORDER — NOVOLOG FLEXPEN 100 UNIT/ML ~~LOC~~ SOPN
55.0000 [IU] | PEN_INJECTOR | Freq: Two times a day (BID) | SUBCUTANEOUS | 3 refills | Status: DC
  Filled 2023-04-01: qty 45, 41d supply, fill #0
  Filled 2023-07-20: qty 45, 41d supply, fill #1
  Filled 2023-09-15: qty 45, 41d supply, fill #2
  Filled 2024-02-19: qty 45, 41d supply, fill #3

## 2023-04-18 ENCOUNTER — Other Ambulatory Visit (HOSPITAL_COMMUNITY): Payer: Self-pay

## 2023-04-18 MED ORDER — NITROFURANTOIN MONOHYD MACRO 100 MG PO CAPS
100.0000 mg | ORAL_CAPSULE | Freq: Two times a day (BID) | ORAL | 0 refills | Status: DC
  Filled 2023-04-18: qty 14, 7d supply, fill #0

## 2023-04-19 ENCOUNTER — Ambulatory Visit: Payer: Medicare Other | Admitting: Nurse Practitioner

## 2023-04-19 ENCOUNTER — Encounter: Payer: Self-pay | Admitting: Nurse Practitioner

## 2023-04-19 VITALS — BP 128/60 | HR 87 | Ht 67.0 in | Wt 322.2 lb

## 2023-04-19 DIAGNOSIS — G4733 Obstructive sleep apnea (adult) (pediatric): Secondary | ICD-10-CM

## 2023-04-19 NOTE — Patient Instructions (Addendum)
 Continue to use CPAP every night, minimum of 4-6 hours a night.  Change equipment as directed. Wash your tubing with warm soap and water daily, hang to dry. Wash humidifier portion weekly. Use bottled, distilled water and change daily Be aware of reduced alertness and do not drive or operate heavy machinery if experiencing this or drowsiness.  Exercise encouraged, as tolerated. Healthy weight management discussed.  Avoid or decrease alcohol consumption and medications that make you more sleepy, if possible. Notify if persistent daytime sleepiness occurs even with consistent use of PAP therapy.  Orders placed for new CPAP Call [facility] if you haven't heard something in the next 2-3 weeks  Follow up in 10-12 weeks with Dr. Neysa or Katie Delmore Sear,NP. If symptoms worsen, please contact office for sooner follow up or seek emergency care.

## 2023-04-19 NOTE — Assessment & Plan Note (Signed)
 Moderate OSA on CPAP. Excellent compliance and control. Utilizes older machine at vacation home, accounting for gaps on current download. Compliance 77%, average use 10 hr 43 min. Receives benefit from use. Aware of proper care/use. Understands risks of untreated OSA. Due for a new CPAP machine. Orders placed today. Advised to notify if she does not hear something regarding this. Safe driving practices reviewed.  Patient Instructions  Continue to use CPAP every night, minimum of 4-6 hours a night.  Change equipment as directed. Wash your tubing with warm soap and water daily, hang to dry. Wash humidifier portion weekly. Use bottled, distilled water and change daily Be aware of reduced alertness and do not drive or operate heavy machinery if experiencing this or drowsiness.  Exercise encouraged, as tolerated. Healthy weight management discussed.  Avoid or decrease alcohol consumption and medications that make you more sleepy, if possible. Notify if persistent daytime sleepiness occurs even with consistent use of PAP therapy.  Orders placed for new CPAP Call [facility] if you haven't heard something in the next 2-3 weeks  Follow up in 10-12 weeks with Dr. Neysa or Katie Rylea Selway,NP. If symptoms worsen, please contact office for sooner follow up or seek emergency care.

## 2023-04-19 NOTE — Progress Notes (Signed)
 @Patient  ID: [name], female    DOB: 02-16-1951, 73 y.o.   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 She is a patient of Dr. Saundra and last seen in office 04/20/2022. Past medical history significant for obesity, DM on insulin , hx of thyroid  cancer, vasomotor rhinitis, hx of endometrial cancer, HTN, HLD.   TEST/EVENTS:  02/13/2001 NPSG: AHI 26/h 06/04/2015 spirometry: FVC 76, FEV1 74, ratio 74  04/20/2022: OV with Dr. Neysa. On CPAP 10-20 cmH2O. DME [facility]. Residual AHI 0.2/h, compliance 80%. CPAP doing well. Complains of a productive cough with yellow phlegm and sore throat. No fever. COVID negative. URI onset 4 days ago. Caught form husband. Took a couple leftover keflex . May be viral. Since sputum persistently yellow/green and she partially treated with keflex , will finish abx with Z pack sent to pharmacy.   04/19/2023: Today - follow up Discussed the use of AI scribe software for clinical note transcription with the patient, who gave verbal consent to proceed.  History of Present Illness   [name], a patient with a history of sleep apnea, presents for a routine CPAP follow-up. The patient reports using an older, non-recording CPAP machine when traveling, which accounts for the intermittent gaps in recorded usage. She uses her CPAP every night. Does not Victoria a night without it.  The patient also reports an unsuccessful attempt to obtain a new CPAP machine last year due to a prescription expiration issue with the medical supply company. She expresses frustration with the company's lack of communication and requests another prescription for a new CPAP machine.  The patient uses a full-face mask for her CPAP therapy, which covers both her nose and mouth. She reports sleeping well with the device and maintains  good energy levels during the day. No drowsy driving or morning headaches.     03/20/2023-04/18/2023: CPAP 10-20 cmH2O 23/30 days; 77% >4 hr; average use 10 hr 43 min Pressure 95th 13.6 Leaks 95th 8.5 AHI 0.3  Allergies  Allergen Reactions   Oxaprozin Other (See Comments)    Causes skin to slough off.   Ampicillin Itching and Rash    Starts on torso and hands. Spreads up to neck.   Codeine Itching and Rash    Starts at trunk and hands. Spreads up to the neck.   Scallops [Shellfish Allergy] Hives and Itching    Immunization History  Administered Date(s) Administered   Fluad Quad(high Dose 65+) 02/12/2021   Fluad Trivalent(High Dose 65+) 12/21/2022   Influenza Split 02/16/2010, 12/05/2010, 12/07/2010, 01/27/2012, 02/05/2012, 12/28/2012, 01/03/2013, 01/03/2014, 02/26/2014   Influenza, High Dose Seasonal PF 02/12/2020   Influenza, Quadrivalent, Recombinant, Inj, Pf 02/14/2018, 01/12/2019   Influenza,inj,Quad PF,6+ Mos 02/26/2014, 01/04/2015, 02/25/2015   Influenza-Unspecified 01/12/2022   Moderna Sars-Covid-2 Vaccination 09/13/2019, 10/11/2019   Pneumococcal Polysaccharide-23 05/17/2011, 10/25/2011, 04/05/2012, 08/10/2013, 12/21/2022   Pneumococcal-Unspecified 08/11/2012   Td 06/11/2011   Zoster, Live 08/10/2013    Past Medical History:  Diagnosis Date   Abdominal pain    Acid reflux    Cataract    Constipation    Diabetes mellitus    Diarrhea    Family history of adverse reaction to anesthesia    Family history of DVT    Hemorrhoids    Hyperlipidemia    Hypertension    Joint pain  Obesity    Personal history of DVT (deep vein thrombosis)    arm   Sinus complaint    Sleep apnea    cpap   Thyroid  cancer (HCC)    Thyroid  disease    Uterine cancer (HCC)    Wears glasses     Tobacco History: Social History   Tobacco Use  Smoking Status Never  Smokeless Tobacco Never   Counseling given: Not Answered   Outpatient Medications Prior to Visit  Medication  Sig Dispense Refill   amLODipine  (NORVASC ) 2.5 MG tablet Take 1 tablet (2.5 mg total) by mouth daily. 90 tablet 3   aspirin 81 MG tablet Take 81 mg by mouth daily.     atorvastatin  (LIPITOR) 10 MG tablet Take 1 tablet (10 mg total) by mouth daily. 90 tablet 3   buPROPion  (WELLBUTRIN  XL) 150 MG 24 hr tablet Take 1 tablet (150 mg total) by mouth in the morning. 90 tablet 3   clotrimazole  (LOTRIMIN ) 1 % cream APPLY TO THE AFFECTED AND SURROUNDING AREAS OF SKIN BY TOPICAL ROUTE 2 TIMES PER DAY IN THE MORNING AND EVENING 30 g 2   docusate sodium (COLACE) 100 MG capsule Take 200 mg by mouth daily.     DULoxetine  (CYMBALTA ) 60 MG capsule Take 1 capsule (60 mg total) by mouth daily. 90 capsule 3   Empagliflozin -metFORMIN  HCl ER (SYNJARDY  XR) 01-999 MG TB24 Take 1 tablet by mouth daily. 90 tablet 3   estradiol  (ESTRACE ) 0.1 MG/GM vaginal cream Discard plastic applicator. Insert blueberry size amount of cream on finger in vagina 2 or 3 nights per week. 42.5 g 3   furosemide  (LASIX ) 40 MG tablet Take 1 tablet (40 mg total) by mouth once a week as directed as needed. 4 tablet 3   glucose blood (ACCU-CHEK GUIDE) test strip Check blood sugars  three times a day 300 strip 3   hydrocortisone  2.5 % cream Apply 1 application (a small, thin layer) topically to affected area 2 (two) times daily for 2-3 weeks. 30 g 6   insulin  aspart (NOVOLOG  FLEXPEN) 100 UNIT/ML FlexPen Inject 55 Units into the skin 2 (two) times daily. 45 mL 3   Insulin  Pen Needle 31G X 8 MM MISC Use as directed 3 times a day 300 each 2   ketoconazole  (NIZORAL ) 2 % cream Apply 1 application (a small amount) topically to affected area every morning. 60 g 11   levothyroxine  (SYNTHROID ) 200 MCG tablet Take 1 tablet (200 mcg total) by mouth daily. 90 tablet 3   Multiple Vitamin (MULTIVITAMIN) tablet Take 1 tablet by mouth daily.     mupirocin  ointment (BACTROBAN ) 2 % Apply 1 application (a small amount) topically 2 (two) times daily to affected area on  nose for 1 week. 22 g 0   nitrofurantoin , macrocrystal-monohydrate, (MACROBID ) 100 MG capsule Take 1 capsule (100 mg total) by mouth 2 (two) times daily for 5 days. Start 2 days prior to Botox procedure 10 capsule 0   nitrofurantoin , macrocrystal-monohydrate, (MACROBID ) 100 MG capsule Take 1 capsule (100 mg total) by mouth 2 (two) times a day for 5 days. 10 capsule 0   nitrofurantoin , macrocrystal-monohydrate, (MACROBID ) 100 MG capsule Take 1 capsule (100 mg total) by mouth 2 (two) times a day for 7 days. 14 capsule 0   nystatin  ointment (MYCOSTATIN ) Apply 1 Application topically 2 (two) times daily. 30 g 1   nystatin -triamcinolone  ointment (MYCOLOG) Apply 1 Application topically to affected areas 2 (two) times daily as needed. 60 g  2   ONE TOUCH ULTRA TEST test strip   6   Probiotic Product (PROBIOTIC DAILY PO) Take 1 capsule by mouth daily. Biocomplete     Semaglutide , 2 MG/DOSE, (OZEMPIC , 2 MG/DOSE,) 8 MG/3ML SOPN Inject 2 mg into the skin once a week. 9 mL 0   Semaglutide , 2 MG/DOSE, (OZEMPIC , 2 MG/DOSE,) 8 MG/3ML SOPN Inject 2 mg into the skin once a week. 9 mL 0   Semaglutide , 2 MG/DOSE, (OZEMPIC , 2 MG/DOSE,) 8 MG/3ML SOPN Inject 2 mg into the skin once a week. 9 mL 0   tacrolimus  (PROTOPIC ) 0.1 % ointment Apply as directed to affected area twice a day; For use on face. Taper as able 30 g 1   triamcinolone  cream (KENALOG ) 0.1 % Apply as directed to affected area twice a day. Apply to itchy areas avoiding face. Taper as able 454 g 1   trospium  (SANCTURA ) 20 MG tablet Take 1 tablet (20 mg total) by mouth 2 (two) times daily in the morning and evening. Take before meals 60 tablet 6   Trospium  Chloride 60 MG CP24 Take 1 capsule (60 mg total) by mouth daily. 30 capsule 6   valsartan -hydrochlorothiazide  (DIOVAN -HCT) 320-25 MG tablet Take 1 tablet by mouth daily. 90 tablet 4   vitamin B-12 (CYANOCOBALAMIN) 100 MCG tablet Vitamin B12     Vitamin D , Ergocalciferol , (DRISDOL ) 1.25 MG (50000 UNIT) CAPS  capsule Take 1 capsule (50,000 Units total) by mouth once a week. 12 capsule 3   cephALEXin  (KEFLEX ) 500 MG capsule Take 1 capsule (500 mg total) by mouth 2 (two) times daily. 20 capsule 0   estradiol  (ESTRACE ) 0.1 MG/GM vaginal cream Place a pea-sized amount in the vagina nightly for 2 weeks, then use every other night 42.5 g 3   pantoprazole  (PROTONIX ) 40 MG tablet Take 1 tablet (40 mg total) by mouth daily. 90 tablet 3   pantoprazole  (PROTONIX ) 40 MG tablet Take 1 tablet (40 mg total) by mouth daily. 90 tablet 3   Semaglutide , 2 MG/DOSE, (OZEMPIC , 2 MG/DOSE,) 8 MG/3ML SOPN Inject 2 mg into the skin once a week. 9 mL 0   Semaglutide , 2 MG/DOSE, (OZEMPIC , 2 MG/DOSE,) 8 MG/3ML SOPN Inject 2 mg into the skin once a week. 9 mL 0   Vibegron  (GEMTESA ) 75 MG TABS Take 1 tablet (75 mg total) by mouth daily. 30 tablet 5   No facility-administered medications prior to visit.     Review of Systems:   Constitutional: No weight loss or gain, night sweats, fevers, chills, fatigue, or lassitude. HEENT: No headaches, difficulty swallowing, tooth/dental problems, or sore throat. No sneezing, itching, ear ache, nasal congestion, or post nasal drip CV:  No chest pain, orthopnea, PND, swelling in lower extremities, anasarca, dizziness, palpitations, syncope Resp: No shortness of breath with exertion or at rest. No excess mucus or change in color of mucus. No productive or non-productive. No hemoptysis. No wheezing.  No chest wall deformity GI:  No heartburn, indigestion GU: No nocturia  Neuro: No dizziness or lightheadedness.  Psych: No depression or anxiety. Mood stable.     Physical Exam:  BP 128/60 (BP Location: Left Arm, Patient Position: Sitting, Cuff Size: Large)   Pulse 87   Ht 5' 7 (1.702 m)   Wt (!) 322 lb 3.2 oz (146.1 kg)   SpO2 96%   BMI 50.46 kg/m   GEN: Pleasant, interactive, well-appearing; obese; in no acute distress HEENT:  Normocephalic and atraumatic. PERRLA. Sclera white.  Nasal turbinates pink, moist  and patent bilaterally. No rhinorrhea present. Oropharynx pink and moist, without exudate or edema. No lesions, ulcerations, or postnasal drip. Mallampati II NECK:  Supple w/ fair ROM. No JVD present. Normal carotid impulses w/o bruits. Thyroid  symmetrical with no goiter or nodules palpated. No lymphadenopathy.   CV: RRR, no m/r/g, no peripheral edema. Pulses intact, +2 bilaterally. No cyanosis, pallor or clubbing. PULMONARY:  Unlabored, regular breathing. Clear bilaterally A&P w/o wheezes/rales/rhonchi. No accessory muscle use.  GI: BS present and normoactive. Soft, non-tender to palpation. No organomegaly or masses detected. MSK: No erythema, warmth or tenderness. Cap refil <2 sec all extrem. No deformities or joint swelling noted.  Neuro: A/Ox3. No focal deficits noted.   Skin: Warm, no lesions or rashe Psych: Normal affect and behavior. Judgement and thought content appropriate.     Lab Results:  CBC    Component Value Date/Time   WBC 13.0 (H) 04/05/2007 0350   RBC 5.38 (H) 04/05/2007 0350   HGB 11.1 (L) 04/05/2007 0350   HCT 35.1 (L) 04/05/2007 0350   PLT 304 04/05/2007 0350   MCV 65.3 (L) 04/05/2007 0350   MCHC 31.6 04/05/2007 0350   RDW 19.9 (H) 04/05/2007 0350   LYMPHSABS 2.2 04/05/2007 0350   MONOABS 0.8 04/05/2007 0350   EOSABS 0.1 04/05/2007 0350   BASOSABS 0.0 04/05/2007 0350    BMET    Component Value Date/Time   NA 140 03/28/2007 0830   K 4.1 03/28/2007 0830   CL 103 03/28/2007 0830   CO2 29 03/28/2007 0830   GLUCOSE 124 (H) 03/28/2007 0830   BUN 16 03/28/2007 0830   CREATININE 0.81 03/28/2007 0830   CALCIUM  9.1 03/28/2007 0830   GFRNONAA >60 03/28/2007 0830   GFRAA  03/28/2007 0830    >60        The eGFR has been calculated using the MDRD equation. This calculation has not been validated in all clinical    BNP No results found for: BNP   Imaging:  No results found.  Administration History     None            No data to display          No results found for: NITRICOXIDE      Assessment & Plan:   Obstructive sleep apnea Moderate OSA on CPAP. Excellent compliance and control. Utilizes older machine at vacation home, accounting for gaps on current download. Compliance 77%, average use 10 hr 43 min. Receives benefit from use. Aware of proper care/use. Understands risks of untreated OSA. Due for a new CPAP machine. Orders placed today. Advised to notify if she does not hear something regarding this. Safe driving practices reviewed.  Patient Instructions  Continue to use CPAP every night, minimum of 4-6 hours a night.  Change equipment as directed. Wash your tubing with warm soap and water daily, hang to dry. Wash humidifier portion weekly. Use bottled, distilled water and change daily Be aware of reduced alertness and do not drive or operate heavy machinery if experiencing this or drowsiness.  Exercise encouraged, as tolerated. Healthy weight management discussed.  Avoid or decrease alcohol consumption and medications that make you more sleepy, if possible. Notify if persistent daytime sleepiness occurs even with consistent use of PAP therapy.  Orders placed for new CPAP Call [facility] if you haven't heard something in the next 2-3 weeks  Follow up in 10-12 weeks with Dr. Neysa or Victoria Nykole Matos,NP. If symptoms worsen, please contact office for sooner follow up or seek  emergency care.    Advised if symptoms do not improve or worsen, to please contact office for sooner follow up or seek emergency care.   I spent 31 minutes of dedicated to the care of this patient on the date of this encounter to include pre-visit review of records, face-to-face time with the patient discussing conditions above, post visit ordering of testing, clinical documentation with the electronic health record, making appropriate referrals as documented, and communicating necessary findings to members of the patients care  team.  Victoria LULLA Rouleau, NP 04/19/2023  Pt aware and understands NP's role.

## 2023-04-21 ENCOUNTER — Ambulatory Visit: Payer: Medicare Other | Admitting: Internal Medicine

## 2023-05-14 ENCOUNTER — Other Ambulatory Visit: Payer: Self-pay

## 2023-05-15 ENCOUNTER — Other Ambulatory Visit: Payer: Self-pay

## 2023-05-20 ENCOUNTER — Other Ambulatory Visit (HOSPITAL_COMMUNITY): Payer: Self-pay

## 2023-07-02 NOTE — Progress Notes (Unsigned)
 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 FVC 2.65/76%, FEV1 1.96/74%, ratio 0.74, FEF 25-75 percent 1.44/63%. NPSG 02/13/01 AHI 26/hr  ----------------------------------------------------------------------------------------y.   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 Pt states she has a productive cough with yellow phlegm and sore throat. No fever Download reviewed. She still uses old CPAP [place], resulting in some gaps on download. URI onset 4 days ago, caught from husband. Has taken a couple of leftover keflex.  No fever. Sputum yellow- green. 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 Never misses a night. Discussed the use of AI scribe software for clinical note transcription with the patient, who gave verbal consent to proceed.  History of Present Illness   The patient, with a history of sleep apnea, presents with concerns about her new CPAP machine. She reports that the new machine does not provide the same airflow as her old machine, which she found comforting. She also mentions that the new  machine does not have an automatic cutoff feature, which her old machine had. Despite these issues, the patient reports that she has been doing well with her CPAP therapy, with less than one breakthrough apnea an hour.  She also mentions having Botox implanted in her bladder, but does not provide further details about this. The patient's husband also uses a CPAP machine, but has not been seen for a checkup in ten years.     ROS-see HPI     + = positive Constitutional:    weight loss, No- night sweats, fevers, chills, fatigue, lassitude. HEENT:   No-  headaches, difficulty swallowing, tooth/dental problems, sore throat,       No-  sneezing, itching, +ear ache, nasal congestion, +post nasal drip,  CV:  No-   chest pain, orthopnea, PND, swelling in lower extremities, anasarca,  dizziness, palpitations Resp: + shortness of breath with exertion or at rest.              No-   productive cough,  No non-productive cough,  No- coughing up of blood.              No-   change in color of mucus.  No- wheezing.   Skin: No-   rash or lesions. GI:  No-   heartburn, indigestion, abdominal pain, nausea, vomiting,  GU: MS:  No-   joint pain or swelling.   Neuro-     nothing unusual Psych:  No- change in mood or affect. No depression or anxiety.  No memory loss.  OBJ- Physical Exam General- Alert, Oriented, Affect-appropriate, Distress- none acute, + morbid obesity, . Skin- rash-none, lesions- none, excoriation- none Lymphadenopathy- none Head- atraumatic            Eyes- Gross vision intact, PERRLA, conjunctivae and secretions clear  Ears- hearing intact            Nose- Clear, no-Septal dev, No-polyps, erosion, perforation             Throat- Mallampati IV , mucosa clear , drainage- none, tonsils- atrophic Neck- flexible , trachea midline, no stridor , thyroid nl, carotid no bruit Chest - symmetrical excursion , unlabored           Heart/CV- RRR , 1/6 AS murmur , no gallop  , no rub, nl s1 s2                            - JVD- none , edema- none, stasis changes- none, varices- none           Lung- clear to P&A, wheeze- none, cough- none , dullness-none, rub- none           Chest wall-  Abd-  Br/ Gen/ Rectal- Not done, not indicated Extrem- cyanosis- none, clubbing, none, atrophy- none, strength- nl Neuro- grossly intact to observation Assessment and Plan:    Obstructive Sleep Apnea   She uses a CPAP machine for obstructive sleep apnea. Recently received a new CPAP machine from [facility], which is an older model than her previous one, causing issues with airflow and uncertainty about functionality. She is considering continuing with her old CPAP machine, which is effective despite occasional noises. Current settings are between 10 and 20, with less than one breakthrough apnea per hour, indicating good control. [facility]'s recent acquisition and issues with unnecessary home oxygen have impacted service quality.   - Send an order to [facility] to address CPAP machine issues.   - Advise her to bring the CPAP machine to [facility] for download software installation .   - Consider using an SD card chip for data transfer if she uses the machine at a different location.   - Discuss alternative suppliers if [facility] service remains unsatisfactory.    Increased Heart Rate   Reported an increased heart rate of 102, higher than her usual rate of 84. Concerned about the possibility of atrial fibrillation due to family history. Heart rate has since decreased, with no current evidence of atrial fibrillation.    Urinary Tract Infections   Experiencing urinary tract infections, potentially related to [place] use. Considering discontinuing Synjardy due to these infections.    Follow-up for Husband's CPAP Management  - he was started elsewhere with no follow-up. Considering having her husband evaluated for CPAP management, as he has not been assessed in ten years. Open to seeing him before my retirement at the  end of the year. Discussed switching to a provider within the same health system for better continuity of care.   - Schedule an appointment for her husband for CPAP management if desired.

## 2023-07-03 ENCOUNTER — Other Ambulatory Visit: Payer: Self-pay

## 2023-07-04 ENCOUNTER — Encounter: Payer: Self-pay | Admitting: Internal Medicine

## 2023-07-04 ENCOUNTER — Ambulatory Visit (INDEPENDENT_AMBULATORY_CARE_PROVIDER_SITE_OTHER): Payer: Medicare Other | Admitting: Internal Medicine

## 2023-07-04 VITALS — BP 123/60 | HR 96 | Temp 98.2°F | Resp 18 | Ht 67.0 in | Wt 312.6 lb

## 2023-07-04 DIAGNOSIS — G4733 Obstructive sleep apnea (adult) (pediatric): Secondary | ICD-10-CM

## 2023-07-04 NOTE — Patient Instructions (Signed)
 Order DME [facility]- please add [employer]View/ card for download access. Continue auto10-20, mask of choice, humidifier, supplies, card

## 2023-07-07 ENCOUNTER — Other Ambulatory Visit (HOSPITAL_COMMUNITY): Payer: Self-pay

## 2023-07-07 MED ORDER — CIPROFLOXACIN HCL 500 MG PO TABS
500.0000 mg | ORAL_TABLET | Freq: Two times a day (BID) | ORAL | 0 refills | Status: DC
  Filled 2023-07-07: qty 14, 7d supply, fill #0

## 2023-07-20 ENCOUNTER — Other Ambulatory Visit (HOSPITAL_COMMUNITY): Payer: Self-pay

## 2023-07-20 MED ORDER — OZEMPIC (2 MG/DOSE) 8 MG/3ML ~~LOC~~ SOPN
2.0000 mg | PEN_INJECTOR | SUBCUTANEOUS | 0 refills | Status: AC
  Filled 2023-07-20: qty 9, 84d supply, fill #0

## 2023-07-21 ENCOUNTER — Encounter (HOSPITAL_COMMUNITY): Payer: Self-pay

## 2023-07-21 ENCOUNTER — Other Ambulatory Visit: Payer: Self-pay

## 2023-07-21 ENCOUNTER — Other Ambulatory Visit (HOSPITAL_COMMUNITY): Payer: Self-pay

## 2023-08-09 ENCOUNTER — Other Ambulatory Visit (HOSPITAL_COMMUNITY): Payer: Self-pay

## 2023-08-11 ENCOUNTER — Other Ambulatory Visit (HOSPITAL_COMMUNITY): Payer: Self-pay

## 2023-08-16 ENCOUNTER — Other Ambulatory Visit (HOSPITAL_COMMUNITY): Payer: Self-pay

## 2023-09-06 ENCOUNTER — Other Ambulatory Visit (HOSPITAL_COMMUNITY): Payer: Self-pay

## 2023-09-06 MED ORDER — DULOXETINE HCL 60 MG PO CPEP
60.0000 mg | ORAL_CAPSULE | Freq: Every day | ORAL | 3 refills | Status: AC
  Filled 2023-09-06: qty 90, 90d supply, fill #0
  Filled 2023-12-02 – 2023-12-19 (×2): qty 90, 90d supply, fill #1
  Filled 2024-03-09: qty 90, 90d supply, fill #2

## 2023-09-15 ENCOUNTER — Other Ambulatory Visit (HOSPITAL_COMMUNITY): Payer: Self-pay

## 2023-09-15 MED ORDER — AMLODIPINE BESYLATE 2.5 MG PO TABS
2.5000 mg | ORAL_TABLET | Freq: Every day | ORAL | 3 refills | Status: AC
  Filled 2023-09-15: qty 90, 90d supply, fill #0
  Filled 2023-12-06 – 2023-12-19 (×2): qty 90, 90d supply, fill #1
  Filled 2024-03-09: qty 90, 90d supply, fill #2

## 2023-09-30 ENCOUNTER — Other Ambulatory Visit: Payer: Self-pay

## 2023-10-16 ENCOUNTER — Other Ambulatory Visit (HOSPITAL_COMMUNITY): Payer: Self-pay

## 2023-10-26 ENCOUNTER — Other Ambulatory Visit (HOSPITAL_COMMUNITY): Payer: Self-pay

## 2023-10-26 MED ORDER — OZEMPIC (2 MG/DOSE) 8 MG/3ML ~~LOC~~ SOPN
2.0000 mg | PEN_INJECTOR | SUBCUTANEOUS | 0 refills | Status: DC
  Filled 2023-10-26: qty 9, 84d supply, fill #0

## 2023-10-27 ENCOUNTER — Other Ambulatory Visit (HOSPITAL_COMMUNITY): Payer: Self-pay

## 2023-10-31 ENCOUNTER — Other Ambulatory Visit (HOSPITAL_COMMUNITY): Payer: Self-pay

## 2023-11-10 ENCOUNTER — Other Ambulatory Visit (HOSPITAL_COMMUNITY): Payer: Self-pay

## 2023-11-11 ENCOUNTER — Other Ambulatory Visit (HOSPITAL_COMMUNITY): Payer: Self-pay

## 2023-11-11 MED ORDER — CEPHALEXIN 500 MG PO CAPS
500.0000 mg | ORAL_CAPSULE | Freq: Two times a day (BID) | ORAL | 0 refills | Status: AC
  Filled 2023-11-11: qty 14, 7d supply, fill #0

## 2023-11-11 MED ORDER — LEVOTHYROXINE SODIUM 200 MCG PO TABS
200.0000 ug | ORAL_TABLET | Freq: Every day | ORAL | 3 refills | Status: AC
  Filled 2023-11-11: qty 90, 90d supply, fill #0
  Filled 2024-02-13: qty 90, 90d supply, fill #1

## 2023-11-18 ENCOUNTER — Telehealth: Payer: Self-pay

## 2023-11-18 NOTE — Telephone Encounter (Signed)
 Received CMN via fax from [facility]. Placed on Dr. Saundra desk in [facility] P[facility] for him to sign. Will fax back to [facility] at [phone] once signed.

## 2023-11-25 ENCOUNTER — Other Ambulatory Visit (HOSPITAL_COMMUNITY): Payer: Self-pay

## 2023-11-28 ENCOUNTER — Other Ambulatory Visit (HOSPITAL_COMMUNITY): Payer: Self-pay

## 2023-11-29 ENCOUNTER — Other Ambulatory Visit (HOSPITAL_COMMUNITY): Payer: Self-pay

## 2023-12-15 ENCOUNTER — Other Ambulatory Visit (HOSPITAL_COMMUNITY): Payer: Self-pay

## 2023-12-15 MED ORDER — GEMTESA 75 MG PO TABS
1.0000 | ORAL_TABLET | Freq: Every day | ORAL | 6 refills | Status: AC
  Filled 2023-12-15: qty 30, 30d supply, fill #0
  Filled 2024-01-21: qty 30, 30d supply, fill #1
  Filled 2024-02-19: qty 30, 30d supply, fill #2
  Filled 2024-03-20: qty 30, 30d supply, fill #3
  Filled 2024-04-26: qty 30, 30d supply, fill #4

## 2023-12-16 ENCOUNTER — Other Ambulatory Visit (HOSPITAL_COMMUNITY): Payer: Self-pay

## 2023-12-19 ENCOUNTER — Other Ambulatory Visit (HOSPITAL_COMMUNITY): Payer: Self-pay

## 2023-12-20 ENCOUNTER — Other Ambulatory Visit (HOSPITAL_COMMUNITY): Payer: Self-pay

## 2023-12-20 MED ORDER — BUPROPION HCL ER (XL) 300 MG PO TB24
300.0000 mg | ORAL_TABLET | Freq: Every morning | ORAL | 0 refills | Status: DC
  Filled 2023-12-20: qty 90, 90d supply, fill #0

## 2023-12-28 ENCOUNTER — Other Ambulatory Visit (HOSPITAL_COMMUNITY): Payer: Self-pay

## 2023-12-28 MED ORDER — BUPROPION HCL ER (XL) 300 MG PO TB24
300.0000 mg | ORAL_TABLET | ORAL | 0 refills | Status: AC
  Filled 2024-02-19 – 2024-02-27 (×2): qty 90, 90d supply, fill #0

## 2023-12-29 ENCOUNTER — Other Ambulatory Visit (HOSPITAL_COMMUNITY): Payer: Self-pay

## 2023-12-30 ENCOUNTER — Other Ambulatory Visit (HOSPITAL_COMMUNITY): Payer: Self-pay

## 2023-12-30 MED ORDER — NITROFURANTOIN MONOHYD MACRO 100 MG PO CAPS
100.0000 mg | ORAL_CAPSULE | Freq: Two times a day (BID) | ORAL | 0 refills | Status: DC
  Filled 2023-12-30: qty 10, 5d supply, fill #0

## 2024-01-02 ENCOUNTER — Other Ambulatory Visit (HOSPITAL_COMMUNITY): Payer: Self-pay

## 2024-01-02 MED ORDER — SYNJARDY XR 10-1000 MG PO TB24
1.0000 | ORAL_TABLET | Freq: Every day | ORAL | 3 refills | Status: AC
  Filled 2024-01-02: qty 90, 90d supply, fill #0
  Filled 2024-04-02: qty 90, 90d supply, fill #1

## 2024-01-05 NOTE — Progress Notes (Signed)
See group note.

## 2024-01-17 ENCOUNTER — Other Ambulatory Visit: Payer: Self-pay | Admitting: *Deleted

## 2024-01-17 ENCOUNTER — Ambulatory Visit: Attending: Nurse Practitioner

## 2024-01-17 DIAGNOSIS — I459 Conduction disorder, unspecified: Secondary | ICD-10-CM

## 2024-01-17 DIAGNOSIS — R42 Dizziness and giddiness: Secondary | ICD-10-CM

## 2024-01-17 NOTE — Progress Notes (Unsigned)
 Enrolled for [employer] to mail a ZIO XT long term holter monitor to the patients address on file.   EP to read

## 2024-01-24 ENCOUNTER — Other Ambulatory Visit (HOSPITAL_COMMUNITY): Payer: Self-pay

## 2024-01-24 MED ORDER — PANTOPRAZOLE SODIUM 40 MG PO TBEC
40.0000 mg | DELAYED_RELEASE_TABLET | Freq: Every day | ORAL | 3 refills | Status: AC
  Filled 2024-01-24: qty 90, 90d supply, fill #0

## 2024-01-25 ENCOUNTER — Other Ambulatory Visit (HOSPITAL_COMMUNITY): Payer: Self-pay

## 2024-01-25 MED ORDER — CELECOXIB 200 MG PO CAPS
200.0000 mg | ORAL_CAPSULE | Freq: Two times a day (BID) | ORAL | 0 refills | Status: DC
  Filled 2024-01-25: qty 28, 14d supply, fill #0

## 2024-02-02 ENCOUNTER — Other Ambulatory Visit (HOSPITAL_COMMUNITY): Payer: Self-pay

## 2024-02-02 MED ORDER — ATORVASTATIN CALCIUM 10 MG PO TABS
10.0000 mg | ORAL_TABLET | Freq: Every day | ORAL | 3 refills | Status: AC
  Filled 2024-02-02: qty 90, 90d supply, fill #0
  Filled 2024-05-03: qty 90, 90d supply, fill #1

## 2024-02-02 MED ORDER — VALSARTAN-HYDROCHLOROTHIAZIDE 320-25 MG PO TABS
1.0000 | ORAL_TABLET | Freq: Every day | ORAL | 4 refills | Status: AC
  Filled 2024-02-02: qty 90, 90d supply, fill #0
  Filled 2024-05-09: qty 90, 90d supply, fill #1

## 2024-02-09 ENCOUNTER — Other Ambulatory Visit (HOSPITAL_COMMUNITY): Payer: Self-pay

## 2024-02-10 ENCOUNTER — Other Ambulatory Visit (HOSPITAL_COMMUNITY): Payer: Self-pay

## 2024-02-10 MED ORDER — OZEMPIC (2 MG/DOSE) 8 MG/3ML ~~LOC~~ SOPN
2.0000 mg | PEN_INJECTOR | SUBCUTANEOUS | 0 refills | Status: AC
  Filled 2024-02-10: qty 9, 84d supply, fill #0

## 2024-02-13 ENCOUNTER — Other Ambulatory Visit (HOSPITAL_COMMUNITY): Payer: Self-pay

## 2024-02-19 ENCOUNTER — Other Ambulatory Visit (HOSPITAL_COMMUNITY): Payer: Self-pay

## 2024-02-20 ENCOUNTER — Other Ambulatory Visit: Payer: Self-pay

## 2024-02-20 ENCOUNTER — Other Ambulatory Visit (HOSPITAL_COMMUNITY): Payer: Self-pay

## 2024-02-20 MED ORDER — BUPROPION HCL ER (XL) 150 MG PO TB24
150.0000 mg | ORAL_TABLET | ORAL | 3 refills | Status: AC
  Filled 2024-02-20: qty 90, 90d supply, fill #0

## 2024-02-27 ENCOUNTER — Other Ambulatory Visit (HOSPITAL_COMMUNITY): Payer: Self-pay

## 2024-03-11 DIAGNOSIS — R42 Dizziness and giddiness: Secondary | ICD-10-CM

## 2024-03-11 DIAGNOSIS — I459 Conduction disorder, unspecified: Secondary | ICD-10-CM | POA: Diagnosis not present

## 2024-03-15 ENCOUNTER — Other Ambulatory Visit (HOSPITAL_COMMUNITY): Payer: Self-pay

## 2024-04-02 ENCOUNTER — Other Ambulatory Visit (HOSPITAL_COMMUNITY): Payer: Self-pay

## 2024-04-02 ENCOUNTER — Other Ambulatory Visit: Payer: Self-pay

## 2024-04-02 MED ORDER — INSULIN ASPART FLEXPEN 100 UNIT/ML ~~LOC~~ SOPN
55.0000 [IU] | PEN_INJECTOR | Freq: Two times a day (BID) | SUBCUTANEOUS | 3 refills | Status: AC
  Filled 2024-04-02: qty 45, 41d supply, fill #0

## 2024-04-19 ENCOUNTER — Encounter (HOSPITAL_BASED_OUTPATIENT_CLINIC_OR_DEPARTMENT_OTHER): Payer: Self-pay | Admitting: Cardiovascular Disease

## 2024-04-19 ENCOUNTER — Ambulatory Visit (INDEPENDENT_AMBULATORY_CARE_PROVIDER_SITE_OTHER): Admitting: Cardiovascular Disease

## 2024-04-19 VITALS — BP 118/72 | HR 85 | Ht 67.0 in | Wt 293.0 lb

## 2024-04-19 DIAGNOSIS — E7800 Pure hypercholesterolemia, unspecified: Secondary | ICD-10-CM | POA: Diagnosis not present

## 2024-04-19 DIAGNOSIS — I4892 Unspecified atrial flutter: Secondary | ICD-10-CM | POA: Diagnosis not present

## 2024-04-19 DIAGNOSIS — G4733 Obstructive sleep apnea (adult) (pediatric): Secondary | ICD-10-CM | POA: Diagnosis not present

## 2024-04-19 DIAGNOSIS — I872 Venous insufficiency (chronic) (peripheral): Secondary | ICD-10-CM | POA: Diagnosis not present

## 2024-04-19 DIAGNOSIS — I483 Typical atrial flutter: Secondary | ICD-10-CM | POA: Insufficient documentation

## 2024-04-19 DIAGNOSIS — I700 Atherosclerosis of aorta: Secondary | ICD-10-CM | POA: Diagnosis not present

## 2024-04-19 NOTE — Progress Notes (Signed)
 " Cardiology Office Note:  .   Date:  04/19/2024  ID:  [name], DOB 1950/07/25, MRN [id number] PCP: Nichole Senior, MD  [facility] Providers Cardiologist:  None    History of Present Illness: .    [name] is a 74 y.o. female with atrial flutter, hypertension, diabetes, hyperlipidemia, aortic atherosclerosis, OSA on CPAP, endometrial cancer, morbid obesity, right bundle branch block, LAFB here for evaluation of palpitations.  She wore an ambulatory monitor 03/2024 that revealed sinus rhythm and an episode of atrial flutter lasting less than 10 minutes.  13 episodes of NSVT lasting up to 10 beats.  She was previously saw Dr. Erna, cardiologist at [facility], in 2021 due to EKG abnormalities.  EKG revealed LBBB and LAFB.  She had no symptoms.  She had an echocardiogram that revealed LVEF greater than 70% and moderate LVH.  Discussed the use of AI scribe software for clinical note transcription with the patient, who gave verbal consent to proceed.  History of Present Illness [name] is a 74 year old female with atrial flutter who presents with concerns of heart rhythm irregularities.  In October, she noticed her pulse was irregular, described as 'skipping some beats,' which led her to cancel a trip to [facility]. She subsequently visited her primary care physician, who performed an EKG and ordered a two-week Holter monitor. She has not received the results from this test but reports ongoing irregular heartbeats. She describes the irregularity as intermittent, with periods of normal rhythm followed by skipped beats. No associated symptoms such as chest pain or pressure during these episodes.  She has a history of a fall on October 11th, which resulted in a knee injury, a black eye, and bruising. She did not lose consciousness during the fall and attributes it to possibly tripping. She has experienced falls in the past, which she associates with dehydration during  weight loss attempts. She is currently undergoing physical therapy twice a week for her knee and is participating in a weight management program at [facility].  Her current medications include amlodipine  2.5 mg for blood pressure, which was prescribed after her blood pressure was noted to be slightly elevated. She also takes a baby aspirin, atorvastatin , and Synthroid  200 mcg daily. She uses Lasix  as needed but has not taken it in several months.  Ms. Easterwood uses a CPAP machine regularly for sleep apnea and finds it beneficial. She is actively trying to lose weight and improve her diet, focusing on reducing sugar and carbohydrates to manage insulin  levels and visceral fat. She drinks decaffeinated coffee and does not consume alcohol. She reports no headaches and has a history of a normal echocardiogram in 2021 with moderate left ventricular hypertrophy.  ROS:  As per HPI  Studies Reviewed: SABRA   EKG Interpretation Date/Time:  Thursday April 19 2024 13:59:29 EST Ventricular Rate:  90 PR Interval:  168 QRS Duration:  140 QT Interval:  408 QTC Calculation: 499 R Axis:   -35  Text Interpretation: Sinus rhythm with Premature atrial complexes Left axis deviation Right bundle branch block When compared with ECG of 13-Dec-2006 08:24, Premature atrial complexes are now Present QRS axis Shifted left Confirmed by Raford Riggs ([id number]) on 04/19/2024 2:01:20 PM   14 Day Zio Monitor 03/2024: NSR with sinus brady (45/min) and sinus tachy (113/min), ave 79/min. Paroxysmal atrial flutter present but duration unknown due to noise artifact but VR 77-98/min. Longest episode appeared to be less than 10  minutes. No prolonged pauses 13 episodes of NS SVT, longest 10 beats at 124/min No VT or atrial fib No symptoms.   Coronary calcium  score 06/2022: IMPRESSION: 1.  Coronary calcium  score of 0.  Low risk study.   2.  Moderate mitral annular calcification.   3.  Aortic atherosclerosis.   Risk  Assessment/Calculations:    CHA2DS2-VASc Score =     This indicates a  % annual risk of stroke. The patient's score is based upon:              Physical Exam:   VS:  BP 118/72   Pulse 85   Ht 5' 7 (1.702 m)   Wt 293 lb (132.9 kg)   SpO2 97%   BMI 45.89 kg/m  , BMI Body mass index is 45.89 kg/m. GENERAL:  Well appearing HEENT: Pupils equal round and reactive, fundi not visualized, oral mucosa unremarkable NECK:  No jugular venous distention, waveform within normal limits, carotid upstroke brisk and symmetric, no bruits, no thyromegaly LUNGS:  Clear to auscultation bilaterally HEART:  RRR.  PMI not displaced or sustained,S1 and S2 within normal limits, no S3, no S4, no clicks, no rubs, no murmurs ABD:  Central apiposity, positive bowel sounds normal in frequency in pitch, no bruits, no rebound, no guarding, no midline pulsatile mass, no hepatomegaly, no splenomegaly EXT:  2 plus pulses throughout, no edema, no cyanosis no clubbing SKIN:  No rashes no nodules NEURO:  Cranial nerves II through XII grossly intact, motor grossly intact throughout PSYCH:  Cognitively intact, oriented to person place and time   ASSESSMENT AND PLAN: .    Assessment & Plan # Atrial flutter: Brief episode of atrial flutter was noted on her ambulatory monitor. She was asymptomatic.  This is subclinical atrial fibrillation.  Stroke risk is low.  We will get a 30 day monitor to see if she has having longer episodes.  Continue aspirin for now.  Heart rate was <100 bpm when in atrial flutter.  - Check echo - Continue CPAP and levothyroxine . - Continue working on weight loss.   # SVT:  Brief episodes on monitor.  Consider beta blocker if she develops longer and symptomatic episodes.   # Left ventricular hypertrophy Moderate LVH likely due to hypertension. Weight may affect diastolic function but not LVH. - Ordered echocardiogram to reassess LVH and cardiac function.  # Aortic atherosclerosis Aortic  atherosclerosis present without coronary artery calcification. Managed with cholesterol-lowering medication and aspirin. - Continue current cholesterol-lowering medication and aspirin regimen.  # Obstructive sleep apnea Managed with consistent CPAP use. - Continue CPAP therapy.  # Pure hypercholesterolemia Cholesterol levels satisfactory as of October 2024. Managed with cholesterol-lowering medication. - Continue current cholesterol-lowering medication. - Review upcoming lab results from primary care provider.  # Hypothyroidism Managed with Synthroid . TSH slightly above target, pending endocrinologist's dosage adjustment. - Continue current Synthroid  regimen. - Follow up with endocrinologist for potential dosage adjustment.   Dispo: f/u 2-3 months  Signed, Annabella Scarce, MD   "

## 2024-04-19 NOTE — Patient Instructions (Addendum)
 Medication Instructions:  Your physician recommends that you continue on your current medications as directed. Please refer to the Current Medication list given to you today.   *If you need a refill on your cardiac medications before your next appointment, please call your pharmacy*  Lab Work: NONE   Testing/Procedures: Your physician has requested that you have an echocardiogram. Echocardiography is a painless test that uses sound waves to create images of your heart. It provides your doctor with information about the size and shape of your heart and how well your hearts chambers and valves are working. This procedure takes approximately one hour. There are no restrictions for this procedure. Please do NOT wear cologne, perfume, aftershave, or lotions (deodorant is allowed). Please arrive 15 minutes prior to your appointment time.  Please note: We ask at that you not bring children with you during ultrasound (echo/ vascular) testing. Due to room size and safety concerns, children are not allowed in the ultrasound rooms during exams. Our front office staff cannot provide observation of children in our lobby area while testing is being conducted. An adult accompanying a patient to their appointment will only be allowed in the ultrasound room at the discretion of the ultrasound technician under special circumstances. We apologize for any inconvenience.   30 day event monitor   Follow-Up: At [facility], you and your health needs are our priority.  As part of our continuing mission to provide you with exceptional heart care, our providers are all part of one team.  This team includes your primary Cardiologist (physician) and Advanced Practice Providers or APPs (Physician Assistants and Nurse Practitioners) who all work together to provide you with the care you need, when you need it.  Your next appointment:   2 to 3 month(s)  Provider:   Annabella Scarce, MD, Reche ORN NP, or Rosaline RAMAN NP    We recommend signing up for the patient portal called MyChart.  Sign up information is provided on this After Visit Summary.  MyChart is used to connect with patients for Virtual Visits (Telemedicine).  Patients are able to view lab/test results, encounter notes, upcoming appointments, etc.  Non-urgent messages can be sent to your provider as well.   To learn more about what you can do with MyChart, go to [email or web address].   Other Instructions [employer] Cardiac Event Monitor Instructions  Your physician has requested you wear your cardiac event monitor for __30___ days, (1-30). [employer] may call or text to confirm a shipping address. The monitor will be sent to a land address via UPS. [employer] will not ship a monitor to a PO BOX. It typically takes 3-5 days to receive your monitor after it has been enrolled. [employer] will assist with USPS tracking if your package is delayed. The telephone number for [employer] is [phone]. Once you have received your monitor, please review the enclosed instructions. Instruction tutorials can also be viewed under help and settings on the enclosed cell phone. Your monitor has already been registered assigning a specific monitor serial # to you.  Billing and Self Pay Discount Information  [employer] has been provided the insurance information we had on file for you.  If your insurance has been updated, please call [employer] at [phone] to provide them with your updated insurance information.   [employer] offers a discounted Self Pay option for patients who have insurance that does not cover their cardiac event monitor or patients without insurance.  The discounted cost of a Self Pay Cardiac Event  Monitor would be $225.00 , if the patient contacts [employer] at [phone] within 7 days of applying the monitor to make payment arrangements.  If the patient does not contact [employer] within 7 days of applying the monitor, the cost  of the cardiac event monitor will be $350.00.  Applying the monitor  Remove cell phone from case and turn it on. The cell phone works as it consultant and needs to be within [employer] of you at all times. The cell phone will need to be charged on a daily basis. We recommend you plug the cell phone into the enclosed charger at your bedside table every night.  Monitor batteries: You will receive two monitor batteries labelled #1 and #2. These are your recorders. Plug battery #2 onto the second connection on the enclosed charger. Keep one battery on the charger at all times. This will keep the monitor battery deactivated. It will also keep it fully charged for when you need to switch your monitor batteries. A small light will be blinking on the battery emblem when it is charging. The light on the battery emblem will remain on when the battery is fully charged.  Open package of a Monitor strip. Insert battery #1 into black hood on strip and gently squeeze monitor battery onto connection as indicated in instruction booklet. Set aside while preparing skin.  Choose location for your strip, vertical or horizontal, as indicated in the instruction booklet. Shave to remove all hair from location. There cannot be any lotions, oils, powders, or colognes on skin where monitor is to be applied. Wipe skin clean with enclosed Saline wipe. Dry skin completely.  Peel paper labeled #1 off the back of the Monitor strip exposing the adhesive. Place the monitor on the chest in the vertical or horizontal position shown in the instruction booklet. One arrow on the monitor strip must be pointing upward. Carefully remove paper labeled #2, attaching remainder of strip to your skin. Try not to create any folds or wrinkles in the strip as you apply it.  Firmly press and release the circle in the center of the monitor battery. You will hear a small beep. This is turning the monitor battery on. The heart emblem on the  monitor battery will light up every 5 seconds if the monitor battery in turned on and connected to the patient securely. Do not push and hold the circle down as this turns the monitor battery off. The cell phone will locate the monitor battery. A screen will appear on the cell phone checking the connection of your monitor strip. This may read poor connection initially but change to good connection within the next minute. Once your monitor accepts the connection you will hear a series of 3 beeps followed by a climbing crescendo of beeps. A screen will appear on the cell phone showing the two monitor strip placement options. Touch the picture that demonstrates where you applied the monitor strip.  Your monitor strip and battery are waterproof. You are able to shower, bathe, or swim with the monitor on. They just ask you do not submerge deeper than 3 feet underwater. We recommend removing the monitor if you are swimming in a lake, river, or ocean.  Your monitor battery will need to be switched to a fully charged monitor battery approximately once a week. The cell phone will alert you of an action which needs to be made.  On the cell phone, tap for details to reveal connection status, monitor battery status, and  cell phone battery status. The green dots indicates your monitor is in good status. A red dot indicates there is something that needs your attention.  To record a symptom, click the circle on the monitor battery. In 30-60 seconds a list of symptoms will appear on the cell phone. Select your symptom and tap save. Your monitor will record a sustained or significant arrhythmia regardless of you clicking the button. Some patients do not feel the heart rhythm irregularities. [employer] will notify us  of any serious or critical events.  Refer to instruction booklet for instructions on switching batteries, changing strips, the Do not disturb or Pause features, or any additional  questions.  Call [employer] at [phone], to confirm your monitor is transmitting and record your baseline. They will answer any questions you may have regarding the monitor instructions at that time.  Returning the monitor to [employer]  Place all equipment back into blue box. Peel off strip of paper to expose adhesive and close box securely. There is a prepaid UPS shipping label on this box. Drop in a UPS drop box, or at a UPS facility like [employer]. You may also contact [employer] to arrange UPS to pick up monitor package at your home.

## 2024-04-26 ENCOUNTER — Other Ambulatory Visit (HOSPITAL_COMMUNITY): Payer: Self-pay

## 2024-05-17 ENCOUNTER — Other Ambulatory Visit (HOSPITAL_BASED_OUTPATIENT_CLINIC_OR_DEPARTMENT_OTHER)

## 2024-05-18 ENCOUNTER — Other Ambulatory Visit (HOSPITAL_BASED_OUTPATIENT_CLINIC_OR_DEPARTMENT_OTHER)

## 2024-07-23 ENCOUNTER — Ambulatory Visit (HOSPITAL_BASED_OUTPATIENT_CLINIC_OR_DEPARTMENT_OTHER): Admitting: Cardiovascular Disease
